# Patient Record
Sex: Female | Born: 1940
Health system: Southern US, Community
[De-identification: ages and names within clinical notes are randomized; demographics above are authoritative.]

## PROBLEM LIST (undated history)

## (undated) DIAGNOSIS — Z950 Presence of cardiac pacemaker: Secondary | ICD-10-CM

## (undated) DIAGNOSIS — I495 Sick sinus syndrome: Secondary | ICD-10-CM

## (undated) DIAGNOSIS — I10 Essential (primary) hypertension: Secondary | ICD-10-CM

## (undated) DIAGNOSIS — G4730 Sleep apnea, unspecified: Secondary | ICD-10-CM

## (undated) DIAGNOSIS — F0390 Unspecified dementia without behavioral disturbance: Secondary | ICD-10-CM

## (undated) DIAGNOSIS — K219 Gastro-esophageal reflux disease without esophagitis: Secondary | ICD-10-CM

## (undated) DIAGNOSIS — I509 Heart failure, unspecified: Secondary | ICD-10-CM

## (undated) DIAGNOSIS — I739 Peripheral vascular disease, unspecified: Secondary | ICD-10-CM

## (undated) DIAGNOSIS — J449 Chronic obstructive pulmonary disease, unspecified: Secondary | ICD-10-CM

## (undated) DIAGNOSIS — E785 Hyperlipidemia, unspecified: Secondary | ICD-10-CM

## (undated) DIAGNOSIS — C439 Malignant melanoma of skin, unspecified: Secondary | ICD-10-CM

## (undated) DIAGNOSIS — I4891 Unspecified atrial fibrillation: Secondary | ICD-10-CM

## (undated) DIAGNOSIS — E119 Type 2 diabetes mellitus without complications: Secondary | ICD-10-CM

## (undated) DIAGNOSIS — J45909 Unspecified asthma, uncomplicated: Secondary | ICD-10-CM

## (undated) DIAGNOSIS — Z89511 Acquired absence of right leg below knee: Secondary | ICD-10-CM

---

## 2004-09-12 ENCOUNTER — Ambulatory Visit (HOSPITAL_COMMUNITY): Admission: RE | Admit: 2004-09-12 | Discharge: 2004-09-12 | Payer: Self-pay | Admitting: Vascular Surgery

## 2004-10-18 ENCOUNTER — Encounter: Admission: RE | Admit: 2004-10-18 | Discharge: 2004-10-18 | Payer: Self-pay | Admitting: Vascular Surgery

## 2004-10-24 ENCOUNTER — Ambulatory Visit (HOSPITAL_COMMUNITY): Admission: RE | Admit: 2004-10-24 | Discharge: 2004-10-24 | Payer: Self-pay | Admitting: Vascular Surgery

## 2006-10-17 ENCOUNTER — Ambulatory Visit: Payer: Self-pay | Admitting: Vascular Surgery

## 2007-04-17 ENCOUNTER — Ambulatory Visit: Payer: Self-pay | Admitting: Vascular Surgery

## 2007-10-16 ENCOUNTER — Ambulatory Visit: Payer: Self-pay | Admitting: Vascular Surgery

## 2009-05-24 DIAGNOSIS — J45909 Unspecified asthma, uncomplicated: Secondary | ICD-10-CM

## 2009-05-24 DIAGNOSIS — F329 Major depressive disorder, single episode, unspecified: Secondary | ICD-10-CM

## 2009-05-24 DIAGNOSIS — G4700 Insomnia, unspecified: Secondary | ICD-10-CM

## 2009-05-24 DIAGNOSIS — M810 Age-related osteoporosis without current pathological fracture: Secondary | ICD-10-CM | POA: Insufficient documentation

## 2009-05-24 DIAGNOSIS — I739 Peripheral vascular disease, unspecified: Secondary | ICD-10-CM

## 2009-05-24 DIAGNOSIS — M1990 Unspecified osteoarthritis, unspecified site: Secondary | ICD-10-CM

## 2009-05-24 DIAGNOSIS — N183 Chronic kidney disease, stage 3 (moderate): Secondary | ICD-10-CM

## 2009-05-26 DIAGNOSIS — E119 Type 2 diabetes mellitus without complications: Secondary | ICD-10-CM

## 2009-06-01 DIAGNOSIS — R946 Abnormal results of thyroid function studies: Secondary | ICD-10-CM

## 2009-06-01 DIAGNOSIS — E559 Vitamin D deficiency, unspecified: Secondary | ICD-10-CM

## 2009-06-05 DIAGNOSIS — F411 Generalized anxiety disorder: Secondary | ICD-10-CM | POA: Insufficient documentation

## 2009-08-17 DIAGNOSIS — I150 Renovascular hypertension: Secondary | ICD-10-CM

## 2009-08-17 DIAGNOSIS — M129 Arthropathy, unspecified: Secondary | ICD-10-CM | POA: Insufficient documentation

## 2009-08-17 DIAGNOSIS — N3946 Mixed incontinence: Secondary | ICD-10-CM | POA: Insufficient documentation

## 2010-05-29 NOTE — Assessment & Plan Note (Signed)
OFFICE VISIT   [name], [name]  DOB:  [birth year]                                       10/17/2006  ZOXWR#:[id number]   Sarah Moss presents today for continued followup of diffuse  progressive occlusive disease.  She is status post prior left iliac  angioplasty, and is also status post right renal artery angioplasty and  stenting in 10/2004.  She had 1 subsequent duplex of the renal arteries  at [facility] showing no significant restenosis.  She does report that  she has occasional hypertension, but is usually under pretty good  control.  She does have continued problem with degenerative disk disease  in her back which limits her walk ability, and she is also status post  amputation on the right from trauma.   PHYSICAL EXAMINATION:  Abdomen is benign with no tenderness and no  masses.  She does have well-perfused left foot with palpable dorsalis  pedis pulse.   She underwent lower extremity noninvasive study showing AB  index at  0.92 with good wave form, biphasic in her posterior tibial and dorsalis  pedis, and I explained that this demonstrates that she does have  continued patency of her left iliac stent.  On renal duplex, she did  have a complete occlusion of her right renal artery with no flow noted  in her kidney.  She just has some mild to moderate stenosis in her left  renal artery with no critical flow disturbance.  Discussed this with Sarah Moss.  I explained that there is no treatment for occluded right  renal artery.  Her renal size prior to renal angioplasty was 9 cm and  her renal size on the right today is 8.7 cm.  She  has been scheduled to see renal for renal evaluation, and I plan to see  her again in 1 year with repeat renal artery duplex of her left kidney.   Sarah Moss, M.D.  Electronically Signed   TFE/MEDQ  D:  10/17/2006  T:  10/20/2006  Job:  [id number]   cc:   Lucianne Lei, MD

## 2010-05-29 NOTE — Procedures (Signed)
RENAL ARTERY DUPLEX EVALUATION   INDICATION:  Follow up renal artery disease.   HISTORY:  Diabetes:  Yes.  Cardiac:  No.  Hypertension:  Yes.  Smoking:  Previous.   RENAL ARTERY DUPLEX FINDINGS:  Aorta-Proximal:  43 cm/s  Aorta-Mid:  Not visualized  Aorta-Distal:  50 cm/s  Celiac Artery Origin:  Not visualized  SMA Origin:  Not visualized                                    RIGHT               LEFT  Renal Artery Origin:             Not visualized      Not visualized  Renal Artery Proximal:           Not visualized      Not visualized  Renal Artery Mid:                No flow detected    84 cm/s  Renal Artery Distal:             No flow detected    81 cm/s  Hilar Acceleration Time (AT):  Renal-Aortic Ratio (RAR):                            1.95  Kidney Size:                     8.5 cm              12.1 cm  End Diastolic Ratio (EDR):  Resistive Index (RI):            0.95                0.82   IMPRESSION:  1. No flow was adequately visualized in the right renal artery;      however, flow was noted within the right kidney via possible      collateral circulation.  2. No focal increase in Doppler velocities were detected in the      visualized portion of the left renal artery, however a left      proximal renal artery velocity of 208 cm/s was visualized on the      previous exam on 10/17/2006.  3. Elevated bilateral resistive indices are noted within the kidneys.  4. The left kidney size appears within normal limits with the right      kidney appearing atrophied.  5. Very limited visualization of the abdominal vasculature due to      extensive overlying bowel gas patterns and patient body habitus.   ___________________________________________  [name], M.D.   CH/MEDQ  D:  10/16/2007  T:  10/17/2007  Job:  [id number]

## 2010-05-29 NOTE — Assessment & Plan Note (Signed)
OFFICE VISIT   CRYSTALL, DONALDSON A  DOB:  05/30/1940                                       10/16/2007  QMVHQ#:[id number]   The patient presents today for continued followup of her diffuse  peripheral vascular occlusive disease.  She reports that she has had  back surgery since my last visit with her and has had a good result with  this.  She has had no significant symptoms of left leg claudication.  She reports that she does not know her renal function status but has had  recent reports and was not told of any worsening.  She is here today for  renal duplex.   On physical exam blood pressure is 178/100, pulse 78, respirations 18.  Her radial pulses are 2+ bilaterally.  She does have a right leg  amputation and the left leg is well-perfused.   Her renal artery duplex today reveals known occlusion of her right renal  artery.  Left renal artery shows no evidence of increased velocities  suggesting no evidence of stenosis..  I discussed this with the patient  and we will see her again in 1 year for continued followup.   [name], M.D.  Electronically Signed   TFE/MEDQ  D:  10/16/2007  T:  10/17/2007  Job:  [id number]   cc:   Lucianne Lei, MD  [facility]

## 2010-05-29 NOTE — Procedures (Signed)
RENAL ARTERY DUPLEX EVALUATION   INDICATION:  Follow up known right renal artery stent.   HISTORY:  Diabetes:  Yes.  Cardiac:  Yes.  Hypertension:  Yes.  Smoking:  Quit.   RENAL ARTERY DUPLEX FINDINGS:  Aorta-Proximal:  63 cm/s  Aorta-Mid:  87 cm/s  Aorta-Distal:  Celiac Artery Origin:  Not well visualized  SMA Origin:  Not well visualized                                    RIGHT               LEFT  Renal Artery Origin:             No flow noted       Not well visualized  Renal Artery Proximal:           No flow noted       208 cm/s  Renal Artery Mid:                No flow noted       76 cm/s  Renal Artery Distal:             No flow noted       67 cm/s  Hilar Acceleration Time (AT):  Renal-Aortic Ratio (RAR):                            3.3  Kidney Size:                     8.87 cm X 12.8 cm  End Diastolic Ratio (EDR):  Resistive Index (RI):                                0.60   IMPRESSION:  1. No flow noted in the right kidney, status post right renal artery      stent.  2. Right kidney appears atrophied, measuring 8.67 cm.  3. Less than 60% stenosis noted in the left renal artery.  4. Normal resistive index noted in the left kidney.  5. Unable to visualize celiac artery, superior mesenteric artery, and      distal aorta due to body habitus.   ___________________________________________  [name], M.D.   MG/MEDQ  D:  10/17/2006  T:  10/18/2006  Job:  [id number]

## 2010-06-01 NOTE — Op Note (Signed)
Sarah Moss, Sarah Moss              ACCOUNT NO.:  [id number]   MEDICAL RECORD NO.:  [id number]          PATIENT TYPE:  AMB   LOCATION:  SDS                          FACILITY:  [facility]   PHYSICIAN:  [name], M.D.    DATE OF BIRTH:  09/27/1940   DATE OF PROCEDURE:  10/24/2004  DATE OF DISCHARGE:                                 OPERATIVE REPORT   PREOPERATIVE DIAGNOSIS:  Severe right renal artery stenosis.   POSTOPERATIVE DIAGNOSIS:  Severe right renal artery stenosis.   PROCEDURE:  Right renal artery angioplasty and PALMAZ stent placement.   SURGEON:  [name], M.D.   ANESTHESIA:  1% lidocaine local.   COMPLICATIONS:  None.   DISPOSITION:  To recovery room, stable.   INDICATIONS FOR PROCEDURE:  The patient is 70 year old white female with  suspected renovascular hypertension.  She had undergone an MRA  preoperatively which had showed a significant stenosis in the right renal  artery.  She had also had left leg ischemia and several weeks prior to this  procedure, underwent successful left iliac artery angioplasty.  At that  procedure, the patient was found to have a severe stenosis in the right  renal artery, but appear to have possible small kidney size.  Therefore, she  underwent ultrasound as an outpatient and this revealed over a 9-cm right  kidney and a 12-cm left kidney; it was recommended that she undergo  angioplasty for preservation of renal function and for hopeful blood  pressure control.   PROCEDURE IN DETAIL:  The patient was taken to the peripheral vascular cath  lab and placed in supine position, where the area of the right groin was  prepped and draped in the usual sterile fashion.  Using local anesthesia and  a single-wall puncture, the right common femoral was entered and a guidewire  was passed up to the level of the suprarenal aorta.  A 6-French sheath was  passed over the guidewire.  Initially, a renal double-curve guide catheter  was positioned at  the level of the renal arteries and an 018 guidewire was  passed through this.  The patient had a small aorta and this did not allow  enough uncoiling of the guide catheter to access the somewhat down-pointing  right renal artery.  For this reason, this was exchanged for an IMA catheter  and the 018 was successfully passed across this.  Next, a 6-mm balloon and  15-mm-length PALMAZ stent was chosen and was positioned at the ostium of the  aorta.  This was inflated to 9 atmospheres.  Completion arteriogram revealed  excellent positioning with no evidence of residual stenosis.  The sheath was  removed after the ACT was normalized.  The patient was given 4000 units of  intravenous heparin.  The patient tolerated the procedure without immediate  complication and was transferred to the holding area in stable condition.   FINDINGS:  1.  Severe right renal artery stenosis.  2.  Successful angioplasty and PALMAZ stenting of severe right renal artery.      [name], M.D.  Electronically Signed  TFE/MEDQ  D:  10/24/2004  T:  10/24/2004  Job:  [id number]

## 2010-06-01 NOTE — Op Note (Signed)
Sarah Moss, Sarah Moss              ACCOUNT NO.:  [id number]   MEDICAL RECORD NO.:  [id number]          PATIENT TYPE:  AMB   LOCATION:  SDS                          FACILITY:  [facility]   PHYSICIAN:  [name], M.D.    DATE OF BIRTH:  25-Dec-1940   DATE OF PROCEDURE:  10/10/2004  DATE OF DISCHARGE:  09/12/2004                                 OPERATIVE REPORT   PREOPERATIVE DIAGNOSIS:  Recurrent left leg claudication symptoms.   POSTOPERATIVE DIAGNOSIS:  Recurrent left leg claudication symptoms.   PROCEDURE:  1.  Aortogram with left leg runoff.  2.  Redilatation of prior placed left common iliac artery stent.   SURGEON:  [name], M.D.   ASSISTANT:  Nurse.   ANESTHESIA:  1% lidocaine local.   COMPLICATIONS:  None.   DISPOSITION:  Holding area stable.   DESCRIPTION OF PROCEDURE:  The patient was taken peripheral vascular  catheterization lab and placed in supine position where the area both groins  were prepped and draped in usual sterile fashion. Using local anesthesia and  single wall puncture the left common femoral artery was entered. A Wholey  wire was passed up towards the bifurcation. The 5-French sheath was passed  over the guidewire. There was severe stenosis of the iliac system and a  Wholey wire was able to be positioned through the prior placed stent.  Pigtail catheter was positioned at the level of suprarenal aorta. AP  projection was undertaken. This revealed severe stenosis in the right renal  artery. No significant stenosis of the left renal artery. There was widely  patent iliac system on the right. The left room revealed a high-grade  restenosis of the stent placed in the common iliac artery just distal to the  bifurcation. A long 6-French sheath was passed to this area and the area was  redilated. Repeat study revealed excellent flow through this area. The left  leg runoff was then obtained through the same sheath revealing widely patent  superficial  femoral artery, widely patent popliteal artery, and three-vessel  distal runoff. The patient tolerated the procedure without immediate  complication and was transferred to the holding area in stable condition.      [name], M.D.  Electronically Signed     TFE/MEDQ  D:  10/10/2004  T:  10/11/2004  Job:  [id number]

## 2011-08-22 ENCOUNTER — Encounter: Payer: Self-pay | Admitting: Vascular Surgery

## 2011-10-28 DIAGNOSIS — R768 Other specified abnormal immunological findings in serum: Secondary | ICD-10-CM | POA: Insufficient documentation

## 2011-10-28 DIAGNOSIS — R7689 Other specified abnormal immunological findings in serum: Secondary | ICD-10-CM | POA: Insufficient documentation

## 2012-11-04 DIAGNOSIS — I10 Essential (primary) hypertension: Secondary | ICD-10-CM | POA: Insufficient documentation

## 2012-11-04 DIAGNOSIS — E1142 Type 2 diabetes mellitus with diabetic polyneuropathy: Secondary | ICD-10-CM | POA: Insufficient documentation

## 2012-11-04 DIAGNOSIS — E039 Hypothyroidism, unspecified: Secondary | ICD-10-CM | POA: Insufficient documentation

## 2014-05-30 DIAGNOSIS — I5032 Chronic diastolic (congestive) heart failure: Secondary | ICD-10-CM | POA: Insufficient documentation

## 2014-08-03 DIAGNOSIS — R0609 Other forms of dyspnea: Secondary | ICD-10-CM | POA: Insufficient documentation

## 2014-08-03 DIAGNOSIS — J309 Allergic rhinitis, unspecified: Secondary | ICD-10-CM | POA: Insufficient documentation

## 2014-08-03 DIAGNOSIS — G4730 Sleep apnea, unspecified: Secondary | ICD-10-CM | POA: Insufficient documentation

## 2014-08-03 DIAGNOSIS — I272 Pulmonary hypertension, unspecified: Secondary | ICD-10-CM | POA: Insufficient documentation

## 2014-09-14 DIAGNOSIS — Z7982 Long term (current) use of aspirin: Secondary | ICD-10-CM | POA: Insufficient documentation

## 2014-09-14 DIAGNOSIS — I10 Essential (primary) hypertension: Secondary | ICD-10-CM | POA: Insufficient documentation

## 2014-09-14 DIAGNOSIS — R0600 Dyspnea, unspecified: Secondary | ICD-10-CM | POA: Insufficient documentation

## 2014-10-12 DIAGNOSIS — IMO0001 Reserved for inherently not codable concepts without codable children: Secondary | ICD-10-CM | POA: Insufficient documentation

## 2014-12-19 DIAGNOSIS — I495 Sick sinus syndrome: Secondary | ICD-10-CM | POA: Insufficient documentation

## 2015-02-15 DIAGNOSIS — E118 Type 2 diabetes mellitus with unspecified complications: Secondary | ICD-10-CM | POA: Insufficient documentation

## 2015-02-20 DIAGNOSIS — L509 Urticaria, unspecified: Secondary | ICD-10-CM | POA: Insufficient documentation

## 2015-02-20 DIAGNOSIS — Z950 Presence of cardiac pacemaker: Secondary | ICD-10-CM | POA: Insufficient documentation

## 2015-02-21 DIAGNOSIS — Z9889 Other specified postprocedural states: Secondary | ICD-10-CM | POA: Insufficient documentation

## 2015-02-24 DIAGNOSIS — J90 Pleural effusion, not elsewhere classified: Secondary | ICD-10-CM | POA: Insufficient documentation

## 2016-03-05 ENCOUNTER — Encounter (HOSPITAL_COMMUNITY): Payer: Self-pay | Admitting: *Deleted

## 2016-03-05 ENCOUNTER — Emergency Department (HOSPITAL_COMMUNITY)
Admission: EM | Admit: 2016-03-05 | Discharge: 2016-03-05 | Disposition: A | Discharge status: Home / Self Care | Payer: Medicare HMO | Attending: Emergency Medicine | Admitting: Emergency Medicine

## 2016-03-05 ENCOUNTER — Emergency Department (HOSPITAL_COMMUNITY): Payer: Medicare HMO

## 2016-03-05 DIAGNOSIS — Z7984 Long term (current) use of oral hypoglycemic drugs: Secondary | ICD-10-CM | POA: Insufficient documentation

## 2016-03-05 DIAGNOSIS — J449 Chronic obstructive pulmonary disease, unspecified: Secondary | ICD-10-CM | POA: Insufficient documentation

## 2016-03-05 DIAGNOSIS — R0600 Dyspnea, unspecified: Secondary | ICD-10-CM | POA: Diagnosis present

## 2016-03-05 DIAGNOSIS — Z7901 Long term (current) use of anticoagulants: Secondary | ICD-10-CM | POA: Diagnosis not present

## 2016-03-05 DIAGNOSIS — I110 Hypertensive heart disease with heart failure: Secondary | ICD-10-CM | POA: Diagnosis not present

## 2016-03-05 DIAGNOSIS — Z87891 Personal history of nicotine dependence: Secondary | ICD-10-CM | POA: Diagnosis not present

## 2016-03-05 DIAGNOSIS — R600 Localized edema: Secondary | ICD-10-CM | POA: Insufficient documentation

## 2016-03-05 DIAGNOSIS — E119 Type 2 diabetes mellitus without complications: Secondary | ICD-10-CM | POA: Diagnosis not present

## 2016-03-05 DIAGNOSIS — I509 Heart failure, unspecified: Secondary | ICD-10-CM | POA: Insufficient documentation

## 2016-03-05 HISTORY — DX: Chronic obstructive pulmonary disease, unspecified: J44.9

## 2016-03-05 HISTORY — DX: Essential (primary) hypertension: I10

## 2016-03-05 HISTORY — DX: Unspecified asthma, uncomplicated: J45.909

## 2016-03-05 HISTORY — DX: Gastro-esophageal reflux disease without esophagitis: K21.9

## 2016-03-05 HISTORY — DX: Heart failure, unspecified: I50.9

## 2016-03-05 HISTORY — DX: Type 2 diabetes mellitus without complications: E11.9

## 2016-03-05 LAB — CBC WITH DIFFERENTIAL/PLATELET
Basophils Absolute: 0 10*3/uL (ref 0.0–0.1)
Basophils Relative: 1 %
Eosinophils Absolute: 0.1 10*3/uL (ref 0.0–0.7)
Eosinophils Relative: 2 %
HCT: 31 % — ABNORMAL LOW (ref 36.0–46.0)
Hemoglobin: 9.6 g/dL — ABNORMAL LOW (ref 12.0–15.0)
Lymphocytes Relative: 22 %
Lymphs Abs: 1.4 10*3/uL (ref 0.7–4.0)
MCH: 21.7 pg — ABNORMAL LOW (ref 26.0–34.0)
MCHC: 31 g/dL (ref 30.0–36.0)
MCV: 70.1 fL — ABNORMAL LOW (ref 78.0–100.0)
Monocytes Absolute: 0.9 10*3/uL (ref 0.1–1.0)
Monocytes Relative: 13 %
Neutro Abs: 4.1 10*3/uL (ref 1.7–7.7)
Neutrophils Relative %: 63 %
Platelets: 193 10*3/uL (ref 150–400)
RBC: 4.42 MIL/uL (ref 3.87–5.11)
RDW: 19 % — ABNORMAL HIGH (ref 11.5–15.5)
WBC: 6.6 10*3/uL (ref 4.0–10.5)

## 2016-03-05 LAB — URINALYSIS, MICROSCOPIC (REFLEX)

## 2016-03-05 LAB — URINALYSIS, ROUTINE W REFLEX MICROSCOPIC
Bilirubin Urine: NEGATIVE
Glucose, UA: NEGATIVE mg/dL
Ketones, ur: NEGATIVE mg/dL
Nitrite: NEGATIVE
Protein, ur: 30 mg/dL — AB
Specific Gravity, Urine: 1.02 (ref 1.005–1.030)
pH: 6 (ref 5.0–8.0)

## 2016-03-05 LAB — COMPREHENSIVE METABOLIC PANEL
ALT: 13 U/L — ABNORMAL LOW (ref 14–54)
AST: 25 U/L (ref 15–41)
Albumin: 3.3 g/dL — ABNORMAL LOW (ref 3.5–5.0)
Alkaline Phosphatase: 56 U/L (ref 38–126)
Anion gap: 13 (ref 5–15)
BUN: 20 mg/dL (ref 6–20)
CO2: 22 mmol/L (ref 22–32)
Calcium: 9.2 mg/dL (ref 8.9–10.3)
Chloride: 106 mmol/L (ref 101–111)
Creatinine, Ser: 0.97 mg/dL (ref 0.44–1.00)
GFR calc Af Amer: >60 mL/min (ref >60)
GFR calc non Af Amer: 55 mL/min — ABNORMAL LOW (ref >60)
Glucose, Bld: 130 mg/dL — ABNORMAL HIGH (ref 65–99)
Potassium: 5.4 mmol/L — ABNORMAL HIGH (ref 3.5–5.1)
Sodium: 141 mmol/L (ref 135–145)
Total Bilirubin: 0.6 mg/dL (ref 0.3–1.2)
Total Protein: 6.2 g/dL — ABNORMAL LOW (ref 6.5–8.1)

## 2016-03-05 LAB — BRAIN NATRIURETIC PEPTIDE: B Natriuretic Peptide: 842 pg/mL — ABNORMAL HIGH (ref 0.0–100.0)

## 2016-03-05 LAB — PROTIME-INR
INR: 2.84
Prothrombin Time: 30.4 seconds — ABNORMAL HIGH (ref 11.4–15.2)

## 2016-03-05 LAB — LIPASE, BLOOD: Lipase: 41 U/L (ref 11–51)

## 2016-03-05 LAB — TROPONIN I: Troponin I: <0.03 ng/mL (ref <0.03)

## 2016-03-05 MED ORDER — ALBUTEROL SULFATE (2.5 MG/3ML) 0.083% IN NEBU
2.5000 mg | INHALATION_SOLUTION | Freq: Once | RESPIRATORY_TRACT | Status: AC
Start: 2016-03-05 — End: 2016-03-05
  Administered 2016-03-05: 2.5 mg via RESPIRATORY_TRACT
  Filled 2016-03-05: qty 3

## 2016-03-05 MED ORDER — FUROSEMIDE 10 MG/ML IJ SOLN
60.0000 mg | Freq: Once | INTRAMUSCULAR | Status: AC
  Administered 2016-03-05: 60 mg via INTRAVENOUS
  Filled 2016-03-05: qty 6

## 2016-03-05 NOTE — ED Notes (Signed)
Patient family requesting to speak with social work. Put back in process

## 2016-03-05 NOTE — Discharge Instructions (Addendum)
Increase lasix to 40mg  twice a day for the next 3 days.

## 2016-03-05 NOTE — ED Notes (Signed)
Patient at xray

## 2016-03-05 NOTE — ED Triage Notes (Signed)
Patient comes in per [facility] with SOB. Fm home. sob for about 1wk. 2 days ago patient felt chest pain. No pain today. Sob with exertion. On 2L O2 for comfort. 96% on RA. ekg paced rhythm. Ems v/s 138/64, HR 76, 20 RR. Hx of DM, asthma, anxiety, ckd, chf, htn, pacemaker. No IV per EMS.

## 2016-03-05 NOTE — ED Notes (Signed)
Papers reviewed with patient and friend at bedside. They verbalize understanding
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
## 2016-03-05 NOTE — ED Provider Notes (Signed)
[place] DEPT Provider Note   CSN: [id number] Arrival date & time: 03/05/16  0710     History   Chief Complaint No chief complaint on file.   HPI Sarah Moss is a 76 y.o. female.  HPI    76 y.o. F with HTN, DM, A-fib with tachy brady s/p PPM by [facility], diastolic HF, COPD and right BKA from an accident presenting with dyspnea. History primarily from her sister whom she lives with. Worsening dyspnea over the past ~5 days. Increasing orthopnea. Decreasing functional capacity.  Her sister says her face looks puffy and her abdomen seems bloated. The patient has been complaining of epigastric/lower sternal pain for the past several days. She has a hard to describing it further but does says it's been constant. She is anticoagulated of warfarin. She denies BRBPR or melena. She denies hx of PUD. Does report "reflux" but not on a PPI.   She has had numerous previous admission for dyspnea, mostly related to HF. She previously lived in [place] until moving in with her sister and most prior care has been through [place]. Cardiology through [place].  There have been compliance problems previously and social concerns. Her sister is currently in charge of administering her medications and reports compliance.     Past Medical History:  Diagnosis Date  . Asthma   . CHF (congestive heart failure) ([place])   . COPD (chronic obstructive pulmonary disease) ([place])   . Diabetes mellitus without complication ([place])   . GERD (gastroesophageal reflux disease)   . Hypertension     There are no active problems to display for this patient.   No past surgical history on file.  OB History    No data available       Home Medications    Prior to Admission medications   Medication Sig Start Date End Date Taking? Authorizing Provider  ALPRAZolam (XANAX) 0.25 MG tablet Take 0.25 mg by mouth every 12 (twelve) hours as needed for anxiety.   Yes Historical Provider, MD    atenolol (TENORMIN) 25 MG tablet Take 25 mg by mouth daily. 02/22/15  Yes Historical Provider, MD  atorvastatin (LIPITOR) 40 MG tablet Take 40 mg by mouth daily. 05/30/14  Yes Historical Provider, MD  diltiazem (TIAZAC) 120 MG 24 hr capsule Take 120 mg by mouth daily. 02/22/15  Yes Historical Provider, MD  DULoxetine (CYMBALTA) 60 MG capsule Take 60 mg by mouth daily.   Yes Historical Provider, MD  furosemide (LASIX) 40 MG tablet Take 40 mg by mouth daily.   Yes Historical Provider, MD  gabapentin (NEURONTIN) 300 MG capsule Take 300 mg by mouth at bedtime. 05/30/14  Yes Historical Provider, MD  levothyroxine (SYNTHROID, LEVOTHROID) 25 MCG tablet Take 25 mcg by mouth daily. 05/30/14  Yes Historical Provider, MD  lisinopril (PRINIVIL,ZESTRIL) 20 MG tablet Take 20 mg by mouth daily.   Yes Historical Provider, MD  metFORMIN (GLUCOPHAGE) 1000 MG tablet Take 1,000 mg by mouth 2 (two) times daily. 05/30/14  Yes Historical Provider, MD  montelukast (SINGULAIR) 10 MG tablet Take 10 mg by mouth at bedtime.   Yes Historical Provider, MD  omeprazole (PRILOSEC) 40 MG capsule Take 40 mg by mouth daily.   Yes Historical Provider, MD  potassium chloride (K-DUR) 10 MEQ tablet Take 10 mEq by mouth 2 (two) times daily. For 30 days 02/12/16  Yes Historical Provider, MD  warfarin (COUMADIN) 4 MG tablet Take 4-6 mg by mouth as directed. Per Warfarin sheet 07/04/15 07/03/16  Yes Historical Provider, MD  nitroGLYCERIN (NITROSTAT) 0.4 MG SL tablet Place 0.4 mg under the tongue every 5 (five) minutes as needed for chest pain.    Historical Provider, MD    Family History No family history on file.  Social History Social History  Substance Use Topics  . Smoking status: Former Research scientist (life sciences)  . Smokeless tobacco: Never Used  . Alcohol use Not on file     Allergies   Codeine; Ambien [zolpidem tartrate]; and Ambien  [zolpidem]   Review of Systems Review of Systems  All systems reviewed and negative, other than as noted in  HPI.   Physical Exam Updated Vital Signs BP 147/82   Pulse 79   Temp 98.3 F (36.8 C) (Oral)   Resp 17   Ht 5\' 5"  (1.651 m)   Wt 142 lb (64.4 kg)   SpO2 99%   BMI 23.63 kg/m   Physical Exam  Constitutional: She appears well-developed and well-nourished. No distress.  HENT:  Head: Normocephalic and atraumatic.  Eyes: Conjunctivae are normal. Right eye exhibits no discharge. Left eye exhibits no discharge.  Neck: Neck supple.  Cardiovascular: Normal rate and regular rhythm.  Exam reveals no gallop and no friction rub.   Murmur heard. Systolic murmur  Pulmonary/Chest: Effort normal and breath sounds normal. No respiratory distress.  Abdominal: Soft. She exhibits no distension. There is no tenderness.  Musculoskeletal: She exhibits edema. She exhibits no tenderness.  R BKA  Neurological: She is alert.  Skin: Skin is warm and dry.  Psychiatric: She has a normal mood and affect. Her behavior is normal. Thought content normal.  Nursing note and vitals reviewed.    ED Treatments / Results  Labs (all labs ordered are listed, but only abnormal results are displayed) Labs Reviewed  CBC WITH DIFFERENTIAL/PLATELET - Abnormal; Notable for the following:       Result Value   Hemoglobin 9.6 (*)    HCT 31.0 (*)    MCV 70.1 (*)    MCH 21.7 (*)    RDW 19.0 (*)    All other components within normal limits  BRAIN NATRIURETIC PEPTIDE - Abnormal; Notable for the following:    B Natriuretic Peptide 842.0 (*)    All other components within normal limits  URINALYSIS, ROUTINE W REFLEX MICROSCOPIC - Abnormal; Notable for the following:    APPearance HAZY (*)    Hgb urine dipstick TRACE (*)    Protein, ur 30 (*)    Leukocytes, UA SMALL (*)    All other components within normal limits  COMPREHENSIVE METABOLIC PANEL - Abnormal; Notable for the following:    Potassium 5.4 (*)    Glucose, Bld 130 (*)    Total Protein 6.2 (*)    Albumin 3.3 (*)    ALT 13 (*)    GFR calc non Af Amer 55  (*)    All other components within normal limits  PROTIME-INR - Abnormal; Notable for the following:    Prothrombin Time 30.4 (*)    All other components within normal limits  URINALYSIS, MICROSCOPIC (REFLEX) - Abnormal; Notable for the following:    Bacteria, UA FEW (*)    Squamous Epithelial / LPF 6-30 (*)    All other components within normal limits  TROPONIN I  LIPASE, BLOOD    EKG  EKG Interpretation  Date/Time:  Tuesday March 05 2016 07:33:05 EST Ventricular Rate:  80 PR Interval:    QRS Duration: 131 QT Interval:  426 QTC Calculation: 492 R  Axis:   177 Text Interpretation:  Likely paced versus accelerated junctional rhythm intraventricular conduction delay Inferolateral infarct, old No old tracing to compare Confirmed by Suheily Birks  MD, Iyona Pehrson [phone]) on 03/05/2016 11:49:36 AM       Radiology Dg Chest 2 View  Result Date: 03/05/2016 CLINICAL DATA:  76 year old female with a 1 week history of shortness of breath and chest pain which is worse on exertion EXAM: CHEST  2 VIEW COMPARISON:  Prior chest x-ray 12/14/2014 FINDINGS: Interval placement of a left subclavian approach cardiac rhythm maintenance device compared to prior imaging. This is a 2 lead device with leads projecting over the right atrium and right ventricle. Stable mild cardiomegaly and mediastinal contours. Atherosclerotic calcifications present in the transverse aorta. No evidence of pulmonary edema, new focal airspace consolidation or pneumothorax. There are small bilateral pleural effusions. Stable chronic bronchitic change and mild interstitial prominence. No acute osseous abnormality. IMPRESSION: 1. Small bilateral pleural effusions which are new compared to 12/14/2014. 2. Stable cardiomegaly without pulmonary edema. 3.  Aortic Atherosclerosis (ICD10-170.0) Electronically Signed   By: Jacqulynn Cadet M.D.   On: 03/05/2016 08:18    Procedures Procedures (including critical care time)  Medications Ordered in  ED Medications - No data to display   Initial Impression / Assessment and Plan / ED Course  I have reviewed the triage vital signs and the nursing notes.  Pertinent labs & imaging results that were available during my care of the patient were reviewed by me and considered in my medical decision making (see chart for details).  75yF with dyspnea. Likely multifactorial. Hx of COPD and given neb although I didn't appreciate wheezing. No overt edema or consolidation on CXR. BNP is elevated.  Pt's sister reports some increased   Facial puffiness and abdominal bloating. Will increase her lasix for a couple days. Compliance has been issue in the past but sister says she is in charge of meds now. Therapeutic INR today supports this.   She is complaining of some epigastric/lower sternal pain. Very atypical for ACS. Normal troponin with this pain being pretty constant for several days. Minimal tenderness on exam. I doubt significant intraabdominal pathology. Anemic but appears chronic and last hemoglobin I could review through [place] was in 10s. Pt's sister requesting social work to speak with them concerning assistance in possible transition to an assisted living facility.   Pt ambulated prior to DC.   "Pt was able to ambulate in hallway with no assistance. Pt O2 stayed at 98% while her pulse fluctuated in the low 80's. Pt was walking very good in the beginning and as she walked more she started to feel more and more short of breath and increasing her respirations, O2 still remained at 98% the whole time. Pt back in room and placed back on monitor at this time."  Final Clinical Impressions(s) / ED Diagnoses   Final diagnoses:  Dyspnea, unspecified type    New Prescriptions New Prescriptions   No medications on file     Virgel Manifold, MD 03/21/16 1536

## 2016-03-05 NOTE — ED Notes (Signed)
Patient aware we need urine sample. Unable to provide specimen at this time.

## 2016-03-05 NOTE — ED Notes (Addendum)
Pt was able to ambulate in hallway with no assistance. Pt O2 stayed at 98% while her pulse fluctuated in the low 80's. Pt was walking very good in the beginning and as she walked more she started to feel more and more short of breath and increasing her respirations, O2 still remained at 98% the whole time. Pt back in room and placed back on monitor at this time.

## 2016-03-05 NOTE — ED Notes (Signed)
Papers reviewed with patient and family member. They stated that they understand the follow up instructions and what was discussed with social work and case management

## 2016-03-18 ENCOUNTER — Inpatient Hospital Stay (HOSPITAL_COMMUNITY)
Admission: EM | Admit: 2016-03-18 | Discharge: 2016-03-23 | DRG: 252 | Disposition: A | Discharge status: Skilled Nursing Facility | Payer: Medicare HMO | Attending: Internal Medicine | Admitting: Internal Medicine

## 2016-03-18 ENCOUNTER — Encounter (HOSPITAL_COMMUNITY): Payer: Self-pay | Admitting: Emergency Medicine

## 2016-03-18 ENCOUNTER — Emergency Department (HOSPITAL_COMMUNITY)
Admit: 2016-03-18 | Discharge: 2016-03-18 | Disposition: A | Discharge status: Home / Self Care | Payer: Medicare HMO | Attending: Internal Medicine | Admitting: Internal Medicine

## 2016-03-18 ENCOUNTER — Emergency Department (HOSPITAL_COMMUNITY): Payer: Medicare HMO

## 2016-03-18 DIAGNOSIS — I739 Peripheral vascular disease, unspecified: Secondary | ICD-10-CM | POA: Diagnosis not present

## 2016-03-18 DIAGNOSIS — Z7984 Long term (current) use of oral hypoglycemic drugs: Secondary | ICD-10-CM | POA: Diagnosis not present

## 2016-03-18 DIAGNOSIS — Z89511 Acquired absence of right leg below knee: Secondary | ICD-10-CM

## 2016-03-18 DIAGNOSIS — Z833 Family history of diabetes mellitus: Secondary | ICD-10-CM | POA: Diagnosis not present

## 2016-03-18 DIAGNOSIS — I5023 Acute on chronic systolic (congestive) heart failure: Secondary | ICD-10-CM | POA: Diagnosis present

## 2016-03-18 DIAGNOSIS — I420 Dilated cardiomyopathy: Secondary | ICD-10-CM | POA: Diagnosis not present

## 2016-03-18 DIAGNOSIS — K219 Gastro-esophageal reflux disease without esophagitis: Secondary | ICD-10-CM | POA: Diagnosis present

## 2016-03-18 DIAGNOSIS — Z95820 Peripheral vascular angioplasty status with implants and grafts: Secondary | ICD-10-CM

## 2016-03-18 DIAGNOSIS — Z7901 Long term (current) use of anticoagulants: Secondary | ICD-10-CM

## 2016-03-18 DIAGNOSIS — I745 Embolism and thrombosis of iliac artery: Secondary | ICD-10-CM | POA: Diagnosis present

## 2016-03-18 DIAGNOSIS — D509 Iron deficiency anemia, unspecified: Secondary | ICD-10-CM | POA: Diagnosis present

## 2016-03-18 DIAGNOSIS — I5043 Acute on chronic combined systolic (congestive) and diastolic (congestive) heart failure: Secondary | ICD-10-CM | POA: Diagnosis not present

## 2016-03-18 DIAGNOSIS — M79609 Pain in unspecified limb: Secondary | ICD-10-CM

## 2016-03-18 DIAGNOSIS — E039 Hypothyroidism, unspecified: Secondary | ICD-10-CM | POA: Diagnosis present

## 2016-03-18 DIAGNOSIS — T82856A Stenosis of peripheral vascular stent, initial encounter: Secondary | ICD-10-CM | POA: Diagnosis present

## 2016-03-18 DIAGNOSIS — I272 Pulmonary hypertension, unspecified: Secondary | ICD-10-CM | POA: Diagnosis present

## 2016-03-18 DIAGNOSIS — Z89611 Acquired absence of right leg above knee: Secondary | ICD-10-CM

## 2016-03-18 DIAGNOSIS — Z9119 Patient's noncompliance with other medical treatment and regimen: Secondary | ICD-10-CM

## 2016-03-18 DIAGNOSIS — R791 Abnormal coagulation profile: Secondary | ICD-10-CM | POA: Diagnosis present

## 2016-03-18 DIAGNOSIS — Z89519 Acquired absence of unspecified leg below knee: Secondary | ICD-10-CM

## 2016-03-18 DIAGNOSIS — E119 Type 2 diabetes mellitus without complications: Secondary | ICD-10-CM | POA: Diagnosis not present

## 2016-03-18 DIAGNOSIS — Z87891 Personal history of nicotine dependence: Secondary | ICD-10-CM

## 2016-03-18 DIAGNOSIS — I701 Atherosclerosis of renal artery: Secondary | ICD-10-CM | POA: Diagnosis present

## 2016-03-18 DIAGNOSIS — S88119A Complete traumatic amputation at level between knee and ankle, unspecified lower leg, initial encounter: Secondary | ICD-10-CM

## 2016-03-18 DIAGNOSIS — J410 Simple chronic bronchitis: Secondary | ICD-10-CM | POA: Diagnosis not present

## 2016-03-18 DIAGNOSIS — I429 Cardiomyopathy, unspecified: Secondary | ICD-10-CM

## 2016-03-18 DIAGNOSIS — F419 Anxiety disorder, unspecified: Secondary | ICD-10-CM | POA: Diagnosis present

## 2016-03-18 DIAGNOSIS — Z823 Family history of stroke: Secondary | ICD-10-CM

## 2016-03-18 DIAGNOSIS — J449 Chronic obstructive pulmonary disease, unspecified: Secondary | ICD-10-CM | POA: Diagnosis present

## 2016-03-18 DIAGNOSIS — R0902 Hypoxemia: Secondary | ICD-10-CM

## 2016-03-18 DIAGNOSIS — I4819 Other persistent atrial fibrillation: Secondary | ICD-10-CM

## 2016-03-18 DIAGNOSIS — I70202 Unspecified atherosclerosis of native arteries of extremities, left leg: Secondary | ICD-10-CM | POA: Diagnosis present

## 2016-03-18 DIAGNOSIS — R0602 Shortness of breath: Secondary | ICD-10-CM

## 2016-03-18 DIAGNOSIS — I70245 Atherosclerosis of native arteries of left leg with ulceration of other part of foot: Secondary | ICD-10-CM | POA: Diagnosis not present

## 2016-03-18 DIAGNOSIS — Z7982 Long term (current) use of aspirin: Secondary | ICD-10-CM

## 2016-03-18 DIAGNOSIS — I482 Chronic atrial fibrillation, unspecified: Secondary | ICD-10-CM | POA: Diagnosis present

## 2016-03-18 DIAGNOSIS — Z950 Presence of cardiac pacemaker: Secondary | ICD-10-CM

## 2016-03-18 DIAGNOSIS — I509 Heart failure, unspecified: Secondary | ICD-10-CM

## 2016-03-18 DIAGNOSIS — I5021 Acute systolic (congestive) heart failure: Secondary | ICD-10-CM | POA: Diagnosis not present

## 2016-03-18 DIAGNOSIS — I4891 Unspecified atrial fibrillation: Secondary | ICD-10-CM | POA: Diagnosis present

## 2016-03-18 DIAGNOSIS — E1151 Type 2 diabetes mellitus with diabetic peripheral angiopathy without gangrene: Principal | ICD-10-CM | POA: Diagnosis present

## 2016-03-18 DIAGNOSIS — E11621 Type 2 diabetes mellitus with foot ulcer: Secondary | ICD-10-CM | POA: Diagnosis present

## 2016-03-18 DIAGNOSIS — Y832 Surgical operation with anastomosis, bypass or graft as the cause of abnormal reaction of the patient, or of later complication, without mention of misadventure at the time of the procedure: Secondary | ICD-10-CM | POA: Diagnosis present

## 2016-03-18 DIAGNOSIS — J439 Emphysema, unspecified: Secondary | ICD-10-CM | POA: Diagnosis present

## 2016-03-18 DIAGNOSIS — R0600 Dyspnea, unspecified: Secondary | ICD-10-CM | POA: Diagnosis not present

## 2016-03-18 DIAGNOSIS — I2510 Atherosclerotic heart disease of native coronary artery without angina pectoris: Secondary | ICD-10-CM | POA: Diagnosis present

## 2016-03-18 DIAGNOSIS — I481 Persistent atrial fibrillation: Secondary | ICD-10-CM | POA: Diagnosis not present

## 2016-03-18 DIAGNOSIS — I110 Hypertensive heart disease with heart failure: Secondary | ICD-10-CM | POA: Diagnosis present

## 2016-03-18 DIAGNOSIS — I708 Atherosclerosis of other arteries: Secondary | ICD-10-CM | POA: Diagnosis present

## 2016-03-18 DIAGNOSIS — Z01810 Encounter for preprocedural cardiovascular examination: Secondary | ICD-10-CM | POA: Diagnosis not present

## 2016-03-18 DIAGNOSIS — I998 Other disorder of circulatory system: Secondary | ICD-10-CM | POA: Diagnosis not present

## 2016-03-18 HISTORY — DX: Acquired absence of right leg below knee: Z89.511

## 2016-03-18 LAB — PROTIME-INR
INR: 2.44
Prothrombin Time: 26.9 seconds — ABNORMAL HIGH (ref 11.4–15.2)

## 2016-03-18 LAB — CBC WITH DIFFERENTIAL/PLATELET
BASOS ABS: 0 10*3/uL (ref 0.0–0.1)
Basophils Relative: 0 %
Eosinophils Absolute: 0.2 10*3/uL (ref 0.0–0.7)
Eosinophils Relative: 2 %
HEMATOCRIT: 31.9 % — AB (ref 36.0–46.0)
HEMOGLOBIN: 9.8 g/dL — AB (ref 12.0–15.0)
LYMPHS ABS: 1.8 10*3/uL (ref 0.7–4.0)
Lymphocytes Relative: 22 %
MCH: 21.2 pg — ABNORMAL LOW (ref 26.0–34.0)
MCHC: 30.7 g/dL (ref 30.0–36.0)
MCV: 69 fL — ABNORMAL LOW (ref 78.0–100.0)
MONOS PCT: 13 %
Monocytes Absolute: 1 10*3/uL (ref 0.1–1.0)
NEUTROS PCT: 63 %
Neutro Abs: 5 10*3/uL (ref 1.7–7.7)
Platelets: 265 10*3/uL (ref 150–400)
RBC: 4.62 MIL/uL (ref 3.87–5.11)
RDW: 19.3 % — AB (ref 11.5–15.5)
WBC: 8 10*3/uL (ref 4.0–10.5)

## 2016-03-18 LAB — COMPREHENSIVE METABOLIC PANEL
ALBUMIN: 3.8 g/dL (ref 3.5–5.0)
ALT: 17 U/L (ref 14–54)
AST: 41 U/L (ref 15–41)
Alkaline Phosphatase: 75 U/L (ref 38–126)
Anion gap: 11 (ref 5–15)
BILIRUBIN TOTAL: 1 mg/dL (ref 0.3–1.2)
BUN: 27 mg/dL — AB (ref 6–20)
CHLORIDE: 106 mmol/L (ref 101–111)
CO2: 22 mmol/L (ref 22–32)
Calcium: 9 mg/dL (ref 8.9–10.3)
Creatinine, Ser: 1.11 mg/dL — ABNORMAL HIGH (ref 0.44–1.00)
GFR calc Af Amer: 54 mL/min — ABNORMAL LOW (ref >60)
GFR calc non Af Amer: 47 mL/min — ABNORMAL LOW (ref >60)
Glucose, Bld: 137 mg/dL — ABNORMAL HIGH (ref 65–99)
POTASSIUM: 4.3 mmol/L (ref 3.5–5.1)
Sodium: 139 mmol/L (ref 135–145)
TOTAL PROTEIN: 7.5 g/dL (ref 6.5–8.1)

## 2016-03-18 LAB — IRON AND TIBC
IRON: 14 ug/dL — AB (ref 28–170)
Saturation Ratios: 3 % — ABNORMAL LOW (ref 10.4–31.8)
TIBC: 546 ug/dL — ABNORMAL HIGH (ref 250–450)
UIBC: 532 ug/dL

## 2016-03-18 LAB — URINALYSIS, ROUTINE W REFLEX MICROSCOPIC
Bilirubin Urine: NEGATIVE
Glucose, UA: NEGATIVE mg/dL
Hgb urine dipstick: NEGATIVE
Ketones, ur: NEGATIVE mg/dL
LEUKOCYTES UA: NEGATIVE
NITRITE: NEGATIVE
PH: 5 (ref 5.0–8.0)
Protein, ur: NEGATIVE mg/dL
SPECIFIC GRAVITY, URINE: 1.009 (ref 1.005–1.030)

## 2016-03-18 LAB — GLUCOSE, CAPILLARY: Glucose-Capillary: 226 mg/dL — ABNORMAL HIGH (ref 65–99)

## 2016-03-18 LAB — I-STAT TROPONIN, ED: Troponin i, poc: 0.02 ng/mL (ref 0.00–0.08)

## 2016-03-18 LAB — TSH: TSH: 4.251 u[IU]/mL (ref 0.350–4.500)

## 2016-03-18 LAB — URIC ACID: Uric Acid, Serum: 11.7 mg/dL — ABNORMAL HIGH (ref 2.3–6.6)

## 2016-03-18 LAB — BRAIN NATRIURETIC PEPTIDE: B NATRIURETIC PEPTIDE 5: 765.5 pg/mL — AB (ref 0.0–100.0)

## 2016-03-18 LAB — RETICULOCYTES
RBC.: 4.59 MIL/uL (ref 3.87–5.11)
RETIC COUNT ABSOLUTE: 96.4 10*3/uL (ref 19.0–186.0)
Retic Ct Pct: 2.1 % (ref 0.4–3.1)

## 2016-03-18 LAB — MAGNESIUM: Magnesium: 1.5 mg/dL — ABNORMAL LOW (ref 1.7–2.4)

## 2016-03-18 LAB — FOLATE: FOLATE: 22.1 ng/mL (ref >5.9)

## 2016-03-18 LAB — CBG MONITORING, ED: Glucose-Capillary: 125 mg/dL — ABNORMAL HIGH (ref 65–99)

## 2016-03-18 LAB — VITAMIN B12: Vitamin B-12: 307 pg/mL (ref 180–914)

## 2016-03-18 LAB — FERRITIN: Ferritin: 10 ng/mL — ABNORMAL LOW (ref 11–307)

## 2016-03-18 LAB — T4, FREE: FREE T4: 1.4 ng/dL — AB (ref 0.61–1.12)

## 2016-03-18 MED ORDER — WARFARIN - PHARMACIST DOSING INPATIENT: Freq: Every day | Status: DC

## 2016-03-18 MED ORDER — GABAPENTIN 300 MG PO CAPS
300.0000 mg | ORAL_CAPSULE | Freq: Every day | ORAL | Status: DC
  Administered 2016-03-18 – 2016-03-22 (×5): 300 mg via ORAL
  Filled 2016-03-18 (×5): qty 1

## 2016-03-18 MED ORDER — ASPIRIN EC 81 MG PO TBEC
81.0000 mg | DELAYED_RELEASE_TABLET | Freq: Every day | ORAL | Status: DC
  Administered 2016-03-19 – 2016-03-23 (×5): 81 mg via ORAL
  Filled 2016-03-18 (×5): qty 1

## 2016-03-18 MED ORDER — ALBUTEROL SULFATE (2.5 MG/3ML) 0.083% IN NEBU
3.0000 mL | INHALATION_SOLUTION | Freq: Four times a day (QID) | RESPIRATORY_TRACT | Status: DC | PRN
  Administered 2016-03-20: 3 mL via RESPIRATORY_TRACT
  Filled 2016-03-18: qty 3

## 2016-03-18 MED ORDER — LEVOTHYROXINE SODIUM 25 MCG PO TABS
25.0000 ug | ORAL_TABLET | Freq: Every day | ORAL | Status: DC
  Administered 2016-03-19 – 2016-03-23 (×5): 25 ug via ORAL
  Filled 2016-03-18 (×5): qty 1

## 2016-03-18 MED ORDER — FUROSEMIDE 10 MG/ML IJ SOLN
40.0000 mg | Freq: Once | INTRAMUSCULAR | Status: AC
  Administered 2016-03-18: 40 mg via INTRAVENOUS
  Filled 2016-03-18: qty 4

## 2016-03-18 MED ORDER — WARFARIN SODIUM 3 MG PO TABS
6.0000 mg | ORAL_TABLET | Freq: Once | ORAL | Status: DC
  Filled 2016-03-18: qty 1

## 2016-03-18 MED ORDER — DULOXETINE HCL 60 MG PO CPEP
60.0000 mg | ORAL_CAPSULE | Freq: Every day | ORAL | Status: DC
Start: 2016-03-18 — End: 2016-03-23
  Administered 2016-03-18 – 2016-03-23 (×6): 60 mg via ORAL
  Filled 2016-03-18 (×6): qty 1

## 2016-03-18 MED ORDER — ATORVASTATIN CALCIUM 40 MG PO TABS
40.0000 mg | ORAL_TABLET | Freq: Every day | ORAL | Status: DC
  Administered 2016-03-18 – 2016-03-22 (×4): 40 mg via ORAL
  Filled 2016-03-18 (×4): qty 1

## 2016-03-18 MED ORDER — SODIUM CHLORIDE 0.9 % IV SOLN: 250.0000 mL | INTRAVENOUS | Status: DC | PRN

## 2016-03-18 MED ORDER — ALPRAZOLAM 0.25 MG PO TABS
0.2500 mg | ORAL_TABLET | Freq: Two times a day (BID) | ORAL | Status: DC | PRN
  Administered 2016-03-19 – 2016-03-22 (×2): 0.25 mg via ORAL
  Filled 2016-03-18 (×2): qty 1

## 2016-03-18 MED ORDER — PANTOPRAZOLE SODIUM 40 MG PO TBEC
40.0000 mg | DELAYED_RELEASE_TABLET | Freq: Every day | ORAL | Status: DC
Start: 2016-03-18 — End: 2016-03-23
  Administered 2016-03-18 – 2016-03-23 (×6): 40 mg via ORAL
  Filled 2016-03-18 (×6): qty 1

## 2016-03-18 MED ORDER — LISINOPRIL 20 MG PO TABS
20.0000 mg | ORAL_TABLET | Freq: Every day | ORAL | Status: DC
  Administered 2016-03-19 – 2016-03-23 (×5): 20 mg via ORAL
  Filled 2016-03-18 (×5): qty 1

## 2016-03-18 MED ORDER — ONDANSETRON HCL 4 MG/2ML IJ SOLN: 4.0000 mg | Freq: Four times a day (QID) | INTRAMUSCULAR | Status: DC | PRN

## 2016-03-18 MED ORDER — MONTELUKAST SODIUM 10 MG PO TABS
10.0000 mg | ORAL_TABLET | Freq: Every day | ORAL | Status: DC
  Administered 2016-03-18 – 2016-03-22 (×5): 10 mg via ORAL
  Filled 2016-03-18 (×5): qty 1

## 2016-03-18 MED ORDER — DILTIAZEM HCL ER COATED BEADS 120 MG PO CP24
120.0000 mg | ORAL_CAPSULE | Freq: Every day | ORAL | Status: DC
  Administered 2016-03-19 – 2016-03-22 (×4): 120 mg via ORAL
  Filled 2016-03-18 (×5): qty 1

## 2016-03-18 MED ORDER — ATENOLOL 25 MG PO TABS
25.0000 mg | ORAL_TABLET | Freq: Every day | ORAL | Status: DC
  Administered 2016-03-19 – 2016-03-22 (×4): 25 mg via ORAL
  Filled 2016-03-18 (×4): qty 1

## 2016-03-18 MED ORDER — ACETAMINOPHEN 325 MG PO TABS
650.0000 mg | ORAL_TABLET | ORAL | Status: DC | PRN
  Administered 2016-03-19 (×2): 650 mg via ORAL
  Filled 2016-03-18 (×2): qty 2

## 2016-03-18 MED ORDER — INSULIN ASPART 100 UNIT/ML ~~LOC~~ SOLN
0.0000 [IU] | Freq: Three times a day (TID) | SUBCUTANEOUS | Status: DC
  Administered 2016-03-19: 1 [IU] via SUBCUTANEOUS
  Administered 2016-03-20: 2 [IU] via SUBCUTANEOUS
  Administered 2016-03-20 (×2): 1 [IU] via SUBCUTANEOUS
  Administered 2016-03-21: 3 [IU] via SUBCUTANEOUS
  Administered 2016-03-21: 1 [IU] via SUBCUTANEOUS
  Administered 2016-03-21: 3 [IU] via SUBCUTANEOUS
  Administered 2016-03-22 – 2016-03-23 (×2): 1 [IU] via SUBCUTANEOUS

## 2016-03-18 MED ORDER — FUROSEMIDE 10 MG/ML IJ SOLN
40.0000 mg | Freq: Two times a day (BID) | INTRAMUSCULAR | Status: DC
  Administered 2016-03-18 – 2016-03-20 (×3): 40 mg via INTRAVENOUS
  Filled 2016-03-18 (×3): qty 4

## 2016-03-18 MED ORDER — HYDROCODONE-ACETAMINOPHEN 5-325 MG PO TABS
1.0000 | ORAL_TABLET | Freq: Once | ORAL | Status: AC
  Administered 2016-03-18: 1 via ORAL
  Filled 2016-03-18: qty 1

## 2016-03-18 MED ORDER — SODIUM CHLORIDE 0.9% FLUSH
3.0000 mL | Freq: Two times a day (BID) | INTRAVENOUS | Status: DC
  Administered 2016-03-18 – 2016-03-23 (×10): 3 mL via INTRAVENOUS

## 2016-03-18 MED ORDER — MOMETASONE FURO-FORMOTEROL FUM 200-5 MCG/ACT IN AERO
2.0000 | INHALATION_SPRAY | Freq: Two times a day (BID) | RESPIRATORY_TRACT | Status: DC
  Administered 2016-03-20 – 2016-03-23 (×6): 2 via RESPIRATORY_TRACT
  Filled 2016-03-18 (×3): qty 8.8

## 2016-03-18 MED ORDER — SODIUM CHLORIDE 0.9% FLUSH: 3.0000 mL | INTRAVENOUS | Status: DC | PRN

## 2016-03-18 MED ORDER — MAGNESIUM SULFATE 2 GM/50ML IV SOLN
2.0000 g | Freq: Once | INTRAVENOUS | Status: AC
  Administered 2016-03-18: 2 g via INTRAVENOUS
  Filled 2016-03-18: qty 50

## 2016-03-18 NOTE — ED Notes (Signed)
Pt ambulated down hall, O2 sats were 84%, with heavy breathing.

## 2016-03-18 NOTE — ED Notes (Signed)
[facility] at bedside 

## 2016-03-18 NOTE — Progress Notes (Signed)
VASCULAR LAB PRELIMINARY  ARTERIAL  ABI completed: Unable to obtain right sided ABI due to the below the knee amputation. Left ABI of 0.27 is suggestive of critical arterial occlusive disease at rest.    RIGHT    LEFT    PRESSURE WAVEFORM  PRESSURE WAVEFORM  BRACHIAL Biphasic 142 BRACHIAL 162 Biphasic  DP   DP 44 Monophasic  PT   PT >254 Monophasic    RIGHT LEFT  ABI  0.27   Results given to Dr. [name].  Legrand Como, RVT 03/18/2016, 4:02 PM

## 2016-03-18 NOTE — Progress Notes (Signed)
Patient arrived to 3e07 via [facility].  Patient is alert and oriented and ambulated with standby assistance to the bed.  Patient has no complaints of pain.  Patient oriented to room, phone and call bell.  Will continue to monitor.

## 2016-03-18 NOTE — ED Triage Notes (Signed)
Per EMS. Pt from home. Pt reports generalized weakness and SOB for 2 days. Hx of CHF, has been non-compliant with meds. Also has home health at home.

## 2016-03-18 NOTE — ED Provider Notes (Signed)
[facility] DEPT Provider Note   CSN: [id number] Arrival date & time: 03/18/16  1018     History   Chief Complaint Chief Complaint  Patient presents with  . Weakness  . Shortness of Breath    HPI Sarah Moss is a 76 y.o. female.  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 Pt states that she started feeling very weak last night and had difficulty getting out of bed this morning due to extreme shortness of breath. She has shortness of breath that "comes and goes" and is worse with activity. Family member relays that patient has been gradually more SOB over the past 2 weeks. First with activity only, but now even at rest. She denies worsening lower extremity swelling, fever, chest pain, vomiting, diarrhea. The onset of this condition was gradual. The course is worsening. Alleviating factors: none.   From vascular note 03/08/16:   LE PVR WITH TPS  Status: Final result  Study Result   Procedure: Examination consists of physiologic resting arterial pressures of the brachial and ankle arteries bilaterally with pulse volume waveform analysis of the lower extremities as well as pulsed wave Doppler analysis of the bilateral common femoral  arteries. Previous: No previous exams for comparison. Right: S/P AKA. The CFA waveform is monophasic. Left: The CFA waveform is biphasic. Conclusions: Right: Suggests aorto-iliac occlusive disease. s/p AKA Left: Non-compressible ABI, suggesting medial calcification. Exam indicates significantly abnormal PVR. Suggests superficial femoral artery occlusion.   Plan Her left ABI is non-compressible, though her toe pressure of 60 is adequate for wound healing. Her left ankle waveform is pulsatile, but slightly dampened. Her symptoms right now are not consistent with ischemia rest pain and  she has no tissue loss. She does have some left lower leg and ankle swelling. This is probably multifactorial given her CHF and COPD. The patient was given a prescription for compression stockings and instructed to wear them as much as possible during the day to improve her symptoms of leg swelling. We also discussed the importance of leg elevation. We discussed doing an arteriogram to further assess her lower extremity blood flow and possibly intervene, but she would like to hold off at this time. Will plan to follow up in 3 weeks to assess the patient's symptoms.        Past Medical History:  Diagnosis Date  . Asthma   . CHF (congestive heart failure) ([place])   . COPD (chronic obstructive pulmonary disease) ([place])   . Diabetes mellitus without complication ([place])   . GERD (gastroesophageal reflux disease)   . Hypertension     There are no active problems to display for this patient.   History reviewed. No pertinent surgical history.  OB History    No data available       Home Medications    Prior to Admission medications   Medication Sig Start Date End Date Taking? Authorizing Provider  ADVAIR DISKUS 250-50 MCG/DOSE AEPB Inhale 1 puff into the lungs 2 (two) times daily. 03/08/16  Yes Historical Provider, MD  ALPRAZolam Duanne Moron) 0.25 MG tablet Take 0.25 mg by mouth every 12 (twelve) hours as needed for anxiety.   Yes Historical Provider, MD  atenolol (TENORMIN) 25 MG tablet Take 25 mg by mouth daily. 02/22/15  Yes Historical Provider, MD  atorvastatin (LIPITOR) 40 MG tablet Take  40 mg by mouth daily. 05/30/14  Yes Historical Provider, MD  diltiazem (TIAZAC) 120 MG 24 hr capsule Take 120 mg by mouth daily. 02/22/15  Yes Historical Provider, MD  DULoxetine (CYMBALTA) 60 MG capsule Take 60 mg by mouth daily.   Yes Historical Provider, MD  furosemide (LASIX) 40 MG tablet Take 40 mg by mouth daily.   Yes Historical Provider, MD  gabapentin (NEURONTIN) 300 MG capsule Take 300 mg by mouth at  bedtime. 05/30/14  Yes Historical Provider, MD  levothyroxine (SYNTHROID, LEVOTHROID) 25 MCG tablet Take 25 mcg by mouth daily. 05/30/14  Yes Historical Provider, MD  lisinopril (PRINIVIL,ZESTRIL) 20 MG tablet Take 20 mg by mouth daily.   Yes Historical Provider, MD  metFORMIN (GLUCOPHAGE) 1000 MG tablet Take 1,000 mg by mouth 2 (two) times daily. 05/30/14  Yes Historical Provider, MD  montelukast (SINGULAIR) 10 MG tablet Take 10 mg by mouth at bedtime.   Yes Historical Provider, MD  nitroGLYCERIN (NITROSTAT) 0.4 MG SL tablet Place 0.4 mg under the tongue every 5 (five) minutes as needed for chest pain.   Yes Historical Provider, MD  omeprazole (PRILOSEC) 40 MG capsule Take 40 mg by mouth daily.   Yes Historical Provider, MD  potassium chloride (K-DUR) 10 MEQ tablet Take 10 mEq by mouth 2 (two) times daily. For 30 days 02/12/16  Yes Historical Provider, MD  VENTOLIN HFA 108 (90 Base) MCG/ACT inhaler Inhale 1 puff into the lungs every 6 (six) hours as needed for shortness of breath.  03/08/16  Yes Historical Provider, MD  warfarin (COUMADIN) 4 MG tablet Take 4-6 mg by mouth daily. Patient alternates between 4 mg and 6 mg daily. Patient had 4 mg on 03/17/16 07/04/15 07/03/16 Yes Historical Provider, MD    Family History History reviewed. No pertinent family history.  Social History Social History  Substance Use Topics  . Smoking status: Former Research scientist (life sciences)  . Smokeless tobacco: Never Used  . Alcohol use Not on file     Allergies   Codeine and Ambien  [zolpidem]   Review of Systems Review of Systems  Constitutional: Negative for fever.  HENT: Negative for rhinorrhea and sore throat.   Eyes: Negative for redness.  Respiratory: Negative for cough and shortness of breath.   Cardiovascular: Negative for chest pain and leg swelling.  Gastrointestinal: Positive for diarrhea (one episode last night). Negative for abdominal pain, nausea and vomiting.  Genitourinary: Negative for dysuria.  Musculoskeletal:  Negative for myalgias.  Skin: Negative for rash.  Neurological: Positive for weakness. Negative for headaches.     Physical Exam Updated Vital Signs BP 131/62   Pulse 79   Temp 97.6 F (36.4 C) (Oral)   Resp 20   SpO2 98%   Physical Exam  Constitutional: She appears well-developed and well-nourished.  HENT:  Head: Normocephalic and atraumatic.  Mouth/Throat: Oropharynx is clear and moist.  Eyes: Conjunctivae are normal. Right eye exhibits no discharge. Left eye exhibits no discharge.  Neck: Normal range of motion. Neck supple.  Cardiovascular: Normal rate and regular rhythm.   Murmur (Systolic) heard. Pulmonary/Chest: Effort normal and breath sounds normal. No respiratory distress. She has no wheezes. She has no rales.  Abdominal: Soft. There is no tenderness. There is no rebound and no guarding.  Musculoskeletal: She exhibits edema.  Previous R BKA. 1-2+ L LE. Rubor to toes. Extremity slightly cool to touch. Decreased pulse -- found with doppler.   Neurological: She is alert.  Skin: Skin is warm and dry.  Psychiatric:  She has a normal mood and affect.  Nursing note and vitals reviewed.    ED Treatments / Results  Labs (all labs ordered are listed, but only abnormal results are displayed) Labs Reviewed  CBC WITH DIFFERENTIAL/PLATELET - Abnormal; Notable for the following:       Result Value   Hemoglobin 9.8 (*)    HCT 31.9 (*)    MCV 69.0 (*)    MCH 21.2 (*)    RDW 19.3 (*)    All other components within normal limits  COMPREHENSIVE METABOLIC PANEL - Abnormal; Notable for the following:    Glucose, Bld 137 (*)    BUN 27 (*)    Creatinine, Ser 1.11 (*)    GFR calc non Af Amer 47 (*)    GFR calc Af Amer 54 (*)    All other components within normal limits  PROTIME-INR - Abnormal; Notable for the following:    Prothrombin Time 26.9 (*)    All other components within normal limits  URINALYSIS, ROUTINE W REFLEX MICROSCOPIC  BRAIN NATRIURETIC PEPTIDE  I-STAT  TROPOININ, ED    Radiology Dg Chest 2 View  Result Date: 03/18/2016 CLINICAL DATA:  Generalized weakness, shortness of breath for 2 days EXAM: CHEST  2 VIEW COMPARISON:  03/05/2016 FINDINGS: Cardiomegaly again noted. Dual lead cardiac pacemaker is unchanged in position. Central mild vascular congestion without convincing pulmonary edema. Stable trace bilateral pleural effusion with basilar atelectasis. Osteopenia and mild degenerative changes thoracic spine. Atherosclerotic calcifications of thoracic aorta. No segmental infiltrate. IMPRESSION: Central mild vascular congestion without convincing pulmonary edema. Stable trace bilateral pleural effusion with basilar atelectasis. No segmental infiltrates. Electronically Signed   By: Lahoma Crocker M.D.   On: 03/18/2016 11:10    Procedures Procedures (including critical care time)  Medications Ordered in ED Medications - No data to display   Initial Impression / Assessment and Plan / ED Course  I have reviewed the triage vital signs and the nursing notes.  Pertinent labs & imaging results that were available during my care of the patient were reviewed by me and considered in my medical decision making (see chart for details).     Patient seen and examined. Work-up initiated.   Vital signs reviewed and are as follows: BP 131/62   Pulse 79   Temp 97.6 F (36.4 C) (Oral)   Resp 20   SpO2 98%   ED ECG REPORT   Date: 03/18/2016  Rate: 80  Rhythm: paced  Old EKG Reviewed: unchanged  I have personally reviewed the EKG tracing and agree with the computerized printout as noted.  1:41 PM Patient desaturates to 84% with ambulation. Suspect worsening CHF. Lasix ordered.   1:50 PM Discussed with Dr. Venora Maples who will see. Will admit.   2:19 PM Spoke with Dr. [name] who will see patient.   2:51 PM Called to room for acute onset of L foot and ankle pain. Vicodin ordered. Hand-held doppler used to find L DP pulse, which was found readily over the  forefoot.    Final Clinical Impressions(s) / ED Diagnoses   Final diagnoses:  Shortness of breath  Hypoxia   Worsening SOB over past 2 weeks, more severe in past day. No CP. O2 drops to 84% with ambulation.   New Prescriptions New Prescriptions   No medications on file     Carlisle Cater, PA-C 03/18/16 [address], PA-C 03/18/16 [employer], PA-C 03/18/16 1512    Lennette Bihari  Venora Maples, MD 03/19/16 1007

## 2016-03-18 NOTE — ED Notes (Signed)
Bed: WA20 Expected date:  Expected time:  Means of arrival:  Comments: EMS 75 weakness

## 2016-03-18 NOTE — ED Notes (Signed)
Hospitalist is going to admit pt to [facility]. Will change order

## 2016-03-18 NOTE — ED Notes (Signed)
Per Maudie Mercury admit nurse states that pt is requesting pain med for her L foot.  She states that her L foot appears red and swollen.  Pt reported to her that it has been hurting her.  Josh EDPA made aware and he also reports that pt never reported this to him during assessment.

## 2016-03-18 NOTE — H&P (Addendum)
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 She has progressive shortness of breath that "comes and goes" and is worse with activity over the last 1 month. She denies any chest pain. She denies any palpitations or lightheadedness. She was most recently hospitalized and discharged from [facility] on 12/26/15 for CHF exacerbation. Most recent 2-D echo 10/02/15, showed low normal LV systolic function 99991111, moderate MR and TR, moderate pulmonary hypertension, biatrial enlargement,  Patient also complains of left foot pain. Patient was recently evaluated by the vascular surgery on 03/08/16 Dr. Kathreen Cosier, [name], MD, [facility] .Exam indicates significantly abnormal PVR. Suggests superficial femoral artery occlusion. Her left ABI is non-compressible, though her toe pressure of 60 is adequate for wound healing. Her left ankle waveform is pulsatile, but slightly dampened. Her symptoms right now are not consistent with ischemia rest pain and she has no tissue loss. Compression stockings were recommended. Angiogram was recommended, she would like to hold off on it. Patient was planned to have a 3 week follow-up with her vascular surgeon. ED course BP 131/62   Pulse 79   Temp 97.6 F (36.4 C) (Oral)   Resp 20   SpO2 98% Chest x-ray shows central mild vascular congestion without pulmonary edema Hemoglobin 9.8, MCV 69.0, creatinine 1.11, BNP 765.5, troponin 0.02, INR 2.44, UA negative, Patient evaluated for shortness of breath, left  foot pain.    Review of Systems: negative for the following  Constitutional: Denies fever, chills, diaphoresis, appetite change and fatigue.  HEENT: Denies photophobia, eye pain, redness, hearing loss, ear pain, congestion, sore throat, rhinorrhea, sneezing, mouth sores, trouble swallowing, neck pain, neck stiffness and tinnitus.  Respiratory: Positive for SOB, DOE, cough, chest tightness, and wheezing.  Cardiovascular: Denies chest pain, palpitations and leg swelling.  Gastrointestinal: Denies nausea, vomiting, abdominal pain, diarrhea, constipation, blood in stool and abdominal distention.  Genitourinary: Denies dysuria, urgency, frequency, hematuria, flank pain and difficulty urinating.  Musculoskeletal: Denies myalgias, back pain, joint swelling, positive for left foot pain and gait problem.  Skin: Denies pallor, rash and wound.  Neurological: Denies dizziness, seizures, syncope, weakness, light-headedness, numbness and headaches.  Hematological: Denies adenopathy. Easy bruising, personal or family bleeding history  Psychiatric/Behavioral: Denies suicidal ideation, mood changes, confusion, nervousness, sleep disturbance and agitation       Past Medical History:  Diagnosis Date  . Asthma   . CHF (congestive heart failure) ([place])   . COPD (chronic obstructive pulmonary disease) ([place])   . Diabetes mellitus without complication ([place])   . GERD (gastroesophageal reflux disease)   . Hypertension      History reviewed. No pertinent surgical history.    Social History:  reports that she has quit smoking. She has never used smokeless tobacco. Her alcohol and drug histories are not on file.    Allergies  Allergen Reactions  . Codeine Itching and Nausea And Vomiting  . Ambien  [Zolpidem]     Other reaction(s): Confusion Makes me crazy         FAMILY HISTORY  When questioned  Directly-patient reports  No family history of HTN, CVA ,DIABETES, TB, Cancer CAD, Bleeding Disorders,  Sickle Cell, diabetes, anemia, asthma,   Prior to Admission medications   Medication Sig Start Date End Date Taking? Authorizing Provider  ADVAIR DISKUS 250-50 MCG/DOSE AEPB Inhale 1 puff into the lungs 2 (two) times daily. 03/08/16  Yes Historical Provider, MD  ALPRAZolam Duanne Moron) 0.25 MG tablet Take 0.25 mg by mouth every 12 (twelve) hours as needed for anxiety.   Yes Historical Provider, MD  atenolol (TENORMIN) 25 MG tablet Take 25 mg by mouth daily. 02/22/15  Yes Historical Provider, MD  atorvastatin (LIPITOR) 40 MG tablet Take 40 mg by mouth daily. 05/30/14  Yes Historical Provider, MD  diltiazem (TIAZAC) 120 MG 24 hr capsule Take 120 mg by mouth daily. 02/22/15  Yes Historical Provider, MD  DULoxetine (CYMBALTA) 60 MG capsule Take 60 mg by mouth daily.   Yes Historical Provider, MD  furosemide (LASIX) 40 MG tablet Take 40 mg by mouth daily.   Yes Historical Provider, MD  gabapentin (NEURONTIN) 300 MG capsule Take 300 mg by mouth at bedtime. 05/30/14  Yes Historical Provider, MD  levothyroxine (SYNTHROID, LEVOTHROID) 25 MCG tablet Take 25 mcg by mouth daily. 05/30/14  Yes Historical Provider, MD  lisinopril (PRINIVIL,ZESTRIL) 20 MG tablet Take 20 mg by mouth daily.   Yes Historical Provider, MD  metFORMIN (GLUCOPHAGE) 1000 MG tablet Take 1,000 mg by mouth 2 (two) times daily. 05/30/14  Yes Historical Provider, MD  montelukast (SINGULAIR) 10 MG tablet Take 10 mg by mouth at bedtime.   Yes Historical Provider, MD  nitroGLYCERIN (NITROSTAT) 0.4 MG SL tablet Place 0.4 mg under the tongue every 5 (five) minutes as needed for chest pain.   Yes Historical Provider, MD  omeprazole (PRILOSEC) 40 MG capsule Take 40 mg by mouth daily.   Yes Historical Provider, MD  potassium chloride (K-DUR) 10 MEQ tablet Take 10 mEq by mouth 2 (two) times daily. For 30 days 02/12/16  Yes Historical Provider, MD  VENTOLIN HFA 108 (90 Base) MCG/ACT inhaler Inhale 1 puff into the lungs every 6 (six) hours as needed for shortness of  breath.  03/08/16  Yes Historical Provider, MD  warfarin (COUMADIN) 4 MG tablet Take 4-6 mg by mouth daily. Patient alternates between 4 mg and 6 mg daily. Patient had 4 mg on 03/17/16 07/04/15 07/03/16 Yes Historical Provider, MD     Physical Exam: Vitals:   03/18/16 1033 03/18/16 1212 03/18/16 1245 03/18/16 1446  BP: 131/62  143/74 143/65  Pulse: 79  80 80  Resp: 20  24 16   Temp: 97.6 F (36.4 C) 98.5 F (36.9 C)    TempSrc: Oral Rectal    SpO2: 98%  97% 98%      Constitutional: NAD, calm, comfortable Vitals:   03/18/16 1033 03/18/16 1212 03/18/16 1245 03/18/16 1446  BP: 131/62  143/74 143/65  Pulse: 79  80 80  Resp: 20  24 16   Temp: 97.6 F (36.4 C) 98.5 F (36.9 C)    TempSrc: Oral Rectal    SpO2: 98%  97% 98%   Eyes: PERRL, lids and conjunctivae normal ENMT: Mucous membranes are moist. Posterior pharynx clear of any exudate or lesions.Normal dentition.  Neck: normal, supple, no masses, no thyromegaly Respiratory: clear to auscultation bilaterally, no wheezing, no crackles. Normal respiratory effort. No accessory muscle use.  Cardiovascular: Murmur (Systolic) heard. Pulmonary/Chest: Effort normal and breath sounds normal. No respiratory distress. She has no wheezes. She has no rales.  Abdominal: Soft. There is no tenderness. There is no rebound and no guarding.  Musculoskeletal: She exhibits edema.  Previous R BKA. 1-2+ L LE. Rubor to toes. Extremity slightly cool to touch. Decreased pulse -- found with doppler. Skin: no rashes, lesions, ulcers. No induration Neurologic: CN 2-12 grossly intact. Sensation intact, DTR normal. Strength 5/5 in all 4.  Psychiatric: Normal judgment and insight. Alert and oriented x 3. Normal mood.     Labs on Admission: I have personally reviewed following labs and imaging studies  CBC:  Recent Labs Lab 03/18/16 1132  WBC 8.0  NEUTROABS 5.0  HGB 9.8*  HCT 31.9*  MCV 69.0*  PLT 99991111    Basic Metabolic Panel:  Recent Labs Lab  03/18/16 1132  NA 139  K 4.3  CL 106  CO2 22  GLUCOSE 137*  BUN 27*  CREATININE 1.11*  CALCIUM 9.0    GFR: Estimated Creatinine Clearance: 38.8 mL/min (by C-G formula based on SCr of 1.11 mg/dL (H)).  Liver Function Tests:  Recent Labs Lab 03/18/16 1132  AST 41  ALT 17  ALKPHOS 75  BILITOT 1.0  PROT 7.5  ALBUMIN 3.8   No results for input(s): LIPASE, AMYLASE in the last 168 hours. No results for input(s): AMMONIA in the last 168 hours.  Coagulation Profile:  Recent Labs Lab 03/18/16 1132  INR 2.44   No results for input(s): DDIMER in the last 72 hours.  Cardiac Enzymes: No results for input(s): CKTOTAL, CKMB, CKMBINDEX, TROPONINI in the last 168 hours.  BNP (last 3 results) No results for input(s): PROBNP in the last 8760 hours.  HbA1C: No results for input(s): HGBA1C in the last 72 hours. No results found for: HGBA1C   CBG: No results for input(s): GLUCAP in the last 168 hours.  Lipid Profile: No results for input(s): CHOL, HDL, LDLCALC, TRIG, CHOLHDL, LDLDIRECT in the last 72 hours.  Thyroid Function Tests: No results for input(s): TSH, T4TOTAL, FREET4, T3FREE, THYROIDAB in the last 72 hours.  Anemia Panel: No results for input(s): VITAMINB12, FOLATE, FERRITIN, TIBC, IRON, RETICCTPCT in the last 72 hours.  Urine analysis:    Component Value Date/Time   COLORURINE YELLOW 03/18/2016 1132   APPEARANCEUR CLEAR 03/18/2016 1132   LABSPEC 1.009 03/18/2016 1132   PHURINE 5.0 03/18/2016 1132   GLUCOSEU NEGATIVE 03/18/2016 1132   HGBUR NEGATIVE 03/18/2016 1132   BILIRUBINUR NEGATIVE 03/18/2016 1132   KETONESUR NEGATIVE 03/18/2016 1132   PROTEINUR NEGATIVE 03/18/2016 1132   NITRITE NEGATIVE 03/18/2016 1132   LEUKOCYTESUR NEGATIVE 03/18/2016 1132    Sepsis Labs: @LABRCNTIP (procalcitonin:4,lacticidven:4) )No results found for this or any previous visit (from the past 240 hour(s)).       Radiological Exams on Admission: Dg Chest 2  View  Result Date: 03/18/2016 CLINICAL DATA:  Generalized weakness, shortness of breath for 2 days EXAM: CHEST  2 VIEW COMPARISON:  03/05/2016 FINDINGS: Cardiomegaly again noted. Dual lead cardiac pacemaker is unchanged in position. Central mild vascular congestion without convincing pulmonary edema. Stable trace bilateral pleural effusion with basilar atelectasis. Osteopenia and mild degenerative changes thoracic spine. Atherosclerotic calcifications of thoracic aorta. No segmental infiltrate. IMPRESSION: Central mild vascular congestion without convincing pulmonary edema. Stable trace bilateral pleural effusion with basilar atelectasis. No segmental infiltrates. Electronically Signed   By: Lahoma Crocker M.D.   On: 03/18/2016 11:10   Dg Chest 2 View  Result Date: 03/18/2016 CLINICAL DATA:  Generalized weakness, shortness of breath for 2 days EXAM: CHEST  2 VIEW COMPARISON:  03/05/2016  FINDINGS: Cardiomegaly again noted. Dual lead cardiac pacemaker is unchanged in position. Central mild vascular congestion without convincing pulmonary edema. Stable trace bilateral pleural effusion with basilar atelectasis. Osteopenia and mild degenerative changes thoracic spine. Atherosclerotic calcifications of thoracic aorta. No segmental infiltrate. IMPRESSION: Central mild vascular congestion without convincing pulmonary edema. Stable trace bilateral pleural effusion with basilar atelectasis. No segmental infiltrates. Electronically Signed   By: Lahoma Crocker M.D.   On: 03/18/2016 11:10   Dg Chest 2 View  Result Date: 03/05/2016 CLINICAL DATA:  76 year old female with a 1 week history of shortness of breath and chest pain which is worse on exertion EXAM: CHEST  2 VIEW COMPARISON:  Prior chest x-ray 12/14/2014 FINDINGS: Interval placement of a left subclavian approach cardiac rhythm maintenance device compared to prior imaging. This is a 2 lead device with leads projecting over the right atrium and right ventricle. Stable mild  cardiomegaly and mediastinal contours. Atherosclerotic calcifications present in the transverse aorta. No evidence of pulmonary edema, new focal airspace consolidation or pneumothorax. There are small bilateral pleural effusions. Stable chronic bronchitic change and mild interstitial prominence. No acute osseous abnormality. IMPRESSION: 1. Small bilateral pleural effusions which are new compared to 12/14/2014. 2. Stable cardiomegaly without pulmonary edema. 3.  Aortic Atherosclerosis (ICD10-170.0) Electronically Signed   By: Jacqulynn Cadet M.D.   On: 03/05/2016 08:18      EKG: Independently reviewed.    Assessment/Plan Principal Problem:   Acute on chronic CHF (congestive heart failure) (HCC) Likely acute on chronic diastolic heart failure Last 2-D echo was 09/30/16, will continue with Lasix, repeat 2-D echo, Continue atenolol, diltiazem for rate control Continue lisinopril She has previous hospitalizations at [facility] for CHF exacerbation secondary to medication and fluid restriction noncompliance She is followed by cardiology in [facility] Check TSH, free T4   Left foot pain-given evaluation on 2/23, will consult vascular surgery to see if any intervention is needed Stat ABI has been ordered, Dr Donzetta Matters has been notified  Also check uric acid Will tx to [facility] for urgent vascular surgery evaluation    Atrial fibrillation (HCC)-rate controlled Continue atenolol and diltiazem Patient has a pacemaker, which is capturing Continue Coumadin, INR therapeutic on admission She will follow up with Dr. Darrol Angel to have her INR checked, after discharge    Hx of right BKA (HCC)-patient wears a prosthetic leg  Chronic stable COPD (chronic obstructive pulmonary disease) ([place]) Doubt exacerbation, patient is not wheezing Quit smoking 25 years ago Continue home nebulizer treatments, Advair, Singulair, as needed albuterol  Diabetes mellitus ([place]) patient will be started on sliding scale  insulin  Gastroesophageal reflux disease-continue PPI  Anxiety-patient patient will be continued on Xanax, Cymbalta  Microcytic anemia-patient has a baseline hemoglobin of 11, slowly trending down over the last 6 months. Will check anemia panel Will need outpatient GI workup Consider transfusion for symptomatic anemia Check FOBT       DVT prophylaxis: Coumadin     Code Status Orders  Full code         consults called:Vascular surgery  Family Communication: Admission, patients condition and plan of care including tests being ordered have been discussed with the patient  who indicates understanding and agree with the plan and Code Status  Admission status: inpatient    Disposition plan: Further plan will depend as patient's clinical course evolves and further radiologic and laboratory data become available. Likely home when stable   At the time of admission, it appears that the appropriate admission status for  this patient is INPATIENT .Thisis judged to be reasonable and necessary in order to provide the required intensity of service to ensure the patient's safetygiven thepresenting symptoms, physical exam findings, and initial radiographic and laboratory data in the context of their chronic comorbidities.   Reyne Dumas MD [facility] Pager [phone]  If 7PM-7AM, please contact night-coverage [email or web address] Password [facility]  03/18/2016, 3:23 PM

## 2016-03-18 NOTE — ED Notes (Signed)
ED Provider at bedside. 

## 2016-03-18 NOTE — Consult Note (Signed)
Hospital Consult    Reason for Consult:  Left leg ischemia Referring Physician:  ED MRN #:  [id number]  History of Present Illness: This is a 76 y.o. female with history of a traumatic right lower extremity amputation had a history of reported left SFA stenting. She now presents with a 3-5 day history of left lower extremity pain. She is followed by vascular surgery [place] who told her that the leg is fine.  Her pain was associated with skin changes that include blotchiness to the toes that she has not had before. She also has associated numbness but that has resolved. She cannot feel her toes fine to just move them well. She does ambulate with the use of a right-sided prosthetic which she is wearing about today's interview. She has not any fevers chills nausea vomiting or other constitutional symptoms. She has never had issues like this before. She does not note alleviating or exacerbating factors. She's not had rest pain or claudication leading up to this. She does not have tissue loss or ulceration. She does have atrial fibrillation for which she takes Coumadin. She is diabetic and former smoker having quit 45 years ago. She is a widow as of 2 years ago.  Past Medical History:  Diagnosis Date  . Asthma   . CHF (congestive heart failure) ([place])   . COPD (chronic obstructive pulmonary disease) ([place])   . Diabetes mellitus without complication ([place])   . GERD (gastroesophageal reflux disease)   . Hx of right BKA ([place])   . Hypertension     History reviewed. No pertinent surgical history.  Allergies  Allergen Reactions  . Codeine Itching and Nausea And Vomiting  . Ambien  [Zolpidem]     Other reaction(s): Confusion Makes me crazy     Prior to Admission medications   Medication Sig Start Date End Date Taking? Authorizing Provider  ADVAIR DISKUS 250-50 MCG/DOSE AEPB Inhale 1 puff into the lungs 2 (two) times daily. 03/08/16  Yes Historical Provider, MD  ALPRAZolam Duanne Moron) 0.25 MG  tablet Take 0.25 mg by mouth every 12 (twelve) hours as needed for anxiety.   Yes Historical Provider, MD  atenolol (TENORMIN) 25 MG tablet Take 25 mg by mouth daily. 02/22/15  Yes Historical Provider, MD  atorvastatin (LIPITOR) 40 MG tablet Take 40 mg by mouth daily. 05/30/14  Yes Historical Provider, MD  diltiazem (TIAZAC) 120 MG 24 hr capsule Take 120 mg by mouth daily. 02/22/15  Yes Historical Provider, MD  DULoxetine (CYMBALTA) 60 MG capsule Take 60 mg by mouth daily.   Yes Historical Provider, MD  furosemide (LASIX) 40 MG tablet Take 40 mg by mouth daily.   Yes Historical Provider, MD  gabapentin (NEURONTIN) 300 MG capsule Take 300 mg by mouth at bedtime. 05/30/14  Yes Historical Provider, MD  levothyroxine (SYNTHROID, LEVOTHROID) 25 MCG tablet Take 25 mcg by mouth daily. 05/30/14  Yes Historical Provider, MD  lisinopril (PRINIVIL,ZESTRIL) 20 MG tablet Take 20 mg by mouth daily.   Yes Historical Provider, MD  metFORMIN (GLUCOPHAGE) 1000 MG tablet Take 1,000 mg by mouth 2 (two) times daily. 05/30/14  Yes Historical Provider, MD  montelukast (SINGULAIR) 10 MG tablet Take 10 mg by mouth at bedtime.   Yes Historical Provider, MD  nitroGLYCERIN (NITROSTAT) 0.4 MG SL tablet Place 0.4 mg under the tongue every 5 (five) minutes as needed for chest pain.   Yes Historical Provider, MD  omeprazole (PRILOSEC) 40 MG capsule Take 40 mg by mouth daily.   Yes  Historical Provider, MD  potassium chloride (K-DUR) 10 MEQ tablet Take 10 mEq by mouth 2 (two) times daily. For 30 days 02/12/16  Yes Historical Provider, MD  VENTOLIN HFA 108 (90 Base) MCG/ACT inhaler Inhale 1 puff into the lungs every 6 (six) hours as needed for shortness of breath.  03/08/16  Yes Historical Provider, MD  warfarin (COUMADIN) 4 MG tablet Take 4-6 mg by mouth daily. Patient alternates between 4 mg and 6 mg daily. Patient had 4 mg on 03/17/16 07/04/15 07/03/16 Yes Historical Provider, MD    Social History   Social History  . Marital status: Married     Spouse name: N/A  . Number of children: N/A  . Years of education: N/A   Occupational History  . Not on file.   Social History Main Topics  . Smoking status: Former Research scientist (life sciences)  . Smokeless tobacco: Never Used  . Alcohol use Not on file  . Drug use: Unknown  . Sexual activity: Not on file   Other Topics Concern  . Not on file   Social History Narrative  . No narrative on file     History reviewed. No pertinent family history.  ROS: [x]  Positive   [ ]  Negative   [ ]  All sytems reviewed and are negative  Cardiovascular: []  chest pain/pressure []  palpitations [x]  SOB lying flat []  DOE []  pain in legs while walking []  pain in legs at rest []  pain in legs at night []  non-healing ulcers []  hx of DVT []  swelling in legs  Pulmonary: []  productive cough []  asthma/wheezing []  home O2  Neurologic: []  weakness in []  arms []  legs [x numbness in []  arms [x]  legs []  hx of CVA []  mini stroke [] difficulty speaking or slurred speech []  temporary loss of vision in one eye []  dizziness  Hematologic: []  hx of cancer []  bleeding problems []  problems with blood clotting easily  Endocrine:   [x]  diabetes []  thyroid disease  GI []  vomiting blood []  blood in stool  GU: []  CKD/renal failure []  HD--[]  M/W/F or []  T/T/S []  burning with urination []  blood in urine  Psychiatric: []  anxiety []  depression  Musculoskeletal: []  arthritis []  joint pain  Integumentary: [x]  rashes []  ulcers  Constitutional: []  fever []  chills   Physical Examination  Vitals:   03/18/16 1745 03/18/16 1838  BP:  (!) 141/59  Pulse: 79 80  Resp: 20 20  Temp:  98.3 F (36.8 C)   Body mass index is 25.59 kg/m.  General:  WDWN in NAD Gait: Not observed HENT: WNL, normocephalic Pulmonary: normal non-labored breathing Cardiac: regular rate, palpable left femoral pulse 2+, right is 1+ There is a multiphasic left peroneal signal and monophasic dp/pt Abdomen: soft, NT/ND, no  masses Extremities: skin changes to distal left foot  Musculoskeletal: no muscle wasting or atrophy  Neurologic: left foot is sensory and motor in tact Psychiatric:  Appropriate affect  CBC    Component Value Date/Time   WBC 8.0 03/18/2016 1132   RBC 4.59 03/18/2016 1618   RBC 4.62 03/18/2016 1132   HGB 9.8 (L) 03/18/2016 1132   HCT 31.9 (L) 03/18/2016 1132   PLT 265 03/18/2016 1132   MCV 69.0 (L) 03/18/2016 1132   MCH 21.2 (L) 03/18/2016 1132   MCHC 30.7 03/18/2016 1132   RDW 19.3 (H) 03/18/2016 1132   LYMPHSABS 1.8 03/18/2016 1132   MONOABS 1.0 03/18/2016 1132   EOSABS 0.2 03/18/2016 1132   BASOSABS 0.0 03/18/2016 1132    BMET  Component Value Date/Time   NA 139 03/18/2016 1132   K 4.3 03/18/2016 1132   CL 106 03/18/2016 1132   CO2 22 03/18/2016 1132   GLUCOSE 137 (H) 03/18/2016 1132   BUN 27 (H) 03/18/2016 1132   CREATININE 1.11 (H) 03/18/2016 1132   CALCIUM 9.0 03/18/2016 1132   GFRNONAA 47 (L) 03/18/2016 1132   GFRAA 54 (L) 03/18/2016 1132    COAGS: Lab Results  Component Value Date   INR 2.44 03/18/2016   INR 2.84 03/05/2016     ASSESSMENT/PLAN: This is a 76 y.o. female here with pain in her left foot that did have previous numbness but has resolved. She does take Coumadin has an INR 2.4 has a strong signal at the peroneal artery at the level of the ankle. This appears to be a subacute issue. She will room angiogram with her elevated INR will at this trend down possibly give her FFP in the morning if it remains elevated. She needs to be nothing by mouth after midnight for possible angiogram tomorrow.   Lilygrace Rodick C. Donzetta Matters, MD [facility] Office: [phone] Pager: [phone]

## 2016-03-18 NOTE — ED Notes (Signed)
Pt now to be transferred to [facility] per new order

## 2016-03-18 NOTE — Progress Notes (Addendum)
ANTICOAGULATION CONSULT NOTE - Initial Consult  Pharmacy Consult for Warfarin Indication: atrial fibrillation  Allergies  Allergen Reactions  . Codeine Itching and Nausea And Vomiting  . Ambien  [Zolpidem]     Other reaction(s): Confusion Makes me crazy     Patient Measurements:    Height: 5'5" Weight (as of 03/05/2016): 64.4 kg   Vital Signs: Temp: 98.5 F (36.9 C) (03/05 1212) Temp Source: Rectal (03/05 1212) BP: 143/65 (03/05 1446) Pulse Rate: 80 (03/05 1446)  Labs:  Recent Labs  03/18/16 1132  HGB 9.8*  HCT 31.9*  PLT 265  LABPROT 26.9*  INR 2.44  CREATININE 1.11*    Estimated Creatinine Clearance: 38.8 mL/min (by C-G formula based on SCr of 1.11 mg/dL (H)).   Medical History: Past Medical History:  Diagnosis Date  . Asthma   . CHF (congestive heart failure) ([place])   . COPD (chronic obstructive pulmonary disease) ([place])   . Diabetes mellitus without complication ([place])   . GERD (gastroesophageal reflux disease)   . Hx of right BKA ([place])   . Hypertension      Assessment: 75 y/oF on chronic warfarin therapy for atrial fibrillation who presents to [facility] ED on 03/18/2016 with weakness, shortness of breath, and left foot pain. Pharmacy consulted to manage warfarin dosing while patient in the hospital. INR therapeutic at 2.44 on admission. CBC reveals Hgb 9.8, stable based on most recent values but trending down slowly over past several months (MD notes baseline Hgb of 11), Pltc WNL. No bleeding issues currently reported. Home warfarin dose reported as 4mg  alternating with 6mg  daily, with last dose of 4mg  taken on 03/17/2016.  Goal of Therapy:  INR 2-3 Monitor platelets by anticoagulation protocol: Yes   Plan:  Warfarin 6mg  PO x 1 as per home regimen.  Daily PT/INR. Monitor CBC at least q72h and for s/sx of bleeding.   Lindell Spar, PharmD, BCPS Pager: [phone] 03/18/2016 3:06 PM   Addendum:  Damaris Schooner to Dr. Donzetta Matters with Vascular Surgery-plan to HOLD warfarin  tonight for possible angiogram in AM.   Lindell Spar, PharmD, BCPS Pager: [phone] 03/18/2016 7:44 PM

## 2016-03-18 NOTE — ED Notes (Signed)
Verified bed at [facility]

## 2016-03-19 ENCOUNTER — Other Ambulatory Visit (HOSPITAL_COMMUNITY): Payer: Medicare HMO

## 2016-03-19 ENCOUNTER — Inpatient Hospital Stay (HOSPITAL_COMMUNITY): Payer: Medicare HMO

## 2016-03-19 ENCOUNTER — Encounter (HOSPITAL_COMMUNITY)
Admission: EM | Disposition: A | Discharge status: Skilled Nursing Facility | Payer: Self-pay | Source: Home / Self Care | Attending: Internal Medicine

## 2016-03-19 DIAGNOSIS — K219 Gastro-esophageal reflux disease without esophagitis: Secondary | ICD-10-CM

## 2016-03-19 DIAGNOSIS — I481 Persistent atrial fibrillation: Secondary | ICD-10-CM

## 2016-03-19 DIAGNOSIS — J410 Simple chronic bronchitis: Secondary | ICD-10-CM

## 2016-03-19 DIAGNOSIS — Z89511 Acquired absence of right leg below knee: Secondary | ICD-10-CM

## 2016-03-19 DIAGNOSIS — I70245 Atherosclerosis of native arteries of left leg with ulceration of other part of foot: Secondary | ICD-10-CM

## 2016-03-19 DIAGNOSIS — I5043 Acute on chronic combined systolic (congestive) and diastolic (congestive) heart failure: Secondary | ICD-10-CM

## 2016-03-19 HISTORY — PX: LOWER EXTREMITY ANGIOGRAPHY: CATH118251

## 2016-03-19 HISTORY — PX: PERIPHERAL VASCULAR INTERVENTION: CATH118257

## 2016-03-19 LAB — CBC
HEMATOCRIT: 31.8 % — AB (ref 36.0–46.0)
HEMOGLOBIN: 9.8 g/dL — AB (ref 12.0–15.0)
MCH: 21.3 pg — AB (ref 26.0–34.0)
MCHC: 30.8 g/dL (ref 30.0–36.0)
MCV: 69 fL — AB (ref 78.0–100.0)
Platelets: 221 10*3/uL (ref 150–400)
RBC: 4.61 MIL/uL (ref 3.87–5.11)
RDW: 19 % — ABNORMAL HIGH (ref 11.5–15.5)
WBC: 5.7 10*3/uL (ref 4.0–10.5)

## 2016-03-19 LAB — COMPREHENSIVE METABOLIC PANEL
ALT: 15 U/L (ref 14–54)
AST: 27 U/L (ref 15–41)
Albumin: 3.3 g/dL — ABNORMAL LOW (ref 3.5–5.0)
Alkaline Phosphatase: 70 U/L (ref 38–126)
Anion gap: 11 (ref 5–15)
BUN: 26 mg/dL — ABNORMAL HIGH (ref 6–20)
CO2: 23 mmol/L (ref 22–32)
CREATININE: 1.15 mg/dL — AB (ref 0.44–1.00)
Calcium: 9 mg/dL (ref 8.9–10.3)
Chloride: 104 mmol/L (ref 101–111)
GFR, EST AFRICAN AMERICAN: 52 mL/min — AB (ref >60)
GFR, EST NON AFRICAN AMERICAN: 45 mL/min — AB (ref >60)
Glucose, Bld: 123 mg/dL — ABNORMAL HIGH (ref 65–99)
Potassium: 4.1 mmol/L (ref 3.5–5.1)
Sodium: 138 mmol/L (ref 135–145)
Total Bilirubin: 1.2 mg/dL (ref 0.3–1.2)
Total Protein: 6.6 g/dL (ref 6.5–8.1)

## 2016-03-19 LAB — POCT ACTIVATED CLOTTING TIME
ACTIVATED CLOTTING TIME: 235 s
Activated Clotting Time: 224 seconds
Activated Clotting Time: 186 seconds

## 2016-03-19 LAB — PROTIME-INR
INR: 2.18
INR: 1.71
PROTHROMBIN TIME: 24.6 s — AB (ref 11.4–15.2)
Prothrombin Time: 20.3 seconds — ABNORMAL HIGH (ref 11.4–15.2)

## 2016-03-19 LAB — GLUCOSE, CAPILLARY
GLUCOSE-CAPILLARY: 136 mg/dL — AB (ref 65–99)
Glucose-Capillary: 120 mg/dL — ABNORMAL HIGH (ref 65–99)
Glucose-Capillary: 117 mg/dL — ABNORMAL HIGH (ref 65–99)
Glucose-Capillary: 195 mg/dL — ABNORMAL HIGH (ref 65–99)

## 2016-03-19 LAB — ABO/RH: ABO/RH(D): A NEG

## 2016-03-19 LAB — MAGNESIUM: MAGNESIUM: 1.7 mg/dL (ref 1.7–2.4)

## 2016-03-19 SURGERY — LOWER EXTREMITY ANGIOGRAPHY

## 2016-03-19 MED ORDER — HEPARIN SODIUM (PORCINE) 1000 UNIT/ML IJ SOLN
INTRAMUSCULAR | Status: DC | PRN
  Administered 2016-03-19: 1000 [IU] via INTRAVENOUS
  Administered 2016-03-19: 7000 [IU] via INTRAVENOUS

## 2016-03-19 MED ORDER — SODIUM CHLORIDE 0.9 % IV SOLN
INTRAVENOUS | Status: DC | PRN
  Administered 2016-03-19: 10 mL/h via INTRAVENOUS

## 2016-03-19 MED ORDER — SODIUM CHLORIDE 0.9 % IV SOLN: INTRAVENOUS | Status: DC

## 2016-03-19 MED ORDER — FENTANYL CITRATE (PF) 100 MCG/2ML IJ SOLN
INTRAMUSCULAR | Status: DC | PRN
  Administered 2016-03-19 (×4): 25 ug via INTRAVENOUS

## 2016-03-19 MED ORDER — GUAIFENESIN-DM 100-10 MG/5ML PO SYRP: 15.0000 mL | ORAL_SOLUTION | ORAL | Status: DC | PRN

## 2016-03-19 MED ORDER — ACETAMINOPHEN 325 MG PO TABS
325.0000 mg | ORAL_TABLET | ORAL | Status: DC | PRN
  Administered 2016-03-20 (×2): 650 mg via ORAL
  Filled 2016-03-19 (×2): qty 2

## 2016-03-19 MED ORDER — HEPARIN (PORCINE) IN NACL 2-0.9 UNIT/ML-% IJ SOLN
INTRAMUSCULAR | Status: AC
  Filled 2016-03-19: qty 1000

## 2016-03-19 MED ORDER — MIDAZOLAM HCL 2 MG/2ML IJ SOLN
INTRAMUSCULAR | Status: AC
  Filled 2016-03-19: qty 2

## 2016-03-19 MED ORDER — SODIUM CHLORIDE 0.9 % IV SOLN: Freq: Once | INTRAVENOUS | Status: DC

## 2016-03-19 MED ORDER — ONDANSETRON HCL 4 MG/2ML IJ SOLN: 4.0000 mg | Freq: Four times a day (QID) | INTRAMUSCULAR | Status: DC | PRN

## 2016-03-19 MED ORDER — ALUM & MAG HYDROXIDE-SIMETH 200-200-20 MG/5ML PO SUSP
15.0000 mL | ORAL | Status: DC | PRN
  Administered 2016-03-22: 15 mL via ORAL
  Filled 2016-03-19: qty 30

## 2016-03-19 MED ORDER — SODIUM CHLORIDE 0.9 % IV SOLN: 500.0000 mL | Freq: Once | INTRAVENOUS | Status: DC | PRN

## 2016-03-19 MED ORDER — ACETAMINOPHEN 650 MG RE SUPP: 325.0000 mg | RECTAL | Status: DC | PRN

## 2016-03-19 MED ORDER — LABETALOL HCL 5 MG/ML IV SOLN: 10.0000 mg | INTRAVENOUS | Status: DC | PRN

## 2016-03-19 MED ORDER — LIDOCAINE-EPINEPHRINE (PF) 1 %-1:200000 IJ SOLN
30.0000 mL | INTRAMUSCULAR | Status: AC
  Administered 2016-03-19: 2 mL
  Filled 2016-03-19: qty 30

## 2016-03-19 MED ORDER — IODIXANOL 320 MG/ML IV SOLN
INTRAVENOUS | Status: DC | PRN
  Administered 2016-03-19: 130 mL via INTRA_ARTERIAL

## 2016-03-19 MED ORDER — HEPARIN (PORCINE) IN NACL 2-0.9 UNIT/ML-% IJ SOLN
INTRAMUSCULAR | Status: DC | PRN
  Administered 2016-03-19: 1000 mL

## 2016-03-19 MED ORDER — PHENOL 1.4 % MT LIQD: 1.0000 | OROMUCOSAL | Status: DC | PRN

## 2016-03-19 MED ORDER — LIDOCAINE HCL (PF) 1 % IJ SOLN
INTRAMUSCULAR | Status: AC
  Filled 2016-03-19: qty 30

## 2016-03-19 MED ORDER — DOCUSATE SODIUM 100 MG PO CAPS: 100.0000 mg | ORAL_CAPSULE | Freq: Every day | ORAL | Status: DC

## 2016-03-19 MED ORDER — LIDOCAINE-EPINEPHRINE 1 %-1:100000 IJ SOLN
30.0000 mL | Freq: Once | INTRAMUSCULAR | Status: DC
  Filled 2016-03-19: qty 30

## 2016-03-19 MED ORDER — METOPROLOL TARTRATE 5 MG/5ML IV SOLN: 2.0000 mg | INTRAVENOUS | Status: DC | PRN

## 2016-03-19 MED ORDER — HYDRALAZINE HCL 20 MG/ML IJ SOLN: 5.0000 mg | INTRAMUSCULAR | Status: DC | PRN

## 2016-03-19 MED ORDER — FENTANYL CITRATE (PF) 100 MCG/2ML IJ SOLN
INTRAMUSCULAR | Status: AC
  Filled 2016-03-19: qty 2

## 2016-03-19 MED ORDER — HEPARIN SODIUM (PORCINE) 1000 UNIT/ML IJ SOLN
INTRAMUSCULAR | Status: AC
  Filled 2016-03-19: qty 1

## 2016-03-19 MED ORDER — LIDOCAINE HCL (PF) 1 % IJ SOLN
INTRAMUSCULAR | Status: DC | PRN
  Administered 2016-03-19: 20 mL via SUBCUTANEOUS
  Administered 2016-03-19: 15 mL via SUBCUTANEOUS

## 2016-03-19 MED ORDER — MIDAZOLAM HCL 2 MG/2ML IJ SOLN
INTRAMUSCULAR | Status: DC | PRN
  Administered 2016-03-19 (×2): 0.5 mg via INTRAVENOUS

## 2016-03-19 SURGICAL SUPPLY — 28 items
BALLN ARMADA 4X60X135 (BALLOONS) ×4
BALLN ARMADA 6X40X80 (BALLOONS) ×4
BALLN ARMADA 6X60X80 (BALLOONS) ×4
BALLOON ARMADA 4X60X135 (BALLOONS) IMPLANT
BALLOON ARMADA 6X40X80 (BALLOONS) IMPLANT
BALLOON ARMADA 6X60X80 (BALLOONS) ×1 IMPLANT
CATH ANGIO 5F BER2 65CM (CATHETERS) ×3 IMPLANT
CATH CROSS OVER TEMPO 5F (CATHETERS) ×2 IMPLANT
CATH OMNI FLUSH 5F 65CM (CATHETERS) ×3 IMPLANT
CATH SOFT-VU 4F 65 STRAIGHT (CATHETERS) ×1 IMPLANT
CATH SOFT-VU STRAIGHT 4F 65CM (CATHETERS) ×4
DEVICE CONTINUOUS FLUSH (MISCELLANEOUS) ×3 IMPLANT
DEVICE ONE SNARE 10MM (MISCELLANEOUS) ×2 IMPLANT
DEVICE TORQUE H2O (MISCELLANEOUS) ×3 IMPLANT
GUIDEWIRE ANGLED .035X150CM (WIRE) ×2 IMPLANT
KIT ENCORE 26 ADVANTAGE (KITS) ×6 IMPLANT
KIT PV (KITS) ×4 IMPLANT
SHEATH BRITE TIP 7FR 35CM (SHEATH) ×2 IMPLANT
SHEATH PINNACLE 5F 10CM (SHEATH) ×6 IMPLANT
STENT ICAST 7X38X80 (Permanent Stent) ×2 IMPLANT
STENT ICAST 8X38X80 (Permanent Stent) ×2 IMPLANT
SYR MEDRAD MARK V 150ML (SYRINGE) ×4 IMPLANT
TRANSDUCER W/STOPCOCK (MISCELLANEOUS) ×4 IMPLANT
TRAY PV CATH (CUSTOM PROCEDURE TRAY) ×4 IMPLANT
WIRE AMPLATZ SSTIFF .035X260CM (WIRE) ×2 IMPLANT
WIRE BENTSON .035X145CM (WIRE) ×4 IMPLANT
WIRE MINI STICK MAX (SHEATH) ×4 IMPLANT
WIRE ROSEN-J .035X260CM (WIRE) ×6 IMPLANT

## 2016-03-19 NOTE — Progress Notes (Signed)
PROGRESS NOTE    Sarah Moss  [id number] DOB: 09/10/1940 DOA: 03/18/2016 PCP: [name], PA-C    Brief Narrative:  76 year old female with a history of HTN, DM, A-fib with tachy brady s/p pacemaker, hypothyroidism, diastolic HF, , COPD (not O2 dependent) and right BKA from an accident, PAD s/p stent in left leg (followed by vascular in [place]) presents with c/o weakness and shortness of breath. She has progressive shortness of breath that "comes and goes" and is worse with activity over the last 1 month. She denies any chest pain. She denies any palpitations or lightheadedness. She was most recently hospitalized and discharged from [facility] on 12/26/15 for CHF exacerbation. Most recent 2-D echo 10/02/15, showed low normal LV systolic function [id number], moderate MR and TR, moderate pulmonary hypertension, biatrial enlargement,  Patient also complains of left foot pain. Patient was recently evaluated by the vascular surgery on 03/08/16 Dr. Kathreen Cosier, [name], MD, [facility] .Exam indicates significantly abnormal PVR. Suggests superficial femoral artery occlusion. Her left ABI is non-compressible, though her toe pressure of 60 is adequate for wound healing. Her left ankle waveform is pulsatile, but slightly dampened. Her symptoms right now are not consistent with ischemia rest pain and she has no tissue loss. Compression stockings were recommended. Angiogram was recommended, she would like to hold off on it. Patient was planned to have a 3 week follow-up with her vascular surgeon. ED course BP 131/62  Pulse 79  Temp 97.6 F (36.4 C) (Oral)  Resp 20  SpO2 98% Chest x-ray shows central mild vascular congestion without pulmonary edema Hemoglobin 9.8, MCV 69.0, creatinine 1.11, BNP 765.5, troponin 0.02, INR 2.44, UA negative, Patient evaluated for shortness of breath, left foot pain.    Assessment & Plan:   Principal Problem:   CHF (congestive heart  failure) (HCC) Active Problems:   Atrial fibrillation (HCC)   Hx of right BKA (HCC)   COPD (chronic obstructive pulmonary disease) ([place])   Diabetes mellitus ([place])   Gastroesophageal reflux disease   Acute on chronic heart failure (HCC)  #1 acute on chronic CHF exacerbation Patient likely with acute on chronic diastolic heart failure presented with shortness of breath lower extremity edema. Patient with 2-D echo done on 09/30/2016. Repeat 2-D echo pending. Patient with urine output of 850 mL over the past 24 hours. Continue Lasix 40 mg IV every 12 hours. Will likely need outpatient follow-up with her cardiologist.  #2 left foot pain Uric acid level elevated. ABIs with left ABI of 0.27 suggestive of critical O closing disease at rest. Patient has been seen in consultation by vascular surgery and patient for aortogram with left leg angiogram and possible intervention today per vascular surgery.  #3 history of atrial fibrillation Continue Cardizem and atenolol for rate control. Coumadin on hold as being reversed for possible procedure by vascular surgery. Will defer resumption of anticoagulation to vascular surgery.  #4 COPD Stable. Continue Dulera, singular, PPI,   #5 gastroesophageal reflux disease PPI.  #6 diabetes mellitus SSI  #7 hypothyroidism Continue home dose Synthroid.  #8 hypertension Continue lisinopril, Cardizem, Lasix, atenolol.  #9 iron deficiency anemia H&H stable. Will likely need oral iron supplementation on discharge. Outpatient follow-up with GI.  #10 history of right BKA    DVT prophylaxis: On Coumadin. Code Status: Full Family Communication: Updated patient and family at bedside. Disposition Plan: Home when symptoms have resolved and heart failure improved and patient on oral diuretics and per vascular surgery.  Consultants:   Vascular surgery: Dr. Donzetta Matters 03/18/2016  Procedures:   ABIs 03/18/2016  2-D echo 03/19/2016  Chest x-ray  03/18/2016  Aortogram with left leg angiogram and possible intervention pending 03/19/2016  2 units FFP 03/19/2016  Antimicrobials:   None   Subjective: Patient asking for food. States no significant change in SOB.  Objective: Vitals:   03/19/16 0027 03/19/16 0356 03/19/16 1027 03/19/16 1057  BP: 138/76 128/64 132/83 129/76  Pulse: 80 79 79 81  Resp: 18 20 20 20   Temp: 98.6 F (37 C) 98.4 F (36.9 C) 98.2 F (36.8 C) 98.2 F (36.8 C)  TempSrc: Oral Oral Oral Oral  SpO2: 96% 96% 96% 100%  Weight:  67.4 kg (148 lb 11.2 oz)    Height:        Intake/Output Summary (Last 24 hours) at 03/19/16 1240 Last data filed at 03/19/16 0843  Gross per 24 hour  Intake              290 ml  Output             1100 ml  Net             -810 ml   Filed Weights   03/18/16 1838 03/19/16 0356  Weight: 69.8 kg (153 lb 12.8 oz) 67.4 kg (148 lb 11.2 oz)    Examination:  General exam: Appears calm and comfortable  Respiratory system: Clear to auscultation. Respiratory effort normal. Cardiovascular system: S1 & S2 heard, RRR. No JVD, murmurs, rubs, gallops or clicks. No pedal edema. Gastrointestinal system: Abdomen is nondistended, soft and nontender. No organomegaly or masses felt. Normal bowel sounds heard. Central nervous system: Alert and oriented. No focal neurological deficits. Extremities: s/p R BKA. LLE with 2+ edema, left foot less rubor, some ttp. Skin: No rashes, lesions or ulcers Psychiatry: Judgement and insight appear normal. Mood & affect appropriate.     Data Reviewed: I have personally reviewed following labs and imaging studies  CBC:  Recent Labs Lab 03/18/16 1132 03/19/16 1024  WBC 8.0 5.7  NEUTROABS 5.0  --   HGB 9.8* 9.8*  HCT 31.9* 31.8*  MCV 69.0* 69.0*  PLT 265 [id number]   Basic Metabolic Panel:  Recent Labs Lab 03/18/16 1132 03/19/16 0807 03/19/16 1024  NA 139  --  138  K 4.3  --  4.1  CL 106  --  104  CO2 22  --  23  GLUCOSE 137*  --  123*  BUN  27*  --  26*  CREATININE 1.11*  --  1.15*  CALCIUM 9.0  --  9.0  MG 1.5* 1.7  --    GFR: Estimated Creatinine Clearance: 37.4 mL/min (by C-G formula based on SCr of 1.15 mg/dL (H)). Liver Function Tests:  Recent Labs Lab 03/18/16 1132 03/19/16 1024  AST 41 27  ALT 17 15  ALKPHOS 75 70  BILITOT 1.0 1.2  PROT 7.5 6.6  ALBUMIN 3.8 3.3*   No results for input(s): LIPASE, AMYLASE in the last 168 hours. No results for input(s): AMMONIA in the last 168 hours. Coagulation Profile:  Recent Labs Lab 03/18/16 1132 03/19/16 1024  INR 2.44 2.18   Cardiac Enzymes: No results for input(s): CKTOTAL, CKMB, CKMBINDEX, TROPONINI in the last 168 hours. BNP (last 3 results) No results for input(s): PROBNP in the last 8760 hours. HbA1C: No results for input(s): HGBA1C in the last 72 hours. CBG:  Recent Labs Lab 03/18/16 1704 03/18/16 2111 03/19/16 0759 03/19/16 1155  GLUCAP 125* 226* 136* 120*   Lipid Profile: No results for input(s): CHOL, HDL, LDLCALC, TRIG, CHOLHDL, LDLDIRECT in the last 72 hours. Thyroid Function Tests:  Recent Labs  03/18/16 1618  TSH 4.251  FREET4 1.40*   Anemia Panel:  Recent Labs  03/18/16 1618  VITAMINB12 307  FOLATE 22.1  FERRITIN 10*  TIBC 546*  IRON 14*  RETICCTPCT 2.1   Sepsis Labs: No results for input(s): PROCALCITON, LATICACIDVEN in the last 168 hours.  No results found for this or any previous visit (from the past 240 hour(s)).       Radiology Studies: Dg Chest 2 View  Result Date: 03/18/2016 CLINICAL DATA:  Generalized weakness, shortness of breath for 2 days EXAM: CHEST  2 VIEW COMPARISON:  03/05/2016 FINDINGS: Cardiomegaly again noted. Dual lead cardiac pacemaker is unchanged in position. Central mild vascular congestion without convincing pulmonary edema. Stable trace bilateral pleural effusion with basilar atelectasis. Osteopenia and mild degenerative changes thoracic spine. Atherosclerotic calcifications of thoracic  aorta. No segmental infiltrate. IMPRESSION: Central mild vascular congestion without convincing pulmonary edema. Stable trace bilateral pleural effusion with basilar atelectasis. No segmental infiltrates. Electronically Signed   By: Lahoma Crocker M.D.   On: 03/18/2016 11:10        Scheduled Meds: . aspirin EC  81 mg Oral Daily  . atenolol  25 mg Oral Daily  . atorvastatin  40 mg Oral q1800  . diltiazem  120 mg Oral Daily  . DULoxetine  60 mg Oral Daily  . furosemide  40 mg Intravenous Q12H  . gabapentin  300 mg Oral QHS  . insulin aspart  0-9 Units Subcutaneous TID WC  . levothyroxine  25 mcg Oral QAC breakfast  . lisinopril  20 mg Oral Daily  . mometasone-formoterol  2 puff Inhalation BID  . montelukast  10 mg Oral QHS  . pantoprazole  40 mg Oral Daily  . sodium chloride flush  3 mL Intravenous Q12H   Continuous Infusions:   LOS: 1 day    Time spent: 35 minutes    Latiffany Harwick, MD [facility] Pager [phone]  If 7PM-7AM, please contact night-coverage [email or web address] Password [email or web address] 03/19/2016, 12:40 PM

## 2016-03-19 NOTE — Progress Notes (Signed)
Site area: Right groin a 7 french arterial sheath was removed  Site Prior to Removal:  Level 0  Pressure Applied For 30 MINUTES    Bedrest Beginning at 2030p  Manual:   Yes.    Patient Status During Pull:  stable  Post Pull Groin Site:  Level 0  Post Pull Instructions Given:  Yes.    Post Pull Pulses Present:  Yes.    Dressing Applied:  Yes.  Pressure dressing applied.  Comments:  VS remain stable during sheath pull

## 2016-03-19 NOTE — Interval H&P Note (Signed)
History and Physical Interval Note:  03/19/2016 2:16 PM  Sarah Moss  has presented today for surgery, with the diagnosis of pvd w/ wounds  The various methods of treatment have been discussed with the patient and family. After consideration of risks, benefits and other options for treatment, the patient has consented to  Procedure(s): Lower Extremity Angiography (N/A) as a surgical intervention .  The patient's history has been reviewed, patient examined, no change in status, stable for surgery.  I have reviewed the patient's chart and labs.  Questions were answered to the patient's satisfaction.     Annamarie Major

## 2016-03-19 NOTE — Progress Notes (Signed)
[facility]  Patient Status: Active (receiving services up to time of hospitalization)  [facility] is providing the following services: RN, OT and MSW  If patient discharges after hours, please call [phone].   Sarah Moss 03/19/2016, 12:38 PM

## 2016-03-19 NOTE — H&P (View-Only) (Signed)
Hospital Consult    Reason for Consult:  Left leg ischemia Referring Physician:  ED MRN #:  [id number]  History of Present Illness: This is a 76 y.o. female with history of a traumatic right lower extremity amputation had a history of reported left SFA stenting. She now presents with a 3-5 day history of left lower extremity pain. She is followed by vascular surgery [place] who told her that the leg is fine.  Her pain was associated with skin changes that include blotchiness to the toes that she has not had before. She also has associated numbness but that has resolved. She cannot feel her toes fine to just move them well. She does ambulate with the use of a right-sided prosthetic which she is wearing about today's interview. She has not any fevers chills nausea vomiting or other constitutional symptoms. She has never had issues like this before. She does not note alleviating or exacerbating factors. She's not had rest pain or claudication leading up to this. She does not have tissue loss or ulceration. She does have atrial fibrillation for which she takes Coumadin. She is diabetic and former smoker having quit 45 years ago. She is a widow as of 2 years ago.  Past Medical History:  Diagnosis Date  . Asthma   . CHF (congestive heart failure) ([place])   . COPD (chronic obstructive pulmonary disease) ([place])   . Diabetes mellitus without complication ([place])   . GERD (gastroesophageal reflux disease)   . Hx of right BKA ([place])   . Hypertension     History reviewed. No pertinent surgical history.  Allergies  Allergen Reactions  . Codeine Itching and Nausea And Vomiting  . Ambien  [Zolpidem]     Other reaction(s): Confusion Makes me crazy     Prior to Admission medications   Medication Sig Start Date End Date Taking? Authorizing Provider  ADVAIR DISKUS 250-50 MCG/DOSE AEPB Inhale 1 puff into the lungs 2 (two) times daily. 03/08/16  Yes Historical Provider, MD  ALPRAZolam Duanne Moron) 0.25 MG  tablet Take 0.25 mg by mouth every 12 (twelve) hours as needed for anxiety.   Yes Historical Provider, MD  atenolol (TENORMIN) 25 MG tablet Take 25 mg by mouth daily. 02/22/15  Yes Historical Provider, MD  atorvastatin (LIPITOR) 40 MG tablet Take 40 mg by mouth daily. 05/30/14  Yes Historical Provider, MD  diltiazem (TIAZAC) 120 MG 24 hr capsule Take 120 mg by mouth daily. 02/22/15  Yes Historical Provider, MD  DULoxetine (CYMBALTA) 60 MG capsule Take 60 mg by mouth daily.   Yes Historical Provider, MD  furosemide (LASIX) 40 MG tablet Take 40 mg by mouth daily.   Yes Historical Provider, MD  gabapentin (NEURONTIN) 300 MG capsule Take 300 mg by mouth at bedtime. 05/30/14  Yes Historical Provider, MD  levothyroxine (SYNTHROID, LEVOTHROID) 25 MCG tablet Take 25 mcg by mouth daily. 05/30/14  Yes Historical Provider, MD  lisinopril (PRINIVIL,ZESTRIL) 20 MG tablet Take 20 mg by mouth daily.   Yes Historical Provider, MD  metFORMIN (GLUCOPHAGE) 1000 MG tablet Take 1,000 mg by mouth 2 (two) times daily. 05/30/14  Yes Historical Provider, MD  montelukast (SINGULAIR) 10 MG tablet Take 10 mg by mouth at bedtime.   Yes Historical Provider, MD  nitroGLYCERIN (NITROSTAT) 0.4 MG SL tablet Place 0.4 mg under the tongue every 5 (five) minutes as needed for chest pain.   Yes Historical Provider, MD  omeprazole (PRILOSEC) 40 MG capsule Take 40 mg by mouth daily.   Yes  Historical Provider, MD  potassium chloride (K-DUR) 10 MEQ tablet Take 10 mEq by mouth 2 (two) times daily. For 30 days 02/12/16  Yes Historical Provider, MD  VENTOLIN HFA 108 (90 Base) MCG/ACT inhaler Inhale 1 puff into the lungs every 6 (six) hours as needed for shortness of breath.  03/08/16  Yes Historical Provider, MD  warfarin (COUMADIN) 4 MG tablet Take 4-6 mg by mouth daily. Patient alternates between 4 mg and 6 mg daily. Patient had 4 mg on 03/17/16 07/04/15 07/03/16 Yes Historical Provider, MD    Social History   Social History  . Marital status: Married     Spouse name: N/A  . Number of children: N/A  . Years of education: N/A   Occupational History  . Not on file.   Social History Main Topics  . Smoking status: Former Research scientist (life sciences)  . Smokeless tobacco: Never Used  . Alcohol use Not on file  . Drug use: Unknown  . Sexual activity: Not on file   Other Topics Concern  . Not on file   Social History Narrative  . No narrative on file     History reviewed. No pertinent family history.  ROS: [x]  Positive   [ ]  Negative   [ ]  All sytems reviewed and are negative  Cardiovascular: []  chest pain/pressure []  palpitations [x]  SOB lying flat []  DOE []  pain in legs while walking []  pain in legs at rest []  pain in legs at night []  non-healing ulcers []  hx of DVT []  swelling in legs  Pulmonary: []  productive cough []  asthma/wheezing []  home O2  Neurologic: []  weakness in []  arms []  legs [x numbness in []  arms [x]  legs []  hx of CVA []  mini stroke [] difficulty speaking or slurred speech []  temporary loss of vision in one eye []  dizziness  Hematologic: []  hx of cancer []  bleeding problems []  problems with blood clotting easily  Endocrine:   [x]  diabetes []  thyroid disease  GI []  vomiting blood []  blood in stool  GU: []  CKD/renal failure []  HD--[]  M/W/F or []  T/T/S []  burning with urination []  blood in urine  Psychiatric: []  anxiety []  depression  Musculoskeletal: []  arthritis []  joint pain  Integumentary: [x]  rashes []  ulcers  Constitutional: []  fever []  chills   Physical Examination  Vitals:   03/18/16 1745 03/18/16 1838  BP:  (!) 141/59  Pulse: 79 80  Resp: 20 20  Temp:  98.3 F (36.8 C)   Body mass index is 25.59 kg/m.  General:  WDWN in NAD Gait: Not observed HENT: WNL, normocephalic Pulmonary: normal non-labored breathing Cardiac: regular rate, palpable left femoral pulse 2+, right is 1+ There is a multiphasic left peroneal signal and monophasic dp/pt Abdomen: soft, NT/ND, no  masses Extremities: skin changes to distal left foot  Musculoskeletal: no muscle wasting or atrophy  Neurologic: left foot is sensory and motor in tact Psychiatric:  Appropriate affect  CBC    Component Value Date/Time   WBC 8.0 03/18/2016 1132   RBC 4.59 03/18/2016 1618   RBC 4.62 03/18/2016 1132   HGB 9.8 (L) 03/18/2016 1132   HCT 31.9 (L) 03/18/2016 1132   PLT 265 03/18/2016 1132   MCV 69.0 (L) 03/18/2016 1132   MCH 21.2 (L) 03/18/2016 1132   MCHC 30.7 03/18/2016 1132   RDW 19.3 (H) 03/18/2016 1132   LYMPHSABS 1.8 03/18/2016 1132   MONOABS 1.0 03/18/2016 1132   EOSABS 0.2 03/18/2016 1132   BASOSABS 0.0 03/18/2016 1132    BMET  Component Value Date/Time   NA 139 03/18/2016 1132   K 4.3 03/18/2016 1132   CL 106 03/18/2016 1132   CO2 22 03/18/2016 1132   GLUCOSE 137 (H) 03/18/2016 1132   BUN 27 (H) 03/18/2016 1132   CREATININE 1.11 (H) 03/18/2016 1132   CALCIUM 9.0 03/18/2016 1132   GFRNONAA 47 (L) 03/18/2016 1132   GFRAA 54 (L) 03/18/2016 1132    COAGS: Lab Results  Component Value Date   INR 2.44 03/18/2016   INR 2.84 03/05/2016     ASSESSMENT/PLAN: This is a 76 y.o. female here with pain in her left foot that did have previous numbness but has resolved. She does take Coumadin has an INR 2.4 has a strong signal at the peroneal artery at the level of the ankle. This appears to be a subacute issue. She will room angiogram with her elevated INR will at this trend down possibly give her FFP in the morning if it remains elevated. She needs to be nothing by mouth after midnight for possible angiogram tomorrow.   Brandon C. Donzetta Matters, MD [facility] [place] Office: [phone] Pager: [phone]

## 2016-03-19 NOTE — Care Management Note (Signed)
Case Management Note  Patient Details  Name: Sarah Moss MRN: [id number] Date of Birth: 07-Jan-1941  Subjective/Objective:          Admitted with CHF         Action/Plan: Patient lives at home with her sister; PCP: [name], PA-C; has private insurance with [facility] Medicare with prescription drug coverage; is active with [place] for [facility], [place] and SW; awaiting on physical therapy eval for possible SNF placement  Expected Discharge Date: possible 03/23/2016           Expected Discharge Plan:  [place]  Discharge planning Services  CM Consult  Status of Service:  In process, will continue to follow  Sherrilyn Rist [id number] 03/19/2016, 12:04 PM

## 2016-03-19 NOTE — Progress Notes (Addendum)
  Progress Note    03/19/2016 7:39 AM * No surgery date entered *  Subjective:  No acute issues  Vitals:   03/19/16 0027 03/19/16 0356  BP: 138/76 128/64  Pulse: 80 79  Resp: 18 20  Temp: 98.6 F (37 C) 98.4 F (36.9 C)    Physical Exam: aaox3 Non labored respirations Left foot with early ischemic changes  CBC    Component Value Date/Time   WBC 8.0 03/18/2016 1132   RBC 4.59 03/18/2016 1618   RBC 4.62 03/18/2016 1132   HGB 9.8 (L) 03/18/2016 1132   HCT 31.9 (L) 03/18/2016 1132   PLT 265 03/18/2016 1132   MCV 69.0 (L) 03/18/2016 1132   MCH 21.2 (L) 03/18/2016 1132   MCHC 30.7 03/18/2016 1132   RDW 19.3 (H) 03/18/2016 1132   LYMPHSABS 1.8 03/18/2016 1132   MONOABS 1.0 03/18/2016 1132   EOSABS 0.2 03/18/2016 1132   BASOSABS 0.0 03/18/2016 1132    BMET    Component Value Date/Time   NA 139 03/18/2016 1132   K 4.3 03/18/2016 1132   CL 106 03/18/2016 1132   CO2 22 03/18/2016 1132   GLUCOSE 137 (H) 03/18/2016 1132   BUN 27 (H) 03/18/2016 1132   CREATININE 1.11 (H) 03/18/2016 1132   CALCIUM 9.0 03/18/2016 1132   GFRNONAA 47 (L) 03/18/2016 1132   GFRAA 54 (L) 03/18/2016 1132    INR    Component Value Date/Time   INR 2.44 03/18/2016 1132     Intake/Output Summary (Last 24 hours) at 03/19/16 0739 Last data filed at 03/19/16 0710  Gross per 24 hour  Intake              290 ml  Output             1100 ml  Net             -810 ml     Assessment:  76 y.o. female is here with rest pain in left foot, previous rle aka  Plan: Aortogram with left leg angiogram and possible intervention today 1 unit ffp ordered  Damien Batty C. Donzetta Matters, MD [facility] [place] Office: [phone] Pager: [phone]  03/19/2016 7:39 AM

## 2016-03-19 NOTE — Op Note (Signed)
Patient name: Sarah Moss MRN: [id number] DOB: [birth year] Sex: female  03/18/2016 - 03/19/2016 Pre-operative Diagnosis: Left foot ulcer Post-operative diagnosis:  Same Surgeon:  Annamarie Major Procedure Performed:  1.  Ultrasound-guided access right femoral artery  2.  Ultrasound-guided access, left femoral artery  3.  Abdominal aortogram  4.  Left lower extremity runoff  5.  Conscious sedation (128 minutes)  6.  Stent, left common iliac artery  7.  Stent, right common iliac artery   Indications:  The patient has been seen and evaluated.  She has new onset pain and numbness in her left foot.  She has a history of intervention at a referring institution.  Vascular lab studies showed significant reduction in blood flow therefore she comes in today for further evaluation and intervention as needed.  Her INR was 2.1 this morning.  She did get a unit of FFP today.  We have not have a repeat INR, however I hung another bag of FFP in order to proceed.  Procedure:  The patient was identified in the holding area and taken to room 8.  The patient was then placed supine on the table and prepped and draped in the usual sterile fashion.  A time out was called.  Conscious sedation was a minister with the use of IV fentanyl and Versed and a continuous physician and nurse monitoring.  Heart rate, blood pressure, and oxygen saturations were continuously monitored.  Ultrasound was used to evaluate the right common femoral artery.  It was patent, however it was very small and difficult to visualize .  A digital ultrasound image was acquired.  A micropuncture needle was used to access the right common femoral artery under ultrasound guidance.  An 018 wire was advanced without resistance and a micropuncture sheath was placed.  The 018 wire was removed and a benson wire was placed.  The micropuncture sheath was exchanged for a 5 french sheath.  I tried to advance a Berenstein 2 catheter into the aorta.  I was able  to make small advances.  Several injections were performed and ultimately I was convinced that I was in a dissection plane despite being able to see to the contralateral side.  At a point in time I elected to gain access on the left groin.  Again, the artery was difficult to visualize because of its small size.  There was pulsatility to the femoral vein.  The left common femoral artery was then cannulated under ultrasound guidance with a micropuncture needle.  A wire was advanced without resistance followed by placement of a micropuncture sheath.  An 035 wire was inserted followed by a 5 [place] sheath.  Next a pigtail catheter was advanced to the level of L1 and an abdominal aortogram was performed.  Sheath injections were done to evaluate the left leg. Findings:   Aortogram:  A right renal stent is visualized and felt to be patent.  There is native left renal artery stenosis greater than 50%.  The infrarenal abdominal aorta is calcified and small in caliber.  The right common iliac is occluded.  The right external iliac and internal iliac artery do opacify.  There is a stent within the left common iliac artery with diffuse stenosis throughout.  The left internal and external iliac arteries are patent.  Right Lower Extremity:  Not evaluated  Left Lower Extremity:  The left common femoral and profunda femoral artery are patent.  There is occlusion of the superficial femoral artery at its origin.  There is reconstitution of the proximal above-knee popliteal artery.  There are three-vessel runoff to the foot however these vessels are very very small.  Intervention:  After the above images were acquired the decision was made to proceed with intervention to the aortoiliac area.  The patient was fully heparinized.  Because of the dissection from the right side, I used a crossover catheter and a 4 French snare to snare the wire in the right external iliac artery.  I was able to a catheter wire back over the  bifurcation into the left common iliac artery.  I then used a Glidewire and got access into the aorta from the right side through the snare catheter.  A Amplatz wire was then placed.  On the left side a Rosen wire was inserted into the aorta.  A 7 French 35 cm bright tip sheaths were then inserted.  I was able to advance the left one into the aorta without difficulty.  On the right side I could not advance the sheath past the hypogastric artery.  I then selected a 4 x 60 balloon to predilate the area.  The balloon was inflated.  I advanced the balloon into the aorta there was a significant challenge getting the balloon into the aorta but ultimately this was  successful.  I then reinserted the dilator and tried to advance the sheath into the aorta but it was unable to go.  I then selected a 6 mm balloon to dilate the area.  After dilating this and having difficulty getting the balloon into the aorta, I reinserted the dilator and again could not get the sheath in position.  Next, I took the 6 mm balloon and put into the aorta and then swallowed the balloon advancing the sheath.  The furthest I couldn't advance the sheath was the proximal right common iliac artery.  At this point, I felt that I should proceed with stenting.  I felt the stent on the left iliac was undersized.  I did not get good results with ballooning and therefore I selected a 7 x 38 atrium covered stent and deployed this landing at the origin of the left common iliac artery extending through the previous deployed stent and landing in the distal common iliac artery.  I then at selected a 8 x 38 atrium covered stent for the right side.  With some force, I was able to advance the stent beyond the area where the sheath could not advance and therefore the stent was in the aorta.  I brought it back is much as I could so as not to compromise the contralateral left side.  The stent was deployed with its tip in the aorta slightly above the left common iliac  stent.  The patient had significant difficulty in pain when doing so.  A completion image showed that there was no evidence of extravasation and there is a widely patent bilateral common iliac arteries.  At this point I elected to stop the procedure as the patient was becoming very difficult to keep still.  Catheters and wires were removed.  The patient be taken the holding area for sheath pull once her coagulation profile corrects   Impression:  #1  patent right renal stent.  Greater than 50% left renal artery stenosis  #2  occluded right common iliac artery which was able to be recanalized and stented using an Atrium 8 x 38.  #3  significant in-stent stenosis within the left common iliac artery stented using a  Atrium 7 x 38 with residual stenosis less than 10%  #4  occlusion of the left superficial femoral artery with above-knee popliteal artery reconstitution and three-vessel runoff via 3 very small tibial vessels  #5  unclear if dissection into the right common iliac and aorta was iatrogenic or pre-existing.  It was resolved after intervention   V. Annamarie Major, M.D. [facility] [place] Office: [phone] Pager:  [phone]

## 2016-03-19 NOTE — Progress Notes (Addendum)
Site area: Left groin a 7 french arterial sheath was removed  Site Prior to Removal:  Level 0  Pressure Applied For 40 MINUTES    Bedrest Beginning at 2030p  Manual:   Yes.    Patient Status During Pull:  Stable  Post Pull Groin Site:  Level 0  Post Pull Instructions Given:  Yes.    Post Pull Pulses Present:  Yes.    Dressing Applied:  Yes.  Pressure dressing applied    Comments:  VS remain stable during sheath pull. Lido w/ epi 2 cc applied to site to promote hemostasis per MD order.

## 2016-03-20 ENCOUNTER — Inpatient Hospital Stay (HOSPITAL_COMMUNITY): Payer: Medicare HMO

## 2016-03-20 ENCOUNTER — Encounter (HOSPITAL_COMMUNITY): Payer: Self-pay | Admitting: Surgery

## 2016-03-20 DIAGNOSIS — I739 Peripheral vascular disease, unspecified: Secondary | ICD-10-CM

## 2016-03-20 DIAGNOSIS — Z01810 Encounter for preprocedural cardiovascular examination: Secondary | ICD-10-CM

## 2016-03-20 DIAGNOSIS — R0600 Dyspnea, unspecified: Secondary | ICD-10-CM

## 2016-03-20 DIAGNOSIS — J449 Chronic obstructive pulmonary disease, unspecified: Secondary | ICD-10-CM

## 2016-03-20 LAB — PREPARE FRESH FROZEN PLASMA
UNIT DIVISION: 0
Unit division: 0

## 2016-03-20 LAB — BPAM FFP
Blood Product Expiration Date: 201803072359
Blood Product Expiration Date: 201803072359
ISSUE DATE / TIME: 201803061027
ISSUE DATE / TIME: 201803061341
Unit Type and Rh: 6200
Unit Type and Rh: 6200

## 2016-03-20 LAB — BASIC METABOLIC PANEL
ANION GAP: 17 — AB (ref 5–15)
BUN: 26 mg/dL — AB (ref 6–20)
CHLORIDE: 99 mmol/L — AB (ref 101–111)
CO2: 22 mmol/L (ref 22–32)
Calcium: 9.1 mg/dL (ref 8.9–10.3)
Creatinine, Ser: 1.19 mg/dL — ABNORMAL HIGH (ref 0.44–1.00)
GFR calc Af Amer: 50 mL/min — ABNORMAL LOW (ref >60)
GFR, EST NON AFRICAN AMERICAN: 43 mL/min — AB (ref >60)
Glucose, Bld: 127 mg/dL — ABNORMAL HIGH (ref 65–99)
POTASSIUM: 4.2 mmol/L (ref 3.5–5.1)
Sodium: 138 mmol/L (ref 135–145)

## 2016-03-20 LAB — ECHOCARDIOGRAM COMPLETE
Height: 65 in
Weight: 2435.2 oz

## 2016-03-20 LAB — CBC
HEMATOCRIT: 31.2 % — AB (ref 36.0–46.0)
HEMOGLOBIN: 9.2 g/dL — AB (ref 12.0–15.0)
MCH: 20.5 pg — ABNORMAL LOW (ref 26.0–34.0)
MCHC: 29.5 g/dL — ABNORMAL LOW (ref 30.0–36.0)
MCV: 69.6 fL — AB (ref 78.0–100.0)
Platelets: 235 10*3/uL (ref 150–400)
RBC: 4.48 MIL/uL (ref 3.87–5.11)
RDW: 19.1 % — ABNORMAL HIGH (ref 11.5–15.5)
WBC: 7.5 10*3/uL (ref 4.0–10.5)

## 2016-03-20 LAB — GLUCOSE, CAPILLARY
GLUCOSE-CAPILLARY: 124 mg/dL — AB (ref 65–99)
GLUCOSE-CAPILLARY: 152 mg/dL — AB (ref 65–99)
Glucose-Capillary: 132 mg/dL — ABNORMAL HIGH (ref 65–99)
Glucose-Capillary: 169 mg/dL — ABNORMAL HIGH (ref 65–99)

## 2016-03-20 LAB — MAGNESIUM: Magnesium: 1.7 mg/dL (ref 1.7–2.4)

## 2016-03-20 LAB — PROTIME-INR
INR: 1.7
Prothrombin Time: 20.2 seconds — ABNORMAL HIGH (ref 11.4–15.2)

## 2016-03-20 MED ORDER — WARFARIN SODIUM 7.5 MG PO TABS: 7.5000 mg | ORAL_TABLET | Freq: Once | ORAL | Status: DC

## 2016-03-20 MED ORDER — SENNOSIDES-DOCUSATE SODIUM 8.6-50 MG PO TABS
1.0000 | ORAL_TABLET | Freq: Two times a day (BID) | ORAL | Status: DC
  Administered 2016-03-20 – 2016-03-22 (×5): 1 via ORAL
  Filled 2016-03-20 (×5): qty 1

## 2016-03-20 MED ORDER — WARFARIN SODIUM 7.5 MG PO TABS
7.5000 mg | ORAL_TABLET | Freq: Once | ORAL | Status: AC
  Administered 2016-03-20: 7.5 mg via ORAL
  Filled 2016-03-20: qty 1

## 2016-03-20 MED ORDER — POLYETHYLENE GLYCOL 3350 17 G PO PACK
17.0000 g | PACK | Freq: Two times a day (BID) | ORAL | Status: DC
  Administered 2016-03-20 – 2016-03-22 (×5): 17 g via ORAL
  Filled 2016-03-20 (×5): qty 1

## 2016-03-20 MED ORDER — FUROSEMIDE 40 MG PO TABS
40.0000 mg | ORAL_TABLET | Freq: Two times a day (BID) | ORAL | Status: DC
  Administered 2016-03-20 – 2016-03-23 (×6): 40 mg via ORAL
  Filled 2016-03-20 (×6): qty 1

## 2016-03-20 MED ORDER — WARFARIN - PHARMACIST DOSING INPATIENT: Freq: Every day | Status: DC

## 2016-03-20 MED ORDER — MAGNESIUM SULFATE 4 GM/100ML IV SOLN
4.0000 g | Freq: Once | INTRAVENOUS | Status: AC
  Administered 2016-03-20: 4 g via INTRAVENOUS
  Filled 2016-03-20: qty 100

## 2016-03-20 MED ORDER — SORBITOL 70 % SOLN: 30.0000 mL | Freq: Every day | Status: DC | PRN

## 2016-03-20 MED ORDER — OXYCODONE-ACETAMINOPHEN 5-325 MG PO TABS
1.0000 | ORAL_TABLET | ORAL | Status: DC | PRN
  Administered 2016-03-20: 1 via ORAL
  Administered 2016-03-22: 0.5 via ORAL
  Filled 2016-03-20 (×2): qty 1

## 2016-03-20 NOTE — Progress Notes (Signed)
  Progress Note    03/20/2016 12:29 PM 1 Day Post-Op  Subjective:  No complaints; says her feet feel better  Afebrile HR 80's  403'K-742'V systolic 95% RA  Vitals:   03/20/16 0500 03/20/16 0836  BP: (!) 145/96 100/61  Pulse: 80 80  Resp: 16   Temp: 97.6 F (36.4 C)     Physical Exam: Cardiac:  regular Lungs:  Non labored Incisions:   Bilateral groins are soft without hematoma Extremities:  Left foot with monophasic DP/PT/peroneal   CBC    Component Value Date/Time   WBC 7.5 03/20/2016 0503   RBC 4.48 03/20/2016 0503   HGB 9.2 (L) 03/20/2016 0503   HCT 31.2 (L) 03/20/2016 0503   PLT PENDING 03/20/2016 0503   MCV 69.6 (L) 03/20/2016 0503   MCH 20.5 (L) 03/20/2016 0503   MCHC 29.5 (L) 03/20/2016 0503   RDW 19.1 (H) 03/20/2016 0503   LYMPHSABS 1.8 03/18/2016 1132   MONOABS 1.0 03/18/2016 1132   EOSABS 0.2 03/18/2016 1132   BASOSABS 0.0 03/18/2016 1132    BMET    Component Value Date/Time   NA 138 03/20/2016 0503   K 4.2 03/20/2016 0503   CL 99 (L) 03/20/2016 0503   CO2 22 03/20/2016 0503   GLUCOSE 127 (H) 03/20/2016 0503   BUN 26 (H) 03/20/2016 0503   CREATININE 1.19 (H) 03/20/2016 0503   CALCIUM 9.1 03/20/2016 0503   GFRNONAA 43 (L) 03/20/2016 0503   GFRAA 50 (L) 03/20/2016 0503    INR    Component Value Date/Time   INR 1.70 03/20/2016 0503     Intake/Output Summary (Last 24 hours) at 03/20/16 1229 Last data filed at 03/20/16 0900  Gross per 24 hour  Intake          1669.25 ml  Output              375 ml  Net          1294.25 ml     Assessment:  76 y.o. female is s/p:  Procedure Performed:             1.  Ultrasound-guided access right femoral artery             2.  Ultrasound-guided access, left femoral artery             3.  Abdominal aortogram             4.  Left lower extremity runoff             5.  Conscious sedation (128 minutes)             6.  Stent, left common iliac artery             7.  Stent, right common iliac artery    1 Day Post-Op  Plan: -pt doing well-left foot pain improved -bilateral groins are soft without hematoma -creatinine stable -plan per Dr. Zada Girt, PA-C [facility] [phone] 03/20/2016 12:29 PM   I have independently interviewed patient and agree with PA assessment and plan above. She is feeling better following iliac artery stenting. Will get abi and vein mapping while here. Ok to restart coumadin. Will see in 1 month and will determine if needs further intervention at that time.   Kendal Ghazarian C. Donzetta Matters, MD [facility] [place] Office: [phone] Pager: [phone]

## 2016-03-20 NOTE — Progress Notes (Signed)
PROGRESS NOTE    Sarah Moss  QHU:[id number] DOB: 1940/02/07 DOA: 03/18/2016 PCP: [name], PA-C    Brief Narrative:  76 year old female with a history of HTN, DM, A-fib with tachy brady s/p pacemaker, hypothyroidism, diastolic HF, , COPD (not O2 dependent) and right BKA from an accident, PAD s/p stent in left leg (followed by vascular in [place]) presents with c/o weakness and shortness of breath. She has progressive shortness of breath that "comes and goes" and is worse with activity over the last 1 month. She denies any chest pain. She denies any palpitations or lightheadedness. She was most recently hospitalized and discharged from [facility] on 12/26/15 for CHF exacerbation. Most recent 2-D echo 10/02/15, showed low normal LV systolic function 46-56%, moderate MR and TR, moderate pulmonary hypertension, biatrial enlargement,  Patient also complains of left foot pain. Patient was recently evaluated by the vascular surgery on 03/08/16 Dr. Kathreen Cosier, [name], MD, [facility] .Exam indicates significantly abnormal PVR. Suggests superficial femoral artery occlusion. Her left ABI is non-compressible, though her toe pressure of 60 is adequate for wound healing. Her left ankle waveform is pulsatile, but slightly dampened. Her symptoms right now are not consistent with ischemia rest pain and she has no tissue loss. Compression stockings were recommended. Angiogram was recommended, she would like to hold off on it. Patient was planned to have a 3 week follow-up with her vascular surgeon. ED course BP 131/62  Pulse 79  Temp 97.6 F (36.4 C) (Oral)  Resp 20  SpO2 98% Chest x-ray shows central mild vascular congestion without pulmonary edema Hemoglobin 9.8, MCV 69.0, creatinine 1.11, BNP 765.5, troponin 0.02, INR 2.44, UA negative, Patient evaluated for shortness of breath, left foot pain.    Assessment & Plan:   Principal Problem:   CHF (congestive heart  failure) (HCC) Active Problems:   Atrial fibrillation (HCC)   Hx of right BKA (HCC)   COPD (chronic obstructive pulmonary disease) ([place])   Diabetes mellitus ([place])   Gastroesophageal reflux disease   Acute on chronic heart failure (HCC)  #1 acute on chronic CHF exacerbation Patient likely with acute on chronic diastolic heart failure presented with shortness of breath lower extremity edema. Patient with 2-D echo done on 09/30/2016. Repeat 2-D echo pending. Patient with urine output of 375 mL over the past 24 hours. Change IV Lasix to oral lasix.  Will likely need outpatient follow-up with her cardiologist.  #2 left foot pain Uric acid level elevated. ABIs with left ABI of 0.27 suggestive of critical disease at rest. Patient has been seen in consultation by vascular surgery and patient s/p aortogram with left leg angiogram and intervention 03/19/2016 per vascular surgery. ABI and vein mapping pending per vascular surgery.  #3 history of atrial fibrillation Continue Cardizem and atenolol for rate control. Coumadin on hold as being reversed for possible procedure by vascular surgery. Will defer resumption of anticoagulation to vascular surgery.  #4 COPD Stable. Continue Dulera, singular, PPI,   #5 gastroesophageal reflux disease PPI.  #6 diabetes mellitus SSI  #7 hypothyroidism Continue home dose Synthroid.  #8 hypertension Continue lisinopril, Cardizem, Lasix, atenolol.  #9 iron deficiency anemia H&H stable. Will likely need oral iron supplementation on discharge. Will give a dose of IV iron. Outpatient follow-up with GI.  #10 history of right BKA    DVT prophylaxis: Held for procedure. DVT prophylaxis per vascular surgery. Code Status: Full Family Communication: Updated patient and family at bedside. Disposition Plan: SNF when symptoms  have resolved and heart failure improved and patient on oral diuretics and per vascular surgery.   Consultants:   Vascular surgery: Dr.  Donzetta Matters 03/18/2016  Procedures:   ABIs 03/18/2016  2-D echo 03/19/2016  Chest x-ray 03/18/2016  Aortogram with left leg angiogram and possible intervention pending 03/19/2016  2 units FFP 03/19/2016  Antimicrobials:   None   Subjective: Patient more alert than this morning per family as well as more drowsy. Patient states some improvement with shortness of breath.   Objective: Vitals:   03/19/16 2208 03/20/16 0500 03/20/16 0836 03/20/16 1243  BP:  (!) 145/96 100/61   Pulse:  80 80   Resp:  16    Temp:  97.6 F (36.4 C)    TempSrc:  Oral    SpO2:  96%  96%  Weight:  69 kg (152 lb 3.2 oz)    Height: 5\' 5"  (1.651 m)       Intake/Output Summary (Last 24 hours) at 03/20/16 1309 Last data filed at 03/20/16 0900  Gross per 24 hour  Intake          1405.25 ml  Output              375 ml  Net          1030.25 ml   Filed Weights   03/18/16 1838 03/19/16 0356 03/20/16 0500  Weight: 69.8 kg (153 lb 12.8 oz) 67.4 kg (148 lb 11.2 oz) 69 kg (152 lb 3.2 oz)    Examination:  General exam: Appears calm and comfortable  Respiratory system: Clear to auscultation. Respiratory effort normal. Cardiovascular system: S1 & S2 heard, RRR. No JVD, murmurs, rubs, gallops or clicks. No pedal edema. Gastrointestinal system: Abdomen is nondistended, soft and nontender. No organomegaly or masses felt. Normal bowel sounds heard. Central nervous system: Alert and oriented. No focal neurological deficits. Extremities: s/p R BKA. LLE with trace-1+ edema, left foot less rubor, some ttp. Skin: No rashes, lesions or ulcers Psychiatry: Judgement and insight appear normal. Mood & affect appropriate.     Data Reviewed: I have personally reviewed following labs and imaging studies  CBC:  Recent Labs Lab 03/18/16 1132 03/19/16 1024 03/20/16 0503  WBC 8.0 5.7 7.5  NEUTROABS 5.0  --   --   HGB 9.8* 9.8* 9.2*  HCT 31.9* 31.8* 31.2*  MCV 69.0* 69.0* 69.6*  PLT 265 221 PENDING   Basic  Metabolic Panel:  Recent Labs Lab 03/18/16 1132 03/19/16 0807 03/19/16 1024 03/20/16 0503  NA 139  --  138 138  K 4.3  --  4.1 4.2  CL 106  --  104 99*  CO2 22  --  23 22  GLUCOSE 137*  --  123* 127*  BUN 27*  --  26* 26*  CREATININE 1.11*  --  1.15* 1.19*  CALCIUM 9.0  --  9.0 9.1  MG 1.5* 1.7  --  1.7   GFR: Estimated Creatinine Clearance: 39.2 mL/min (by C-G formula based on SCr of 1.19 mg/dL (H)). Liver Function Tests:  Recent Labs Lab 03/18/16 1132 03/19/16 1024  AST 41 27  ALT 17 15  ALKPHOS 75 70  BILITOT 1.0 1.2  PROT 7.5 6.6  ALBUMIN 3.8 3.3*   No results for input(s): LIPASE, AMYLASE in the last 168 hours. No results for input(s): AMMONIA in the last 168 hours. Coagulation Profile:  Recent Labs Lab 03/18/16 1132 03/19/16 1024 03/19/16 1307 03/20/16 0503  INR 2.44 2.18 1.71 1.70   Cardiac  Enzymes: No results for input(s): CKTOTAL, CKMB, CKMBINDEX, TROPONINI in the last 168 hours. BNP (last 3 results) No results for input(s): PROBNP in the last 8760 hours. HbA1C: No results for input(s): HGBA1C in the last 72 hours. CBG:  Recent Labs Lab 03/19/16 1155 03/19/16 1815 03/19/16 2212 03/20/16 0807 03/20/16 1146  GLUCAP 120* 117* 195* 124* 152*   Lipid Profile: No results for input(s): CHOL, HDL, LDLCALC, TRIG, CHOLHDL, LDLDIRECT in the last 72 hours. Thyroid Function Tests:  Recent Labs  03/18/16 1618  TSH 4.251  FREET4 1.40*   Anemia Panel:  Recent Labs  03/18/16 1618  VITAMINB12 307  FOLATE 22.1  FERRITIN 10*  TIBC 546*  IRON 14*  RETICCTPCT 2.1   Sepsis Labs: No results for input(s): PROCALCITON, LATICACIDVEN in the last 168 hours.  No results found for this or any previous visit (from the past 240 hour(s)).       Radiology Studies: No results found.      Scheduled Meds: . sodium chloride   Intravenous Once  . aspirin EC  81 mg Oral Daily  . atenolol  25 mg Oral Daily  . atorvastatin  40 mg Oral q1800  .  diltiazem  120 mg Oral Daily  . DULoxetine  60 mg Oral Daily  . furosemide  40 mg Intravenous Q12H  . gabapentin  300 mg Oral QHS  . insulin aspart  0-9 Units Subcutaneous TID WC  . levothyroxine  25 mcg Oral QAC breakfast  . lisinopril  20 mg Oral Daily  . mometasone-formoterol  2 puff Inhalation BID  . montelukast  10 mg Oral QHS  . pantoprazole  40 mg Oral Daily  . polyethylene glycol  17 g Oral BID  . senna-docusate  1 tablet Oral BID  . sodium chloride flush  3 mL Intravenous Q12H   Continuous Infusions: . sodium chloride 10 mL/hr at 03/19/16 2241     LOS: 2 days    Time spent: 44 minutes    THOMPSON,DANIEL, MD [facility] Pager [phone]  If 7PM-7AM, please contact night-coverage [email or web address] Password [email or web address] 03/20/2016, 1:09 PM

## 2016-03-20 NOTE — Progress Notes (Signed)
PT Cancellation Note  Patient Details Name: Sarah Moss MRN: [id number] DOB: 07-23-1940   Cancelled Treatment:    Reason Eval/Treat Not Completed: Other (comment) (pt refused but is siting bedside).. Has 3 visitors who are family who talked with pt about doing therapy but she asks to wait.  Try later as pt is willing to do treatment.   Ramond Dial 03/20/2016, 11:47 AM   Mee Hives, PT MS Acute Rehab Dept. Number: [place] and [place]

## 2016-03-20 NOTE — Progress Notes (Signed)
  Echocardiogram 2D Echocardiogram has been performed.  Sarah Moss 03/20/2016, 3:09 PM

## 2016-03-20 NOTE — Evaluation (Signed)
Physical Therapy Evaluation Patient Details Name: Sarah Moss MRN: [id number] DOB: 10-02-1940 Today's Date: 03/20/2016   History of Present Illness  76 yo female with onset of CHF after being at home with family, had femoral a sheath removed 3/6 and has B iliac stents now.  PMHx:  L foot ulcer, asthma, COPD, CHF, DM, amputee from age 57  Clinical Impression  Pt is up to walk with assistance and noted her instability which is not characteristic of PLOF.  Her plan is to follow acutely for standing and strengthening, progressing gait as able for reduction of need to stay in SNF.  Her efforts are good but is clearly unsafe in standing alone but did have acceptable O2 sats with effort, pulses were WFL.    Follow Up Recommendations SNF    Equipment Recommendations  None recommended by PT    Recommendations for Other Services       Precautions / Restrictions Precautions Precautions: Fall (telemetry) Required Braces or Orthoses: Other Brace/Splint (R BK prosthesis) Restrictions Weight Bearing Restrictions: No      Mobility  Bed Mobility Overal bed mobility: Needs Assistance Bed Mobility: Supine to Sit;Sit to Supine     Supine to sit: Min guard Sit to supine: Min guard   General bed mobility comments: cued for sequence and safety wiht lines esp IV  Transfers Overall transfer level: Needs assistance Equipment used: 1 person hand held assist (IV pole) Transfers: Sit to/from [facility] Sit to Stand: Min assist Stand pivot transfers: Min guard;Min assist       General transfer comment: pt needed prompting for hand placement and cues for seuqence and safety with transition to stand, but is very motivated to work  Ambulation/Gait Ambulation/Gait assistance: Museum/gallery curator (Feet): 40 Feet Assistive device: 1 person hand held assist (IV pole) Gait Pattern/deviations: Step-to pattern;Step-through pattern;Decreased stride length;Wide base of  support;Trunk flexed Gait velocity: reduced Gait velocity interpretation: Below normal speed for age/gender General Gait Details: prompted upright posture and safety with navigating with IV pole  Stairs            Wheelchair Mobility    Modified Rankin (Stroke Patients Only)       Balance                                             Pertinent Vitals/Pain Pain Assessment: No/denies pain    Home Living Family/patient expects to be discharged to:: Private residence Living Arrangements: Other relatives (sister ) Available Help at Discharge: Family;Available 24 hours/day Type of Home: House Home Access: Stairs to enter Entrance Stairs-Rails: None Entrance Stairs-Number of Steps: 1 Home Layout: One level Home Equipment: Walker - 4 wheels (R BK prosthesis)      Prior Function Level of Independence: Independent               Hand Dominance        Extremity/Trunk Assessment   Upper Extremity Assessment Upper Extremity Assessment: Generalized weakness    Lower Extremity Assessment Lower Extremity Assessment: Generalized weakness    Cervical / Trunk Assessment Cervical / Trunk Assessment: Kyphotic  Communication   Communication: No difficulties  Cognition Arousal/Alertness: Lethargic Behavior During Therapy: Flat affect Overall Cognitive Status: Impaired/Different from baseline Area of Impairment: Safety/judgement;Awareness;Problem solving;Following commands       Following Commands: Follows one step commands with increased time Safety/Judgement: Decreased  awareness of safety;Decreased awareness of deficits Awareness: Intellectual Problem Solving: Slow processing;Decreased initiation;Difficulty sequencing;Requires verbal cues;Requires tactile cues General Comments: cued for prosthetic application and could do the steps with PT cueing    General Comments      Exercises     Assessment/Plan    PT Assessment Patient needs  continued PT services  PT Problem List Decreased strength;Decreased range of motion;Decreased activity tolerance;Decreased balance;Decreased mobility;Decreased coordination;Decreased safety awareness;Cardiopulmonary status limiting activity;Decreased skin integrity       PT Treatment Interventions DME instruction;Gait training;Stair training;Functional mobility training;Therapeutic activities;Balance training;Neuromuscular re-education;Therapeutic exercise;Patient/family education    PT Goals (Current goals can be found in the Care Plan section)  Acute Rehab PT Goals Patient Stated Goal: to walk a little PT Goal Formulation: With patient/family Time For Goal Achievement: 04/03/16 Potential to Achieve Goals: Good    Frequency Min 3X/week   Barriers to discharge Inaccessible home environment;Decreased caregiver support (has one person assist at home)      Co-evaluation               End of Session Equipment Utilized During Treatment: Gait belt Activity Tolerance: Patient tolerated treatment well;Patient limited by fatigue;Patient limited by lethargy Patient left: in bed;with call bell/phone within reach;with nursing/sitter in room;Other (comment) (has echocardiogram tech transport arrived) Nurse Communication: Mobility status PT Visit Diagnosis: Unsteadiness on feet (R26.81);Muscle weakness (generalized) (M62.81)    Functional Assessment Tool Used: AM-PAC 6 Clicks Basic Mobility    Time: 1341-1413 PT Time Calculation (min) (ACUTE ONLY): 32 min   Charges:   PT Evaluation $PT Eval Moderate Complexity: 1 Procedure PT Treatments $Gait Training: 8-22 mins   PT G Codes:   PT G-Codes **NOT FOR INPATIENT CLASS** Functional Assessment Tool Used: AM-PAC 6 Clicks Basic Mobility     Ramond Dial 03/20/2016, 3:05 PM   Mee Hives, PT MS Acute Rehab Dept. Number: [place] and [place]

## 2016-03-20 NOTE — Progress Notes (Signed)
VASCULAR LAB PRELIMINARY  ARTERIAL  ABI completed: Non-compressible left ABI with monophasic waveforms.    RIGHT    LEFT    PRESSURE WAVEFORM  PRESSURE WAVEFORM  BRACHIAL 97 Bi BRACHIAL 131 Tri  DP   DP    AT N/A BKA  AT 134 Mono  PT   PT 255 Mono  PER   PER    GREAT TOE  NA GREAT TOE  NA    RIGHT LEFT  ABI N/A Non compressible    Landry Mellow, RDMS, RVT  03/20/2016, 3:39 PM

## 2016-03-20 NOTE — Progress Notes (Signed)
Patient was previously followed by [facility] in [place].  The family has requested transfer of her care to a local cardiology group [facility]).  Order placed for HF referral at [facility] by physician.  Family does not want referral to [facility] HF services.  MD Please indicate if you wish for patient to be seen inpatient or have follow-up at [facility].

## 2016-03-20 NOTE — Progress Notes (Signed)
Lower Extremity Vein Map    Right Great Saphenous Vein   Segment Diameter Comment  1. Origin 6.15mm   2. High Thigh 3.54mm   3. Mid Thigh 3.33mm   4. Low Thigh 2.67mm branch  5. At Knee 2.47mm branch  6. High Calf mm BKA  7. Low Calf mm   8. Ankle mm    mm    mm    mm     Left Great Saphenous Vein  Segment Diameter Comment  1. Origin 5.96mm   2. High Thigh 3.64mm   3. Mid Thigh 3.65mm   4. Low Thigh 3.64mm branch  5. At Knee 2.45mm   6. High Calf 2.55mm branch  7. Low Calf 2.46mm   8. Ankle 2.60mm     Landry Mellow, RDMS, RVT  03/20/2016

## 2016-03-20 NOTE — Progress Notes (Addendum)
[facility] for Warfarin Indication: atrial fibrillation  Allergies  Allergen Reactions  . Codeine Itching and Nausea And Vomiting  . Ambien  [Zolpidem]     Other reaction(s): Confusion Makes me crazy     Patient Measurements: Height: 5\' 5"  (165.1 cm) Weight: 152 lb 3.2 oz (69 kg) IBW/kg (Calculated) : 57  Height: 5'5" Weight (as of 03/05/2016): 64.4 kg   Vital Signs: Temp: 97.6 F (36.4 C) (03/07 0500) Temp Source: Oral (03/07 0500) BP: 100/61 (03/07 0836) Pulse Rate: 80 (03/07 0836)  Labs:  Recent Labs  03/18/16 1132 03/19/16 1024 03/19/16 1307 03/20/16 0503  HGB 9.8* 9.8*  --  9.2*  HCT 31.9* 31.8*  --  31.2*  PLT 265 221  --  PENDING  LABPROT 26.9* 24.6* 20.3* 20.2*  INR 2.44 2.18 1.71 1.70  CREATININE 1.11* 1.15*  --  1.19*    Estimated Creatinine Clearance: 39.2 mL/min (by C-G formula based on SCr of 1.19 mg/dL (H)).   Medical History: Past Medical History:  Diagnosis Date  . Asthma   . CHF (congestive heart failure) ([place])   . COPD (chronic obstructive pulmonary disease) ([place])   . Diabetes mellitus without complication ([place])   . GERD (gastroesophageal reflux disease)   . Hx of right BKA ([place])   . Hypertension      Assessment: 75 y/oF on chronic warfarin therapy for atrial fibrillation who presents to [facility] ED on 03/18/2016 with weakness, shortness of breath, and left foot pain. Pharmacy consulted to manage warfarin dosing while patient in the hospital.  S/p angiogram with stent to left and right common iliac artery.  No warfarin given last night, 2 units of FFP given. INR 1.7 this morning.   Home warfarin dose reported as 4mg  alternating with 6mg  daily, with last dose of 4mg  taken on 03/17/2016.   Warfarin consult is pre-op will follow up resumption of warfarin per vascular surgery recommendations.  Goal of Therapy:  INR 2-3 Monitor platelets by anticoagulation protocol: Yes  Erin Hearing PharmD.,  BCPS Clinical Pharmacist Pager [phone] 03/20/2016 11:37 AM  Addendum:  Madaline Brilliant per [facility] to restart warfarin. Will give extra 7.5mg  tonight as she has not had coumadin for the past two days and FFP given preop.  03/20/2016 2:50 PM

## 2016-03-21 ENCOUNTER — Telehealth: Payer: Self-pay | Admitting: Vascular Surgery

## 2016-03-21 DIAGNOSIS — I429 Cardiomyopathy, unspecified: Secondary | ICD-10-CM

## 2016-03-21 LAB — BASIC METABOLIC PANEL
Anion gap: 12 (ref 5–15)
BUN: 22 mg/dL — ABNORMAL HIGH (ref 6–20)
CHLORIDE: 103 mmol/L (ref 101–111)
CO2: 23 mmol/L (ref 22–32)
CREATININE: 1.13 mg/dL — AB (ref 0.44–1.00)
Calcium: 8.6 mg/dL — ABNORMAL LOW (ref 8.9–10.3)
GFR calc non Af Amer: 46 mL/min — ABNORMAL LOW (ref >60)
GFR, EST AFRICAN AMERICAN: 53 mL/min — AB (ref >60)
Glucose, Bld: 119 mg/dL — ABNORMAL HIGH (ref 65–99)
Potassium: 3.8 mmol/L (ref 3.5–5.1)
Sodium: 138 mmol/L (ref 135–145)

## 2016-03-21 LAB — PROTIME-INR
INR: 1.66
PROTHROMBIN TIME: 19.8 s — AB (ref 11.4–15.2)

## 2016-03-21 LAB — CBC
HCT: 29.4 % — ABNORMAL LOW (ref 36.0–46.0)
Hemoglobin: 8.8 g/dL — ABNORMAL LOW (ref 12.0–15.0)
MCH: 20.8 pg — AB (ref 26.0–34.0)
MCHC: 29.9 g/dL — AB (ref 30.0–36.0)
MCV: 69.5 fL — ABNORMAL LOW (ref 78.0–100.0)
PLATELETS: 196 10*3/uL (ref 150–400)
RBC: 4.23 MIL/uL (ref 3.87–5.11)
RDW: 19.1 % — AB (ref 11.5–15.5)
WBC: 7.6 10*3/uL (ref 4.0–10.5)

## 2016-03-21 LAB — MAGNESIUM: Magnesium: 2.1 mg/dL (ref 1.7–2.4)

## 2016-03-21 LAB — GLUCOSE, CAPILLARY
GLUCOSE-CAPILLARY: 241 mg/dL — AB (ref 65–99)
GLUCOSE-CAPILLARY: 137 mg/dL — AB (ref 65–99)
Glucose-Capillary: 132 mg/dL — ABNORMAL HIGH (ref 65–99)
Glucose-Capillary: 219 mg/dL — ABNORMAL HIGH (ref 65–99)

## 2016-03-21 MED ORDER — WARFARIN SODIUM 7.5 MG PO TABS
7.5000 mg | ORAL_TABLET | Freq: Once | ORAL | Status: AC
  Administered 2016-03-21: 7.5 mg via ORAL
  Filled 2016-03-21: qty 1

## 2016-03-21 MED ORDER — SODIUM CHLORIDE 0.9 % IV SOLN
250.0000 mg | Freq: Once | INTRAVENOUS | Status: AC
  Administered 2016-03-21: 250 mg via INTRAVENOUS
  Filled 2016-03-21: qty 20

## 2016-03-21 NOTE — Progress Notes (Signed)
PROGRESS NOTE    Sarah Moss  UDJ:[id number] DOB: 1941/01/09 DOA: 03/18/2016 PCP: [name], PA-C    Brief Narrative:  76 year old female with a history of HTN, DM, A-fib with tachy brady s/p pacemaker, hypothyroidism, diastolic HF, , COPD (not O2 dependent) and right BKA from an accident, PAD s/p stent in left leg (followed by vascular in [place]) presents with c/o weakness and shortness of breath. She has progressive shortness of breath that "comes and goes" and is worse with activity over the last 1 month. She denies any chest pain. She denies any palpitations or lightheadedness. She was most recently hospitalized and discharged from [facility] on 12/26/15 for CHF exacerbation. Most recent 2-D echo 10/02/15, showed low normal LV systolic function 58-85%, moderate MR and TR, moderate pulmonary hypertension, biatrial enlargement,  Patient also complains of left foot pain. Patient was recently evaluated by the vascular surgery on 03/08/16 Dr. Kathreen Cosier, [name], MD, [facility] .Exam indicates significantly abnormal PVR. Suggests superficial femoral artery occlusion. Her left ABI is non-compressible, though her toe pressure of 60 is adequate for wound healing. Her left ankle waveform is pulsatile, but slightly dampened. Her symptoms right now are not consistent with ischemia rest pain and she has no tissue loss. Compression stockings were recommended. Angiogram was recommended, she would like to hold off on it. Patient was planned to have a 3 week follow-up with her vascular surgeon. ED course BP 131/62  Pulse 79  Temp 97.6 F (36.4 C) (Oral)  Resp 20  SpO2 98% Chest x-ray shows central mild vascular congestion without pulmonary edema Hemoglobin 9.8, MCV 69.0, creatinine 1.11, BNP 765.5, troponin 0.02, INR 2.44, UA negative, Patient evaluated for shortness of breath, left foot pain.    Assessment & Plan:   Principal Problem:   CHF (congestive heart  failure) (HCC) Active Problems:   Atrial fibrillation (HCC)   Hx of right BKA (HCC)   COPD (chronic obstructive pulmonary disease) ([place])   Diabetes mellitus ([place])   Gastroesophageal reflux disease   Acute on chronic heart failure (HCC)   PAD (peripheral artery disease) ([place])  #1 acute on chronic CHF exacerbation Patient likely with acute on chronic diastolic heart failure presented with shortness of breath lower extremity edema. Significant clinical improvement. Patient likely currently euvolemic. Patient with 2-D echo done on 09/30/2016. Repeat 2-D echo with EF of 20-25% with diffuse hypokinesis. Patient with urine output of 850 mL over the past 24 hours. Changed IV Lasix to oral lasix.  Patient with a severely reduced EF of 20-25% with diffuse hypokinesis. Continue aspirin, beta blocker, statin, Cardizem, Lasix, lisinopril. Will consult with cardiology in the morning. Will likely need outpatient follow-up with her cardiologist.  #2 left foot pain Uric acid level elevated. ABIs with left ABI of 0.27 suggestive of critical disease at rest. Patient has been seen in consultation by vascular surgery and patient s/p aortogram with left leg angiogram and intervention(bilateral iliac artery stenting) 03/19/2016 per vascular surgery. Pending left foot improved. ABIs on the left noncompressible. Continue aspirin and statin. Patient's Coumadin has been resumed. Patient will need to follow-up with vascular surgery in 4 weeks post discharge. Vascular following and appreciate input and recommendation.   #3 history of atrial fibrillation Continue Cardizem and atenolol for rate control. Coumadin has been resumed. Outpatient follow-up.   #4 COPD Stable. Continue Dulera, singular, PPI,   #5 gastroesophageal reflux disease PPI.  #6 diabetes mellitus SSI  #7 hypothyroidism Continue home dose Synthroid.  #8  hypertension Continue lisinopril, Cardizem, Lasix, atenolol.  #9 iron deficiency anemia H&H  stable. Will give a dose of IV iron today. Will likely need oral iron supplementation on discharge. Outpatient follow-up with GI.  #10 history of right BKA    DVT prophylaxis: Coumadin has been resumed.  Code Status: Full Family Communication: Updated patient. No family at bedside. Disposition Plan: SNF when symptoms have resolved and heart failure improved and patient on oral diuretics and per vascular surgery hopefully tomorrow..   Consultants:   Vascular surgery: Dr. Donzetta Matters 03/18/2016  Procedures:   ABIs 03/18/2016  2-D echo 03/07 discharge sixth/2018  Chest x-ray 03/18/2016  Aortogram with left leg angiogram and possible intervention pending 03/19/2016  2 units FFP 03/19/2016  Repeat ABIs 03/20/2016  Vein mapping 03/20/2016  Antimicrobials:   None   Subjective: Patient alert. Patient states left foot pain improved. Shortness of breath improved.   Objective: Vitals:   03/21/16 0721 03/21/16 0821 03/21/16 0914 03/21/16 1423  BP:   124/69 (!) 136/58  Pulse:   80 80  Resp:    18  Temp:    100 F (37.8 C)  TempSrc:    Oral  SpO2: 96% 99%  95%  Weight:      Height:        Intake/Output Summary (Last 24 hours) at 03/21/16 1858 Last data filed at 03/21/16 1811  Gross per 24 hour  Intake             1335 ml  Output              300 ml  Net             1035 ml   Filed Weights   03/19/16 0356 03/20/16 0500 03/21/16 0500  Weight: 67.4 kg (148 lb 11.2 oz) 69 kg (152 lb 3.2 oz) 70.9 kg (156 lb 3.2 oz)    Examination:  General exam: Appears calm and comfortable  Respiratory system: Clear to auscultation. Respiratory effort normal. Cardiovascular system: S1 & S2 heard, RRR. No JVD, murmurs, rubs, gallops or clicks. No pedal edema. Gastrointestinal system: Abdomen is nondistended, soft and nontender. No organomegaly or masses felt. Normal bowel sounds heard. Central nervous system: Alert and oriented. No focal neurological deficits. Extremities: s/p R BKA. LLE  with trace edema, left foot less rubor, less ttp. Skin: No rashes, lesions or ulcers Psychiatry: Judgement and insight appear normal. Mood & affect appropriate.     Data Reviewed: I have personally reviewed following labs and imaging studies  CBC:  Recent Labs Lab 03/18/16 1132 03/19/16 1024 03/20/16 0503 03/21/16 0355  WBC 8.0 5.7 7.5 7.6  NEUTROABS 5.0  --   --   --   HGB 9.8* 9.8* 9.2* 8.8*  HCT 31.9* 31.8* 31.2* 29.4*  MCV 69.0* 69.0* 69.6* 69.5*  PLT 265 221 235 646   Basic Metabolic Panel:  Recent Labs Lab 03/18/16 1132 03/19/16 0807 03/19/16 1024 03/20/16 0503 03/21/16 0355  NA 139  --  138 138 138  K 4.3  --  4.1 4.2 3.8  CL 106  --  104 99* 103  CO2 22  --  23 22 23   GLUCOSE 137*  --  123* 127* 119*  BUN 27*  --  26* 26* 22*  CREATININE 1.11*  --  1.15* 1.19* 1.13*  CALCIUM 9.0  --  9.0 9.1 8.6*  MG 1.5* 1.7  --  1.7 2.1   GFR: Estimated Creatinine Clearance: 41.9 mL/min (by C-G formula based  on SCr of 1.13 mg/dL (H)). Liver Function Tests:  Recent Labs Lab 03/18/16 1132 03/19/16 1024  AST 41 27  ALT 17 15  ALKPHOS 75 70  BILITOT 1.0 1.2  PROT 7.5 6.6  ALBUMIN 3.8 3.3*   No results for input(s): LIPASE, AMYLASE in the last 168 hours. No results for input(s): AMMONIA in the last 168 hours. Coagulation Profile:  Recent Labs Lab 03/18/16 1132 03/19/16 1024 03/19/16 1307 03/20/16 0503 03/21/16 0355  INR 2.44 2.18 1.71 1.70 1.66   Cardiac Enzymes: No results for input(s): CKTOTAL, CKMB, CKMBINDEX, TROPONINI in the last 168 hours. BNP (last 3 results) No results for input(s): PROBNP in the last 8760 hours. HbA1C: No results for input(s): HGBA1C in the last 72 hours. CBG:  Recent Labs Lab 03/20/16 1617 03/20/16 2126 03/21/16 0741 03/21/16 1141 03/21/16 1631  GLUCAP 132* 169* 132* 241* 219*   Lipid Profile: No results for input(s): CHOL, HDL, LDLCALC, TRIG, CHOLHDL, LDLDIRECT in the last 72 hours. Thyroid Function Tests: No  results for input(s): TSH, T4TOTAL, FREET4, T3FREE, THYROIDAB in the last 72 hours. Anemia Panel: No results for input(s): VITAMINB12, FOLATE, FERRITIN, TIBC, IRON, RETICCTPCT in the last 72 hours. Sepsis Labs: No results for input(s): PROCALCITON, LATICACIDVEN in the last 168 hours.  No results found for this or any previous visit (from the past 240 hour(s)).       Radiology Studies: No results found.      Scheduled Meds: . sodium chloride   Intravenous Once  . aspirin EC  81 mg Oral Daily  . atenolol  25 mg Oral Daily  . atorvastatin  40 mg Oral q1800  . diltiazem  120 mg Oral Daily  . DULoxetine  60 mg Oral Daily  . furosemide  40 mg Oral BID  . gabapentin  300 mg Oral QHS  . insulin aspart  0-9 Units Subcutaneous TID WC  . levothyroxine  25 mcg Oral QAC breakfast  . lisinopril  20 mg Oral Daily  . mometasone-formoterol  2 puff Inhalation BID  . montelukast  10 mg Oral QHS  . pantoprazole  40 mg Oral Daily  . polyethylene glycol  17 g Oral BID  . senna-docusate  1 tablet Oral BID  . sodium chloride flush  3 mL Intravenous Q12H  . Warfarin - Pharmacist Dosing Inpatient   Does not apply q1800   Continuous Infusions: . sodium chloride 10 mL/hr at 03/19/16 2241     LOS: 3 days    Time spent: 21 minutes    THOMPSON,DANIEL, MD [facility] Pager [phone]  If 7PM-7AM, please contact night-coverage [email or web address] Password [facility] 03/21/2016, 6:58 PM

## 2016-03-21 NOTE — Telephone Encounter (Signed)
Spoke to sister, opted for letter to be mailed, verified address and mailed lttr for appt on 4/6

## 2016-03-21 NOTE — Plan of Care (Signed)
Problem: Pain Managment: Goal: General experience of comfort will improve Outcome: Progressing Pt's pain is improving no pain meds given this shift.  Problem: Physical Regulation: Goal: Ability to maintain clinical measurements within normal limits will improve Outcome: Progressing Pt is more alert today & has been getting up to ambulate to the bathroom.  Problem: Activity: Goal: Risk for activity intolerance will decrease Outcome: Progressing Pt is still getting sob upon ambulation at times.

## 2016-03-21 NOTE — Progress Notes (Signed)
[facility] [place]  Subjective  - Left groin pain better with percocet q 4 PRN.   Objective 122/67 80 97.5 F (36.4 C) (Oral) 18 96%  Intake/Output Summary (Last 24 hours) at 03/21/16 0819 Last data filed at 03/21/16 0600  Gross per 24 hour  Intake              696 ml  Output              600 ml  Net               96 ml   ABI 03/20/2016 Arterial pressure indices:  +----------------+---------+--------------+----------+ !Location        !Pressure !Brachial index!Waveform  ! +----------------+---------+--------------+----------+ !Left ant tibial !134 mm Hg!1.02          !Monophasic! +----------------+---------+--------------+----------+ !Left post tibial!255 mm Hg!1.95          !Monophasic! +----------------+---------+--------------+----------+   Vein mapping 03/20/2016  +----+-------------------+--------+------------+------------------+ !Vein!Location           !Diameter!Observations!Comments          ! +----+-------------------+--------+------------+------------------+ !GSV !Origin             !6.22 mm !------------!Patent            ! !    !                   !        !            !Compressible      ! +----+-------------------+--------+------------+------------------+ !GSV !Proximal right     !3.26 mm !------------!Patent            ! !    !thigh              !        !            !Compressible      ! +----+-------------------+--------+------------+------------------+ !GSV !Mid right thigh    !3.58 mm !------------!Patent            ! !    !                   !        !            !Compressible      ! +----+-------------------+--------+------------+------------------+ !GSV !Distal right thigh !2.96 mm !Branch      !Patent            ! !    !                   !        !            !Compressible      ! +----+-------------------+--------+------------+------------------+ !GSV !Right knee         !2.73 mm !Branch      !Patent            ! !    !                    !        !            !Compressible      ! +----+-------------------+--------+------------+------------------+ !GSV !Origin             !5.08 mm !------------!Patent            ! !    !                   !        !            !  Compressible      ! +----+-------------------+--------+------------+------------------+ !GSV !Proximal left thigh!3.95 mm !------------!Patent            ! !    !                   !        !            !Compressible      ! +----+-------------------+--------+------------+------------------+ !GSV !Mid left thigh     !3.71 mm !------------!Patent            ! !    !                   !        !            !Compressible      ! +----+-------------------+--------+------------+------------------+ !GSV !Distal left thigh  !3.79 mm !Branch      !Patent            ! !    !                   !        !            !Compressible      ! +----+-------------------+--------+------------+------------------+ !GSV !Left knee          !2.7 mm  !------------!Patent            ! !    !                   !        !            !Compressible      ! +----+-------------------+--------+------------+------------------+ !GSV !Proximal left calf !2.53 mm !Branch      !Patent            ! !    !                   !        !            !Compressible      ! +----+-------------------+--------+------------+------------------+ !GSV !Mid left calf      !2.6 mm  !------------!Patent            ! !    !                   !        !            !Compressible      ! +----+-------------------+--------+------------+------------------+ !GSV !Left ankle         !2.67 mm !------------!Patent            ! !    !                   !        !            !Compressible      ! +----+-------------------+--------+------------+------------------+  ------------------------------------------------------------------- Summary:  Left groin soft, tender to palpation, no erythema or drainage Right groin soft  NTTP Doppler PT/DP/peroneal signals Right amputation   Assessment/Planning:  Procedure Performed: 1. Ultrasound-guided access right femoral artery 2. Ultrasound-guided access, left femoral artery 3. Abdominal aortogram 4. Left lower extremity runoff 5. Conscious sedation (128 minutes) 6. Stent, left common iliac artery 7. Stent, right  common iliac artery  2 Day Post-Op Stent patent Good doppler flow PT/DP/Peroneal Started PO percocet 5/325 1 q 4 PRN yesterday seems to help with left groin pain.   Per Dr. Claretha Cooper note yesterday "Will get abi and vein mapping while here. Ok to restart coumadin. Will see in 1 month and will determine if needs further intervention at that time. "  Laurence Slate [facility] 03/21/2016 8:19 AM --  Laboratory Lab Results:  Recent Labs  03/20/16 0503 03/21/16 0355  WBC 7.5 7.6  HGB 9.2* 8.8*  HCT 31.2* 29.4*  PLT 235 196   BMET  Recent Labs  03/20/16 0503 03/21/16 0355  NA 138 138  K 4.2 3.8  CL 99* 103  CO2 22 23  GLUCOSE 127* 119*  BUN 26* 22*  CREATININE 1.19* 1.13*  CALCIUM 9.1 8.6*    COAG Lab Results  Component Value Date   INR 1.66 03/21/2016   INR 1.70 03/20/2016   INR 1.71 03/19/2016   No results found for: PTT

## 2016-03-21 NOTE — Progress Notes (Signed)
[place] for Warfarin Indication: atrial fibrillation  Allergies  Allergen Reactions  . Codeine Itching and Nausea And Vomiting  . Ativan [Lorazepam] Other (See Comments)    "Makes me confused and crazy"  . Ambien [Zolpidem] Other (See Comments)    "Has the opposite effect; makes me more alert/hyper"    Patient Measurements: Height: 5\' 5"  (165.1 cm) Weight: 156 lb 3.2 oz (70.9 kg) IBW/kg (Calculated) : 57  Height: 5'5" Weight (as of 03/05/2016): 64.4 kg   Vital Signs: Temp: 97.5 F (36.4 C) (03/08 0400) Temp Source: Oral (03/08 0400) BP: 122/67 (03/08 0400) Pulse Rate: 80 (03/08 0400)  Labs:  Recent Labs  03/19/16 1024 03/19/16 1307 03/20/16 0503 03/21/16 0355  HGB 9.8*  --  9.2* 8.8*  HCT 31.8*  --  31.2* 29.4*  PLT 221  --  235 196  LABPROT 24.6* 20.3* 20.2* 19.8*  INR 2.18 1.71 1.70 1.66  CREATININE 1.15*  --  1.19* 1.13*    Estimated Creatinine Clearance: 41.9 mL/min (by C-G formula based on SCr of 1.13 mg/dL (H)).   Medical History: Past Medical History:  Diagnosis Date  . Asthma   . CHF (congestive heart failure) ([place])   . COPD (chronic obstructive pulmonary disease) ([place])   . Diabetes mellitus without complication ([place])   . GERD (gastroesophageal reflux disease)   . Hx of right BKA ([place])   . Hypertension      Assessment: 75 y/oF on chronic warfarin therapy for atrial fibrillation who presents to [facility] ED on 03/18/2016 with weakness, shortness of breath, and left foot pain. Pharmacy consulted to manage warfarin dosing while patient in the hospital.  S/p angiogram with stent to left and right common iliac artery on 3/6.  Warfarin resumed 3/7, INR continues to trend down to 1.66. Will give boosted dose again today but cautious not to overshoot INR. Hgb down slightly this morning to 8.8, no bleeding issues noted overnight.   Home warfarin dose reported as 4mg  alternating with 6mg  daily, with last dose of 4mg  taken  on 03/17/2016.  Goal of Therapy:  INR 2-3 Monitor platelets by anticoagulation protocol: Yes   Plan Warfarin 7.5mg  tonight Daily INR for now  Erin Hearing PharmD., BCPS Clinical Pharmacist Pager [phone] 03/21/2016 8:36 AM

## 2016-03-21 NOTE — Progress Notes (Signed)
  Progress Note    03/21/2016 9:51 AM 2 Days Post-Op  Subjective:  Left pain is much improved  Vitals:   03/21/16 0400 03/21/16 0914  BP: 122/67 124/69  Pulse: 80 80  Resp:    Temp: 97.5 F (36.4 C)     Physical Exam: aaox3 Groin sites are soft Left foot is warm and sensorimotor in tact  CBC    Component Value Date/Time   WBC 7.6 03/21/2016 0355   RBC 4.23 03/21/2016 0355   HGB 8.8 (L) 03/21/2016 0355   HCT 29.4 (L) 03/21/2016 0355   PLT 196 03/21/2016 0355   MCV 69.5 (L) 03/21/2016 0355   MCH 20.8 (L) 03/21/2016 0355   MCHC 29.9 (L) 03/21/2016 0355   RDW 19.1 (H) 03/21/2016 0355   LYMPHSABS 1.8 03/18/2016 1132   MONOABS 1.0 03/18/2016 1132   EOSABS 0.2 03/18/2016 1132   BASOSABS 0.0 03/18/2016 1132    BMET    Component Value Date/Time   NA 138 03/21/2016 0355   K 3.8 03/21/2016 0355   CL 103 03/21/2016 0355   CO2 23 03/21/2016 0355   GLUCOSE 119 (H) 03/21/2016 0355   BUN 22 (H) 03/21/2016 0355   CREATININE 1.13 (H) 03/21/2016 0355   CALCIUM 8.6 (L) 03/21/2016 0355   GFRNONAA 46 (L) 03/21/2016 0355   GFRAA 53 (L) 03/21/2016 0355    INR    Component Value Date/Time   INR 1.66 03/21/2016 0355     Intake/Output Summary (Last 24 hours) at 03/21/16 0951 Last data filed at 03/21/16 0600  Gross per 24 hour  Intake              240 ml  Output              600 ml  Net             -360 ml     Assessment:  76 y.o. female is s/p bilateral iliac artery stenting for left foot rest pain, has right aka. Rest pain resolved with non-compressible abi's on left. Has vein for possible bypass  Plan: Needs asa and statin Ok for anticoagulation and discharge from vascular standpoint F/u in 4 weeks for further evaluation without studies, if pain persists would need left fem-pop bypass   Sam Wunschel C. Donzetta Matters, MD [facility] [place] Office: [phone] Pager: [phone]  03/21/2016 9:51 AM

## 2016-03-21 NOTE — Telephone Encounter (Signed)
-----   Message from Mena Goes, RN sent at 03/20/2016  2:58 PM EST ----- Regarding: schedule 1 month w/ Donzetta Matters   ----- Message ----- From: Waynetta Sandy, MD Sent: 03/20/2016   2:08 PM To: Vvs Charge Pool  F/u in 1 month with me   brandon

## 2016-03-21 NOTE — NC FL2 (Signed)
[place] LEVEL OF CARE SCREENING TOOL     IDENTIFICATION  Patient Name: Sarah Moss Birthdate: 04-02-1940 Sex: female Admission Date (Current Location): 03/18/2016  [facility] and [place] Number:  Herbalist and Address:  [facility] [place], [place] [address], [place], [place] [address]      Provider Number: [id number]  Attending Physician Name and Address:  Eugenie Filler, MD  Relative Name and Phone Number:       Current Level of Care: Hospital Recommended Level of Care: [place] Prior Approval Number:    Date Approved/Denied:   PASRR Number: [id number]  Discharge Plan: SNF    Current Diagnoses: Patient Active Problem List   Diagnosis Date Noted  . PAD (peripheral artery disease) (Sarah Moss)   . Atrial fibrillation (Sarah Moss) 03/18/2016  . Hx of right BKA (Sarah Moss) 03/18/2016  . CHF (congestive heart failure) (Sarah Moss) 03/18/2016  . COPD (chronic obstructive pulmonary disease) (Sarah Moss) 03/18/2016  . Diabetes mellitus (Sarah Moss) 03/18/2016  . Gastroesophageal reflux disease 03/18/2016  . Acute on chronic heart failure (Sarah Moss) 03/18/2016    Orientation RESPIRATION BLADDER Height & Weight     Self, Place, Situation  Normal Continent Weight: 156 lb 3.2 oz (70.9 kg) Height:  5\' 5"  (165.1 cm)  BEHAVIORAL SYMPTOMS/MOOD NEUROLOGICAL BOWEL NUTRITION STATUS      Continent  (Please see discharge summary)  AMBULATORY STATUS COMMUNICATION OF NEEDS Skin   Limited Assist Verbally Surgical wounds (Open wound on mid chest, adhesive bandage)                       Personal Care Assistance Level of Assistance  Bathing, Feeding, Dressing Bathing Assistance: Limited assistance Feeding assistance: Independent Dressing Assistance: Limited assistance     Functional Limitations Info  Sight, Hearing, Speech Sight Info: Adequate Hearing Info: Adequate Speech Info: Adequate    SPECIAL CARE FACTORS FREQUENCY  PT (By licensed PT), OT (By  licensed OT)     PT Frequency: 3x week OT Frequency: 3x week            Contractures Contractures Info: Not present    Additional Factors Info  Code Status, Allergies Code Status Info: Full Code Allergies Info: Codeine, Ativan Lorazepam, Ambien Zolpidem           Current Medications (03/21/2016):  This is the current hospital active medication list Current Facility-Administered Medications  Medication Dose Route Frequency Provider Last Rate Last Dose  . 0.9 %  sodium chloride infusion  250 mL Intravenous PRN Reyne Dumas, MD      . 0.9 %  sodium chloride infusion   Intravenous Once Serafina Mitchell, MD      . 0.9 %  sodium chloride infusion   Intravenous Continuous Serafina Mitchell, MD 10 mL/hr at 03/19/16 2241    . 0.9 %  sodium chloride infusion  500 mL Intravenous Once PRN Serafina Mitchell, MD      . acetaminophen (TYLENOL) tablet 325-650 mg  325-650 mg Oral Q4H PRN Serafina Mitchell, MD   650 mg at 03/20/16 1604   Or  . acetaminophen (TYLENOL) suppository 325-650 mg  325-650 mg Rectal Q4H PRN Serafina Mitchell, MD      . albuterol (PROVENTIL) (2.5 MG/3ML) 0.083% nebulizer solution 3 mL  3 mL Inhalation Q6H PRN Reyne Dumas, MD   3 mL at 03/20/16 2152  . ALPRAZolam (XANAX) tablet 0.25 mg  0.25 mg Oral Q12H PRN Reyne Dumas,  MD   0.25 mg at 03/19/16 2352  . alum & mag hydroxide-simeth (MAALOX/MYLANTA) 200-200-20 MG/5ML suspension 15-30 mL  15-30 mL Oral Q2H PRN Serafina Mitchell, MD      . aspirin EC tablet 81 mg  81 mg Oral Daily Reyne Dumas, MD   81 mg at 03/21/16 0915  . atenolol (TENORMIN) tablet 25 mg  25 mg Oral Daily Reyne Dumas, MD   25 mg at 03/21/16 0914  . atorvastatin (LIPITOR) tablet 40 mg  40 mg Oral q1800 Reyne Dumas, MD   40 mg at 03/20/16 1757  . diltiazem (CARDIZEM CD) 24 hr capsule 120 mg  120 mg Oral Daily Reyne Dumas, MD   120 mg at 03/21/16 0914  . DULoxetine (CYMBALTA) DR capsule 60 mg  60 mg Oral Daily Reyne Dumas, MD   60 mg at 03/21/16 0915  . furosemide  (LASIX) tablet 40 mg  40 mg Oral BID Eugenie Filler, MD   40 mg at 03/21/16 0727  . gabapentin (NEURONTIN) capsule 300 mg  300 mg Oral QHS Reyne Dumas, MD   300 mg at 03/20/16 2129  . guaiFENesin-dextromethorphan (ROBITUSSIN DM) 100-10 MG/5ML syrup 15 mL  15 mL Oral Q4H PRN Serafina Mitchell, MD      . hydrALAZINE (APRESOLINE) injection 5 mg  5 mg Intravenous Q20 Min PRN Serafina Mitchell, MD      . insulin aspart (novoLOG) injection 0-9 Units  0-9 Units Subcutaneous TID WC Reyne Dumas, MD   3 Units at 03/21/16 1210  . labetalol (NORMODYNE,TRANDATE) injection 10 mg  10 mg Intravenous Q10 min PRN Serafina Mitchell, MD      . levothyroxine (SYNTHROID, LEVOTHROID) tablet 25 mcg  25 mcg Oral QAC breakfast Reyne Dumas, MD   25 mcg at 03/21/16 0620  . lisinopril (PRINIVIL,ZESTRIL) tablet 20 mg  20 mg Oral Daily Reyne Dumas, MD   20 mg at 03/21/16 0915  . metoprolol (LOPRESSOR) injection 2-5 mg  2-5 mg Intravenous Q2H PRN Serafina Mitchell, MD      . mometasone-formoterol [facility]) 200-5 MCG/ACT inhaler 2 puff  2 puff Inhalation BID Reyne Dumas, MD   2 puff at 03/21/16 1610  . montelukast (SINGULAIR) tablet 10 mg  10 mg Oral QHS Reyne Dumas, MD   10 mg at 03/20/16 2129  . ondansetron (ZOFRAN) injection 4 mg  4 mg Intravenous Q6H PRN Serafina Mitchell, MD      . oxyCODONE-acetaminophen (PERCOCET/ROXICET) 5-325 MG per tablet 1 tablet  1 tablet Oral Q4H PRN Ulyses Amor, PA-C   1 tablet at 03/20/16 1757  . pantoprazole (PROTONIX) EC tablet 40 mg  40 mg Oral Daily Reyne Dumas, MD   40 mg at 03/21/16 0915  . phenol (CHLORASEPTIC) mouth spray 1 spray  1 spray Mouth/Throat PRN Serafina Mitchell, MD      . polyethylene glycol (MIRALAX / GLYCOLAX) packet 17 g  17 g Oral BID Eugenie Filler, MD   17 g at 03/21/16 0914  . senna-docusate (Senokot-S) tablet 1 tablet  1 tablet Oral BID Eugenie Filler, MD   1 tablet at 03/21/16 0915  . sodium chloride flush (NS) 0.9 % injection 3 mL  3 mL Intravenous Q12H Reyne Dumas, MD   3 mL at 03/21/16 1029  . sodium chloride flush (NS) 0.9 % injection 3 mL  3 mL Intravenous PRN Reyne Dumas, MD      . sorbitol 70 % solution 30 mL  30  mL Oral Daily PRN Irine Seal V, MD      . warfarin (COUMADIN) tablet 7.5 mg  7.5 mg Oral ONCE-1800 Lyndee Leo, [facility]      . Warfarin - Pharmacist Dosing Inpatient   Does not apply [place], [facility]         Discharge Medications: Please see discharge summary for a list of discharge medications.  Relevant Imaging Results:  Relevant Lab Results:   Additional Information SSN: [id number]  Alla German, LCSW

## 2016-03-21 NOTE — Plan of Care (Signed)
Problem: Safety: Goal: Ability to remain free from injury will improve Outcome: Progressing Patient's bed is in lowest position, bed alarm on, call bell within reach.  Problem: Pain Managment: Goal: General experience of comfort will improve Outcome: Progressing Patient has denied pain during this shift; will continue to reasses.  Problem: Skin Integrity: Goal: Risk for impaired skin integrity will decrease Outcome: Progressing Patient's vascular sites are both level 0. Skin intact.

## 2016-03-22 ENCOUNTER — Encounter (HOSPITAL_COMMUNITY): Payer: Self-pay | Admitting: Cardiology

## 2016-03-22 DIAGNOSIS — E119 Type 2 diabetes mellitus without complications: Secondary | ICD-10-CM

## 2016-03-22 DIAGNOSIS — I420 Dilated cardiomyopathy: Secondary | ICD-10-CM

## 2016-03-22 DIAGNOSIS — Z7901 Long term (current) use of anticoagulants: Secondary | ICD-10-CM

## 2016-03-22 DIAGNOSIS — I429 Cardiomyopathy, unspecified: Secondary | ICD-10-CM

## 2016-03-22 DIAGNOSIS — D509 Iron deficiency anemia, unspecified: Secondary | ICD-10-CM | POA: Diagnosis present

## 2016-03-22 LAB — CBC
HCT: 27.3 % — ABNORMAL LOW (ref 36.0–46.0)
Hemoglobin: 8.2 g/dL — ABNORMAL LOW (ref 12.0–15.0)
MCH: 20.7 pg — AB (ref 26.0–34.0)
MCHC: 30 g/dL (ref 30.0–36.0)
MCV: 68.9 fL — AB (ref 78.0–100.0)
PLATELETS: 193 10*3/uL (ref 150–400)
RBC: 3.96 MIL/uL (ref 3.87–5.11)
RDW: 19.2 % — AB (ref 11.5–15.5)
WBC: 8.6 10*3/uL (ref 4.0–10.5)

## 2016-03-22 LAB — BASIC METABOLIC PANEL
Anion gap: 11 (ref 5–15)
BUN: 17 mg/dL (ref 6–20)
CHLORIDE: 102 mmol/L (ref 101–111)
CO2: 25 mmol/L (ref 22–32)
CREATININE: 1.04 mg/dL — AB (ref 0.44–1.00)
Calcium: 8.2 mg/dL — ABNORMAL LOW (ref 8.9–10.3)
GFR calc Af Amer: 59 mL/min — ABNORMAL LOW (ref >60)
GFR calc non Af Amer: 51 mL/min — ABNORMAL LOW (ref >60)
Glucose, Bld: 134 mg/dL — ABNORMAL HIGH (ref 65–99)
Potassium: 3.3 mmol/L — ABNORMAL LOW (ref 3.5–5.1)
SODIUM: 138 mmol/L (ref 135–145)

## 2016-03-22 LAB — GLUCOSE, CAPILLARY
GLUCOSE-CAPILLARY: 150 mg/dL — AB (ref 65–99)
GLUCOSE-CAPILLARY: 177 mg/dL — AB (ref 65–99)
Glucose-Capillary: 148 mg/dL — ABNORMAL HIGH (ref 65–99)
Glucose-Capillary: 167 mg/dL — ABNORMAL HIGH (ref 65–99)

## 2016-03-22 LAB — PROTIME-INR
INR: 2.01
Prothrombin Time: 23.1 seconds — ABNORMAL HIGH (ref 11.4–15.2)

## 2016-03-22 MED ORDER — POLYSACCHARIDE IRON COMPLEX 150 MG PO CAPS
150.0000 mg | ORAL_CAPSULE | Freq: Every day | ORAL | Status: DC
  Administered 2016-03-23: 150 mg via ORAL
  Filled 2016-03-22: qty 1

## 2016-03-22 MED ORDER — SODIUM CHLORIDE 0.9 % IV SOLN
250.0000 mg | Freq: Once | INTRAVENOUS | Status: AC
  Administered 2016-03-22: 250 mg via INTRAVENOUS
  Filled 2016-03-22: qty 20

## 2016-03-22 MED ORDER — POTASSIUM CHLORIDE CRYS ER 20 MEQ PO TBCR
40.0000 meq | EXTENDED_RELEASE_TABLET | Freq: Every day | ORAL | Status: DC
  Administered 2016-03-22 – 2016-03-23 (×2): 40 meq via ORAL
  Filled 2016-03-22 (×2): qty 2

## 2016-03-22 MED ORDER — WARFARIN SODIUM 4 MG PO TABS
4.0000 mg | ORAL_TABLET | Freq: Once | ORAL | Status: AC
  Administered 2016-03-22: 4 mg via ORAL
  Filled 2016-03-22: qty 1

## 2016-03-22 MED ORDER — CARVEDILOL 6.25 MG PO TABS
6.2500 mg | ORAL_TABLET | Freq: Two times a day (BID) | ORAL | Status: DC
  Administered 2016-03-22 – 2016-03-23 (×2): 6.25 mg via ORAL
  Filled 2016-03-22 (×2): qty 1

## 2016-03-22 NOTE — Progress Notes (Signed)
  Progress Note    03/22/2016 1:02 PM 3 Days Post-Op  Subjective:  Leg feeling well   Vitals:   03/22/16 1100 03/22/16 1300  BP: 131/70   Pulse: 80   Resp: 18   Temp:  97.1 F (36.2 C)    Physical Exam: aaox3 Groin sites are soft Left foot is warm and sensorimotor in tact  CBC    Component Value Date/Time   WBC 8.6 03/22/2016 0452   RBC 3.96 03/22/2016 0452   HGB 8.2 (L) 03/22/2016 0452   HCT 27.3 (L) 03/22/2016 0452   PLT 193 03/22/2016 0452   MCV 68.9 (L) 03/22/2016 0452   MCH 20.7 (L) 03/22/2016 0452   MCHC 30.0 03/22/2016 0452   RDW 19.2 (H) 03/22/2016 0452   LYMPHSABS 1.8 03/18/2016 1132   MONOABS 1.0 03/18/2016 1132   EOSABS 0.2 03/18/2016 1132   BASOSABS 0.0 03/18/2016 1132    BMET    Component Value Date/Time   NA 138 03/22/2016 0452   K 3.3 (L) 03/22/2016 0452   CL 102 03/22/2016 0452   CO2 25 03/22/2016 0452   GLUCOSE 134 (H) 03/22/2016 0452   BUN 17 03/22/2016 0452   CREATININE 1.04 (H) 03/22/2016 0452   CALCIUM 8.2 (L) 03/22/2016 0452   GFRNONAA 51 (L) 03/22/2016 0452   GFRAA 59 (L) 03/22/2016 0452    INR    Component Value Date/Time   INR 2.01 03/22/2016 0452     Intake/Output Summary (Last 24 hours) at 03/22/16 1302 Last data filed at 03/22/16 0831  Gross per 24 hour  Intake              860 ml  Output             1050 ml  Net             -190 ml     Assessment:  76 y.o. female is s/p bilateral iliac artery stenting for left foot rest pain, has right aka. Rest pain resolved with non-compressible abi's on left. Has vein for possible bypass  Plan: Continue sa and statin Ok for discharge from vascular standpoint F/u in 4 weeks for further evaluation without studies, if pain persists would need left fem-pop bypass  Donta Mcinroy C. Donzetta Matters, MD [facility] [place] Office: [phone] Pager: [phone]  03/22/2016 1:02 PM

## 2016-03-22 NOTE — Clinical Social Work Note (Signed)
Clinical Social Worker continuing to follow patient and family for support and discharge planning needs.  Patient daughter, provided with bed offers and has chosen [facility].  Patient family member, Skeet Latch, plans to be at [facility] today between 1:30-2:00 to complete paperwork.  CSW has submitted all clinical information to [facility] for authorization.  CSW remains available for support and to facilitate patient discharge needs once medically stable.  Barbette Or, [place]

## 2016-03-22 NOTE — Clinical Social Work Note (Signed)
Clinical Social Worker continuing to follow patient and family for support and discharge planning needs.  [employer] authorization received for 3/10 Josem Kaufmann number [id number]).  Paperwork completed by family at [place] and facility updated with discharge plan.  CSW spoke with patient daughter over the phone to provide update.  CSW remains available for support and to facilitate patient discharge needs once medically stable.  Barbette Or, [place]

## 2016-03-22 NOTE — Care Management Important Message (Signed)
Important Message  Patient Details  Name: Sarah Moss MRN: [id number] Date of Birth: March 20, 1940   Medicare Important Message Given:  Yes    Nathen May 03/22/2016, 2:09 PM

## 2016-03-22 NOTE — Clinical Social Work Note (Signed)
Clinical Social Work Assessment  Patient Details  Name: Sarah Moss MRN: [id number] Date of Birth: 1940/03/29  Date of referral:  03/21/16               Reason for consult:  Facility Placement                Permission sought to share information with:  Family Supports Permission granted to share information::  Yes, Verbal Permission Granted  Name::     Modena Morrow  Relationship::  Daughter  Contact Information:  [phone]  Housing/Transportation Living arrangements for the past 2 months:  [place] of Information:  Patient, Adult Children, Other (Comment Required) (Sister - Gay Filler) Patient Interpreter Needed:  None Criminal Activity/Legal Involvement Pertinent to Current Situation/Hospitalization:  No - Comment as needed Significant Relationships:  Adult Children, Siblings Lives with:  Siblings Do you feel safe going back to the place where you live?  Yes Need for family participation in patient care:  Yes (Comment)  Care giving concerns:  Patient sister, Gay Filler at bedside and she is concerned patient will not receive adequate care at home and will likely need to transition to long term SNF placement.   Social Worker assessment / plan:  Holiday representative met with patient and patient sister at bedside to offer support and discuss patient needs at discharge.  Patient sister, Gay Filler, states that patient has been living with her younger sister, Beverlee Nims and it has gotten to be too physically demanding to continue to manage at home.  Patient sister recommends that CSW reach out to patient daughter, Otila Kluver to get permission regarding SNF placement at discharge.  CSW spoke with patient daughter, Otila Kluver, over the phone who is agreeable with ST-SNF placement with the possibility of transitioning to more of a long term placement if needed.  CSW to initiate referral and follow up with patient daughter regarding available bed offers.  CSW remains available for support and to  facilitate patient discharge needs once medically stable.  Employment status:  Retired Glass blower/designer) PT Recommendations:  [place] / Referral to community resources:  [place]  Patient/Family's Response to care:  Patient and family verbalize understanding of CSW role and appreciation for CSW support and concern.  Patient and family agree that SNF placement is patient best option at discharge.  Patient/Family's Understanding of and Emotional Response to Diagnosis, Current Treatment, and Prognosis:  Patient and family understanding of patient limitations and barriers with return home.  Patient and family emotionally appropriate for transition from home to SNF placement.  Emotional Assessment Appearance:  Appears stated age Attitude/Demeanor/Rapport:  Inconsistent, Other (not engaged) Affect (typically observed):  Accepting, Calm, Flat, Overwhelmed Orientation:  Oriented to Self, Oriented to Place, Oriented to  Time Alcohol / Substance use:  Not Applicable Psych involvement (Current and /or in the community):  No (Comment)  Discharge Needs  Concerns to be addressed:  Coping/Stress Concerns Readmission within the last 30 days:  Yes Current discharge risk:  Lack of support system, Physical Impairment Barriers to Discharge:  Continued Medical Work up  [employer], [place]

## 2016-03-22 NOTE — Progress Notes (Signed)
[place] for Warfarin Indication: atrial fibrillation  Allergies  Allergen Reactions  . Codeine Itching and Nausea And Vomiting  . Ativan [Lorazepam] Other (See Comments)    "Makes me confused and crazy"  . Ambien [Zolpidem] Other (See Comments)    "Has the opposite effect; makes me more alert/hyper"    Patient Measurements: Height: 5\' 5"  (165.1 cm) Weight: 155 lb 14.4 oz (70.7 kg) IBW/kg (Calculated) : 57  Height: 5'5" Weight (as of 03/05/2016): 64.4 kg   Vital Signs: Temp: 98.5 F (36.9 C) (03/09 0343) Temp Source: Oral (03/09 0343) BP: 135/66 (03/09 0830) Pulse Rate: 80 (03/09 0829)  Labs:  Recent Labs  03/20/16 0503 03/21/16 0355 03/22/16 0452  HGB 9.2* 8.8* 8.2*  HCT 31.2* 29.4* 27.3*  PLT 235 196 193  LABPROT 20.2* 19.8* 23.1*  INR 1.70 1.66 2.01  CREATININE 1.19* 1.13* 1.04*    Estimated Creatinine Clearance: 45.4 mL/min (by C-G formula based on SCr of 1.04 mg/dL (H)).   Medical History: Past Medical History:  Diagnosis Date  . Asthma   . CHF (congestive heart failure) ([place])   . COPD (chronic obstructive pulmonary disease) ([place])   . Diabetes mellitus without complication ([place])   . GERD (gastroesophageal reflux disease)   . Hx of right BKA ([place])   . Hypertension      Assessment: 75 y/oF on chronic warfarin therapy for atrial fibrillation who presents to [facility] ED on 03/18/2016 with weakness, shortness of breath, and left foot pain. Pharmacy consulted to manage warfarin dosing while patient in the hospital.  S/p angiogram with stent to left and right common iliac artery on 3/6.  Warfarin resumed 3/7, INR up to 2 today. Hgb trending down 9.2>8.2 today, no bleeding issues noted overnight. Back to home dose at discharge with INR now therapeutic.   Home warfarin dose reported as 4mg  alternating with 6mg  daily, with last dose of 4mg  taken on 03/17/2016.  Goal of Therapy:  INR 2-3 Monitor platelets by anticoagulation  protocol: Yes   Plan Resume home dose of warfarin with 4mg  tonight Daily INR for now  Erin Hearing PharmD., BCPS Clinical Pharmacist Pager [phone] 03/22/2016 10:01 AM

## 2016-03-22 NOTE — Plan of Care (Signed)
Problem: Tissue Perfusion: Goal: Risk factors for ineffective tissue perfusion will decrease Outcome: Completed/Met Date Met: 03/22/16 Pt is coumadin.   Problem: Bowel/Gastric: Goal: Will not experience complications related to bowel motility Outcome: Completed/Met Date Met: 03/22/16 Pt was able to have a bowel movement yesterday.    

## 2016-03-22 NOTE — Progress Notes (Signed)
Physical Therapy Treatment Patient Details Name: Sarah Moss MRN: [id number] DOB: 1940/10/04 Today's Date: 03/22/2016    History of Present Illness 76 yo female with onset of CHF after being at home with family, had femoral a sheath removed 3/6 and has B iliac stents now.  PMHx:  L foot ulcer, asthma, COPD, CHF, DM, R BKA at age 57    PT Comments    Pt eager for OOB mobility, however due to edema in R residual limb pt is unable to don prosthetic.  Encouraged pt to leave sleeve on R residual limb to A with compression and edema management.  Feel pt will need SNF level of care and therapies at D/C to maximize independence prior to returning to home.     Follow Up Recommendations  SNF     Equipment Recommendations  None recommended by PT    Recommendations for Other Services       Precautions / Restrictions Precautions Precautions: Fall Precaution Comments: R Prosthetic LE Restrictions Weight Bearing Restrictions: No    Mobility  Bed Mobility Overal bed mobility: Needs Assistance Bed Mobility: Supine to Sit     Supine to sit: Supervision     General bed mobility comments: definite use of bed rail for bringing trunk up to sitting.    Transfers Overall transfer level: Needs assistance Equipment used: 1 person hand held assist;Rolling walker (2 wheeled) Transfers: Sit to/from [facility] Sit to Stand: Min assist Stand pivot transfers: Min assist       General transfer comment: pt coming to standing with MinA for balance in an attempt to don prosthetic R LE, however LE edema preventing securing of prosthetic.  pt able to pivot to recliner with MinA and use of RW.    Ambulation/Gait                 Stairs            Wheelchair Mobility    Modified Rankin (Stroke Patients Only)       Balance Overall balance assessment: Needs assistance Sitting-balance support: No upper extremity supported;Feet supported Sitting balance-Leahy  Scale: Good     Standing balance support: Bilateral upper extremity supported;During functional activity Standing balance-Leahy Scale: Poor                      Cognition Arousal/Alertness: Awake/alert Behavior During Therapy: Flat affect Overall Cognitive Status: Difficult to assess                 General Comments: pt with very minimal verbalizations and only speaks when answering PT's questions.      Exercises      General Comments        Pertinent Vitals/Pain Pain Assessment: No/denies pain    Home Living                      Prior Function            PT Goals (current goals can now be found in the care plan section) Acute Rehab PT Goals Patient Stated Goal: To walk PT Goal Formulation: With patient/family Time For Goal Achievement: 04/03/16 Potential to Achieve Goals: Good Progress towards PT goals: Progressing toward goals    Frequency    Min 3X/week      PT Plan Current plan remains appropriate    Co-evaluation             End of Session Equipment Utilized During  Treatment: Gait belt Activity Tolerance: Patient tolerated treatment well Patient left: in chair;with call bell/phone within reach;with family/visitor present Nurse Communication: Mobility status PT Visit Diagnosis: Unsteadiness on feet (R26.81);Muscle weakness (generalized) (M62.81)     Time: 9242-6834 PT Time Calculation (min) (ACUTE ONLY): 24 min  Charges:  $Therapeutic Activity: 23-37 mins                    G CodesCatarina Hartshorn, PT [phone] 03/22/2016, 11:08 AM

## 2016-03-22 NOTE — Consult Note (Signed)
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   She was most recently hospitalized and discharged from [facility] on 12/26/15 for CHF exacerbation. Her most recent 2-D echo 10/02/15, showed normal LV systolic function-EF 06-23%, moderate MR and TR, moderate pulmonary hypertension, biatrial enlargement. On admission she also complained of foot pain. She was seen by Dr Trula Slade. ABI's suggested significant PVD and her Coumadin was reversed in preparation for PV angiogram. On 03/19/16 she underwent PTA with stent to her LCIA and her RCIA. The pt was also felt to have acute CHF. An echo done 03/20/16 showed LVD, apparently new, with an EF of 20% and we are asked to see her in consult. The pt is unable to give me any details of her PMH. "How did I get here". She could not tell me the day or month. She denies chest pain or dyspnea.    PMHx:  Past Medical History:  Diagnosis Date  . Asthma   . CHF (congestive heart failure) ([place])   . COPD (chronic obstructive pulmonary disease) ([place])   . Diabetes mellitus without complication ([place])   . GERD (gastroesophageal reflux disease)   . Hx of right BKA ([place])   . Hypertension     Past Surgical History:  Procedure Laterality Date  . LOWER EXTREMITY ANGIOGRAPHY N/A 03/19/2016   Procedure: Lower Extremity Angiography;  Surgeon: Serafina Mitchell, MD;  Location: [facility];  Service: Cardiovascular;  Laterality: N/A;  . PERIPHERAL VASCULAR INTERVENTION Bilateral 03/19/2016   Procedure: Peripheral Vascular Intervention;   Surgeon: Serafina Mitchell, MD;  Location: [facility];  Service: Cardiovascular;  Laterality: Bilateral;  iliacs    SOCHx:  reports that she has quit smoking. She has never used smokeless tobacco. Her alcohol and drug histories are not on file.  FAMHx: Family History  Problem Relation Age of Onset  . Stroke Mother   . Diabetes Mother   . Cancer Father     ALLERGIES: Allergies  Allergen Reactions  . Codeine Itching and Nausea And Vomiting  . Ativan [Lorazepam] Other (See Comments)    "Makes me confused and crazy"  . Ambien [Zolpidem] Other (See Comments)    "Has the opposite effect; makes me more alert/hyper"    ROS: Review of Systems: General: negative for chills, fever, night sweats or weight changes.  Cardiovascular: negative for chest pain, dyspnea on exertion, edema, orthopnea, palpitations, paroxysmal nocturnal dyspnea or shortness of breath HEENT: negative for any visual disturbances, blindness, glaucoma Dermatological: negative for rash Respiratory: negative for cough, hemoptysis, or wheezing Urologic: negative for hematuria or dysuria Abdominal: negative for nausea, vomiting, diarrhea, bright red blood per rectum, melena, or hematemesis Neurologic: negative for visual changes, syncope, or dizziness Musculoskeletal: negative for back pain, joint pain, or swelling Psych: cooperative and appropriate but unable to give me the day or month and did not know where she was.  All other systems reviewed and are otherwise negative except as noted above.   HOME MEDICATIONS: Prior to Admission medications   Medication Sig  Start Date End Date Taking? Authorizing Provider  ADVAIR DISKUS 250-50 MCG/DOSE AEPB Inhale 1 puff into the lungs 2 (two) times daily. 03/08/16  Yes Historical Provider, MD  ALPRAZolam Duanne Moron) 0.25 MG tablet Take 0.25 mg by mouth every 12 (twelve) hours as needed for anxiety.   Yes Historical Provider, MD  atenolol (TENORMIN) 25 MG tablet Take 25 mg by  mouth daily. 02/22/15  Yes Historical Provider, MD  atorvastatin (LIPITOR) 40 MG tablet Take 40 mg by mouth daily. 05/30/14  Yes Historical Provider, MD  diltiazem (TIAZAC) 120 MG 24 hr capsule Take 120 mg by mouth daily. 02/22/15  Yes Historical Provider, MD  DULoxetine (CYMBALTA) 60 MG capsule Take 60 mg by mouth daily.   Yes Historical Provider, MD  furosemide (LASIX) 40 MG tablet Take 40 mg by mouth daily.   Yes Historical Provider, MD  gabapentin (NEURONTIN) 300 MG capsule Take 300 mg by mouth at bedtime. 05/30/14  Yes Historical Provider, MD  levothyroxine (SYNTHROID, LEVOTHROID) 25 MCG tablet Take 25 mcg by mouth daily. 05/30/14  Yes Historical Provider, MD  lisinopril (PRINIVIL,ZESTRIL) 20 MG tablet Take 20 mg by mouth daily.   Yes Historical Provider, MD  metFORMIN (GLUCOPHAGE) 1000 MG tablet Take 1,000 mg by mouth 2 (two) times daily. 05/30/14  Yes Historical Provider, MD  montelukast (SINGULAIR) 10 MG tablet Take 10 mg by mouth at bedtime.   Yes Historical Provider, MD  nitroGLYCERIN (NITROSTAT) 0.4 MG SL tablet Place 0.4 mg under the tongue every 5 (five) minutes as needed for chest pain.   Yes Historical Provider, MD  omeprazole (PRILOSEC) 40 MG capsule Take 40 mg by mouth daily.   Yes Historical Provider, MD  potassium chloride (K-DUR) 10 MEQ tablet Take 10 mEq by mouth 2 (two) times daily. For 30 days 02/12/16  Yes Historical Provider, MD  VENTOLIN HFA 108 (90 Base) MCG/ACT inhaler Inhale 1 puff into the lungs every 6 (six) hours as needed for shortness of breath.  03/08/16  Yes Historical Provider, MD  warfarin (COUMADIN) 4 MG tablet Take 4-6 mg by mouth daily. Patient alternates between 4 mg and 6 mg daily. Patient had 4 mg on 03/17/16 07/04/15 07/03/16 Yes Historical Provider, MD    HOSPITAL MEDICATIONS: I have reviewed the patient's current medications. Scheduled Meds: . sodium chloride   Intravenous Once  . aspirin EC  81 mg Oral Daily  . atenolol  25 mg Oral Daily  . atorvastatin  40 mg  Oral q1800  . diltiazem  120 mg Oral Daily  . DULoxetine  60 mg Oral Daily  . furosemide  40 mg Oral BID  . gabapentin  300 mg Oral QHS  . insulin aspart  0-9 Units Subcutaneous TID WC  . [START ON 03/23/2016] iron polysaccharides  150 mg Oral Daily  . levothyroxine  25 mcg Oral QAC breakfast  . lisinopril  20 mg Oral Daily  . mometasone-formoterol  2 puff Inhalation BID  . montelukast  10 mg Oral QHS  . pantoprazole  40 mg Oral Daily  . polyethylene glycol  17 g Oral BID  . potassium chloride  40 mEq Oral Daily  . senna-docusate  1 tablet Oral BID  . sodium chloride flush  3 mL Intravenous Q12H  . warfarin  4 mg Oral ONCE-1800  . Warfarin - Pharmacist Dosing Inpatient   Does not apply q1800   Continuous Infusions: . sodium chloride 10 mL/hr at 03/19/16 2241   PRN Meds:.sodium chloride, sodium chloride, acetaminophen **OR** acetaminophen, albuterol,  ALPRAZolam, alum & mag hydroxide-simeth, guaiFENesin-dextromethorphan, hydrALAZINE, labetalol, metoprolol, ondansetron, oxyCODONE-acetaminophen, phenol, sodium chloride flush, sorbitol   VITALS: Blood pressure 131/70, pulse 80, temperature 97.1 F (36.2 C), temperature source Oral, resp. rate 18, height 5\' 5"  (1.651 m), weight 155 lb 14.4 oz (70.7 kg), SpO2 95 %.  PHYSICAL EXAM: General appearance: alert, cooperative, no distress and mildly obese Neck: no carotid bruit and no JVD Lungs: clear to auscultation bilaterally Heart: regular rate and rhythm Abdomen: soft, non-tender; bowel sounds normal; no masses,  no organomegaly and RUQ surgical scar Extremities: Rt BKA, LLE warm, trace edema Pulses: decreased Skin: pale, cool, dry Neurologic: Grossly normal but confused   LABS: Results for orders placed or performed during the hospital encounter of 03/18/16 (from the past 24 hour(s))  Glucose, capillary     Status: Abnormal   Collection Time: 03/21/16  4:31 PM  Result Value Ref Range   Glucose-Capillary 219 (H) 65 - 99 mg/dL    Glucose, capillary     Status: Abnormal   Collection Time: 03/21/16  9:42 PM  Result Value Ref Range   Glucose-Capillary 137 (H) 65 - 99 mg/dL  Protime-INR     Status: Abnormal   Collection Time: 03/22/16  4:52 AM  Result Value Ref Range   Prothrombin Time 23.1 (H) 11.4 - 15.2 seconds   INR 5.85   Basic metabolic panel     Status: Abnormal   Collection Time: 03/22/16  4:52 AM  Result Value Ref Range   Sodium 138 135 - 145 mmol/L   Potassium 3.3 (L) 3.5 - 5.1 mmol/L   Chloride 102 101 - 111 mmol/L   CO2 25 22 - 32 mmol/L   Glucose, Bld 134 (H) 65 - 99 mg/dL   BUN 17 6 - 20 mg/dL   Creatinine, Ser 1.04 (H) 0.44 - 1.00 mg/dL   Calcium 8.2 (L) 8.9 - 10.3 mg/dL   GFR calc non Af Amer 51 (L) >60 mL/min   GFR calc Af Amer 59 (L) >60 mL/min   Anion gap 11 5 - 15  CBC     Status: Abnormal   Collection Time: 03/22/16  4:52 AM  Result Value Ref Range   WBC 8.6 4.0 - 10.5 K/uL   RBC 3.96 3.87 - 5.11 MIL/uL   Hemoglobin 8.2 (L) 12.0 - 15.0 g/dL   HCT 27.3 (L) 36.0 - 46.0 %   MCV 68.9 (L) 78.0 - 100.0 fL   MCH 20.7 (L) 26.0 - 34.0 pg   MCHC 30.0 30.0 - 36.0 g/dL   RDW 19.2 (H) 11.5 - 15.5 %   Platelets 193 150 - 400 K/uL  Glucose, capillary     Status: Abnormal   Collection Time: 03/22/16  7:18 AM  Result Value Ref Range   Glucose-Capillary 148 (H) 65 - 99 mg/dL  Glucose, capillary     Status: Abnormal   Collection Time: 03/22/16 12:41 PM  Result Value Ref Range   Glucose-Capillary 167 (H) 65 - 99 mg/dL    EKG: Paced  CXR 2 view 03/18/16 IMPRESSION: Central mild vascular congestion without convincing pulmonary edema. Stable trace bilateral pleural effusion with basilar atelectasis. No segmental infiltrates.  Echo 03/20/16 Study Conclusions  - Left ventricle: The cavity size was normal. Wall thickness was   normal. Systolic function was severely reduced. The estimated   ejection fraction was in the range of 20% to 25%. Diffuse   hypokinesis. Doppler parameters are  consistent with both elevated   ventricular end-diastolic filling pressure  and elevated left   atrial filling pressure. - Aortic valve: Valve area (VTI): 0.63 cm^2. Valve area (Vmax):   0.89 cm^2. Valve area (Vmean): 0.75 cm^2. - Mitral valve: Calcified annulus. Mildly thickened leaflets .   There was mild regurgitation. - Right ventricle: The cavity size was moderately dilated. - Right atrium: The atrium was moderately dilated. - Atrial septum: No defect or patent foramen ovale was identified. - Tricuspid valve: There was moderate-severe regurgitation. - Pulmonary arteries: PA peak pressure: 39 mm Hg (S). - Pericardium, extracardiac: There was a left pleural effusion.    IMPRESSION:  1. Acute systolic CHF on admission- BNP was 765. She has not diuresed by I/O or Wgts but appears compensated on exam.  2. New LVD- EF now 20% with global HK, according to records EF was 50-55% at [facility] Sept 2017  3. CAF/SSS- s/p PTVDP. CHADs VASc=7  4. St Jude PTVDP- s/p St Jude pacemaker. Followed by Dr [name] in [facility].  5. Chronic Coumadin Rx- INR 2.01  6. Iron def anemia- transfused this admission  7. Confusion- ? Baseline dementia  8. PVD- s/p urgent bilateral CIA PTA and stenting this admission. H/O of prior LE stenting, followed at [facility].   RECOMMENDATION: MD to see.   In light of new LVD consider changing Atenolol to Coreg- titrate Coreg up and stop Diltiazem as an OP. Would defer to her primary cardiologist whether she is a candidate for diagnostic cath.   Time Spent Directly with Patient: 48 minutes  Kerin Ransom, [place] beeper 03/22/2016, 1:19 PM   Personally seen and examined. Agree with above.  76 year old with confusion, questionable baseline dementia, severe peripheral vascular disease and pacemaker on chronic Coumadin with newly discovered systolic heart failure/cardiomyopathy, EF 20%, down from 50-55% at [facility] in September.  Pleasant, confused. Does  not recall having anything done to her heart. Has a pacemaker in place.  - She really does not know why she is here. She denies any chest pain. She is not short of breath. - In light of her new cardiomyopathy, I will change her atenolol to Coreg and stop diltiazem. - Continue ACE inhibitor, lisinopril. - Let's she does on medical therapy and she can follow-up with Dr. [name] to re-echo and determine whether or not cardiac catheterization is necessary. This might be challenging given her underlying dementia. It may be best to continue with medical management. - Telemetry reveals paced rhythm. No adverse arrhythmias. - Continue to aggressively treat peripheral vascular disease. Right BKA from an accident. Prior left superficial femoral artery stenting. Bilateral iliac artery stenting. Follow here by vascular surgery.  Candee Furbish, MD   Candee Furbish, MD

## 2016-03-22 NOTE — Progress Notes (Addendum)
PROGRESS NOTE    Sarah Moss  DJM:[id number] DOB: 01-11-1941 DOA: 03/18/2016 PCP: [name], PA-C    Brief Narrative:  76 year old female with a history of HTN, DM, A-fib with tachy brady s/p pacemaker, hypothyroidism, diastolic HF, , COPD (not O2 dependent) and right BKA from an accident, PAD s/p stent in left leg (followed by vascular in [place]) presents with c/o weakness and shortness of breath. She has progressive shortness of breath that "comes and goes" and is worse with activity over the last 1 month. She denies any chest pain. She denies any palpitations or lightheadedness. She was most recently hospitalized and discharged from [facility] on 12/26/15 for CHF exacerbation. Most recent 2-D echo 10/02/15, showed low normal LV systolic function 22-29%, moderate MR and TR, moderate pulmonary hypertension, biatrial enlargement,  Patient also complains of left foot pain. Patient was recently evaluated by the vascular surgery on 03/08/16 Dr. Kathreen Cosier, [name], MD, [facility] .Exam indicates significantly abnormal PVR. Suggests superficial femoral artery occlusion. Her left ABI is non-compressible, though her toe pressure of 60 is adequate for wound healing. Her left ankle waveform is pulsatile, but slightly dampened. Her symptoms right now are not consistent with ischemia rest pain and she has no tissue loss. Compression stockings were recommended. Angiogram was recommended, she would like to hold off on it. Patient was planned to have a 3 week follow-up with her vascular surgeon. ED course BP 131/62  Pulse 79  Temp 97.6 F (36.4 C) (Oral)  Resp 20  SpO2 98% Chest x-ray shows central mild vascular congestion without pulmonary edema Hemoglobin 9.8, MCV 69.0, creatinine 1.11, BNP 765.5, troponin 0.02, INR 2.44, UA negative, Patient evaluated for shortness of breath, left foot pain.    Assessment & Plan:   Principal Problem:   CHF (congestive heart  failure) (HCC) Active Problems:   Atrial fibrillation (HCC)   Hx of right BKA (HCC)   COPD (chronic obstructive pulmonary disease) (HCC)   Diabetes mellitus ([place])   Gastroesophageal reflux disease   Acute on chronic heart failure (HCC)   PAD (peripheral artery disease) (HCC)   Cardiomyopathy ([place])   Diabetes mellitus with hemoglobin A1c goal of 7.0%-8.0% ([place])  #1 acute on chronic CHF exacerbation Patient likely with acute on chronic diastolic heart failure presented with shortness of breath lower extremity edema. Significant clinical improvement. Patient likely currently euvolemic. Patient with 2-D echo done on 09/30/2016. Repeat 2-D echo with EF of 20-25% with diffuse hypokinesis. Patient with urine output of 850 mL over the past 24 hours. Changed IV Lasix to oral lasix.  Patient with a severely reduced EF of 20-25% with diffuse hypokinesis. Continue aspirin, beta blocker, statin, Cardizem, Lasix, lisinopril. Will consult with cardiology. Will likely need outpatient follow-up with her cardiologist.  #2 cardiomyopathy Questionable etiology. Patient noted to have a cardiomyopathy with a EF of 20-25% with diffuse hypokinesis on 2-D echo from 03/20/2016 which is a change from prior echo on 09/30/2016 that had a normal EF with no wall motion abnormalities. Patient is a vasculopath and admitted with left foot pain status post aortogram and left leg angiogram and intervention with bilateral iliac artery stenting per vascular surgery 03/19/2016 during this hospitalization. Patient denies any chest pain. Patient with multiple risk factors. Concern for possible ischemic cardiomyopathy. Continue current regimen of aspirin, atenolol, Lipitor, Cardizem, Lasix, lisinopril, Coumadin. Consulted cardiology for further evaluation and management. Patient was inquiring about finding a new cardiologist.  #3 left foot pain Uric acid level  elevated. ABIs with left ABI of 0.27 suggestive of critical disease at rest.  Patient has been seen in consultation by vascular surgery and patient s/p aortogram with left leg angiogram and intervention(bilateral iliac artery stenting) 03/19/2016 per vascular surgery. Pending left foot improved. ABIs on the left noncompressible. Continue aspirin and statin. Patient's Coumadin has been resumed. Patient will need to follow-up with vascular surgery in 4 weeks post discharge. Vascular following and appreciate input and recommendation.   #4 history of atrial fibrillation Continue Cardizem and atenolol for rate control. Coumadin has been resumed. Outpatient follow-up.   #5 COPD Stable. Continue Dulera, singular, PPI,   #6 gastroesophageal reflux disease PPI.  #7 diabetes mellitus SSI  #8 hypothyroidism Continue home dose Synthroid.  #9 hypertension Continue lisinopril, Cardizem, Lasix, atenolol.  #10 iron deficiency anemia H&H stable. Will give a 2nd dose of IV iron today. Start oral iron supplementation tomorrow. Outpatient follow-up   11 history of right BKA    DVT prophylaxis: Coumadin has been resumed.  Code Status: Full Family Communication: Updated patient. No family at bedside. Disposition Plan: SNF when symptoms have resolved and heart failure improved and patient on oral diuretics and evaluated by cardiology, hopefully tomorrow..   Consultants:   Vascular surgery: Dr. Donzetta Matters 03/18/2016  Procedures:   ABIs 03/18/2016  2-D echo 03/07 discharge sixth/2018  Chest x-ray 03/18/2016  Aortogram with left leg angiogram and possible intervention pending 03/19/2016  2 units FFP 03/19/2016  Repeat ABIs 03/20/2016  Vein mapping 03/20/2016  Antimicrobials:   None   Subjective: Patient alert. Patient states left foot pain improved however some pain in the foot on dorsiflexion. Shortness of breath improved. Patient denies any chest pain.  Objective: Vitals:   03/22/16 0343 03/22/16 0829 03/22/16 0830 03/22/16 0831  BP: 129/65 135/66 135/66     Pulse: 80 80    Resp: 18     Temp: 98.5 F (36.9 C)     TempSrc: Oral     SpO2: 94%   96%  Weight: 70.7 kg (155 lb 14.4 oz)     Height:        Intake/Output Summary (Last 24 hours) at 03/22/16 1031 Last data filed at 03/22/16 0831  Gross per 24 hour  Intake             1110 ml  Output             1050 ml  Net               60 ml   Filed Weights   03/20/16 0500 03/21/16 0500 03/22/16 0343  Weight: 69 kg (152 lb 3.2 oz) 70.9 kg (156 lb 3.2 oz) 70.7 kg (155 lb 14.4 oz)    Examination:  General exam: Appears calm and comfortable  Respiratory system: Clear to auscultation. Respiratory effort normal. Cardiovascular system: S1 & S2 heard, RRR with 3/6 SEM. No JVD, rubs, gallops or clicks. No pedal edema. Gastrointestinal system: Abdomen is nondistended, soft and nontender. No organomegaly or masses felt. Normal bowel sounds heard. Central nervous system: Alert and oriented. No focal neurological deficits. Extremities: s/p R BKA. LLE with no edema, left foot less rubor, less ttp. Skin: No rashes, lesions or ulcers Psychiatry: Judgement and insight appear normal. Mood & affect appropriate.     Data Reviewed: I have personally reviewed following labs and imaging studies  CBC:  Recent Labs Lab 03/18/16 1132 03/19/16 1024 03/20/16 0503 03/21/16 0355 03/22/16 0452  WBC 8.0 5.7 7.5 7.6 8.6  NEUTROABS 5.0  --   --   --   --   HGB 9.8* 9.8* 9.2* 8.8* 8.2*  HCT 31.9* 31.8* 31.2* 29.4* 27.3*  MCV 69.0* 69.0* 69.6* 69.5* 68.9*  PLT 265 221 235 196 408   Basic Metabolic Panel:  Recent Labs Lab 03/18/16 1132 03/19/16 0807 03/19/16 1024 03/20/16 0503 03/21/16 0355 03/22/16 0452  NA 139  --  138 138 138 138  K 4.3  --  4.1 4.2 3.8 3.3*  CL 106  --  104 99* 103 102  CO2 22  --  23 22 23 25   GLUCOSE 137*  --  123* 127* 119* 134*  BUN 27*  --  26* 26* 22* 17  CREATININE 1.11*  --  1.15* 1.19* 1.13* 1.04*  CALCIUM 9.0  --  9.0 9.1 8.6* 8.2*  MG 1.5* 1.7  --  1.7 2.1  --     GFR: Estimated Creatinine Clearance: 45.4 mL/min (by C-G formula based on SCr of 1.04 mg/dL (H)). Liver Function Tests:  Recent Labs Lab 03/18/16 1132 03/19/16 1024  AST 41 27  ALT 17 15  ALKPHOS 75 70  BILITOT 1.0 1.2  PROT 7.5 6.6  ALBUMIN 3.8 3.3*   No results for input(s): LIPASE, AMYLASE in the last 168 hours. No results for input(s): AMMONIA in the last 168 hours. Coagulation Profile:  Recent Labs Lab 03/19/16 1024 03/19/16 1307 03/20/16 0503 03/21/16 0355 03/22/16 0452  INR 2.18 1.71 1.70 1.66 2.01   Cardiac Enzymes: No results for input(s): CKTOTAL, CKMB, CKMBINDEX, TROPONINI in the last 168 hours. BNP (last 3 results) No results for input(s): PROBNP in the last 8760 hours. HbA1C: No results for input(s): HGBA1C in the last 72 hours. CBG:  Recent Labs Lab 03/21/16 0741 03/21/16 1141 03/21/16 1631 03/21/16 2142 03/22/16 0718  GLUCAP 132* 241* 219* 137* 148*   Lipid Profile: No results for input(s): CHOL, HDL, LDLCALC, TRIG, CHOLHDL, LDLDIRECT in the last 72 hours. Thyroid Function Tests: No results for input(s): TSH, T4TOTAL, FREET4, T3FREE, THYROIDAB in the last 72 hours. Anemia Panel: No results for input(s): VITAMINB12, FOLATE, FERRITIN, TIBC, IRON, RETICCTPCT in the last 72 hours. Sepsis Labs: No results for input(s): PROCALCITON, LATICACIDVEN in the last 168 hours.  No results found for this or any previous visit (from the past 240 hour(s)).       Radiology Studies: No results found.      Scheduled Meds: . sodium chloride   Intravenous Once  . aspirin EC  81 mg Oral Daily  . atenolol  25 mg Oral Daily  . atorvastatin  40 mg Oral q1800  . diltiazem  120 mg Oral Daily  . DULoxetine  60 mg Oral Daily  . furosemide  40 mg Oral BID  . gabapentin  300 mg Oral QHS  . insulin aspart  0-9 Units Subcutaneous TID WC  . [START ON 03/23/2016] iron polysaccharides  150 mg Oral Daily  . levothyroxine  25 mcg Oral QAC breakfast  .  lisinopril  20 mg Oral Daily  . mometasone-formoterol  2 puff Inhalation BID  . montelukast  10 mg Oral QHS  . pantoprazole  40 mg Oral Daily  . polyethylene glycol  17 g Oral BID  . potassium chloride  40 mEq Oral Daily  . senna-docusate  1 tablet Oral BID  . sodium chloride flush  3 mL Intravenous Q12H  . warfarin  4 mg Oral ONCE-1800  . Warfarin - Pharmacist Dosing Inpatient   Does  not apply q1800   Continuous Infusions: . sodium chloride 10 mL/hr at 03/19/16 2241     LOS: 4 days    Time spent: 64 minutes    Chanah Tidmore, MD [facility] Pager [phone]  If 7PM-7AM, please contact night-coverage [email or web address] Password [email or web address] 03/22/2016, 10:31 AM

## 2016-03-22 NOTE — Clinical Social Work Placement (Signed)
   CLINICAL SOCIAL WORK PLACEMENT  NOTE  Date:  03/22/2016  Patient Details  Name: Sarah Moss MRN: [id number] Date of Birth: 05-Jan-1941  Clinical Social Work is seeking post-discharge placement for this patient at the [place] level of care (*CSW will initial, date and re-position this form in  chart as items are completed):  Yes   Patient/family provided with [place] Work Department's list of facilities offering this level of care within the geographic area requested by the patient (or if unable, by the patient's family).  Yes   Patient/family informed of their freedom to choose among providers that offer the needed level of care, that participate in Medicare, Medicaid or managed care program needed by the patient, have an available bed and are willing to accept the patient.  Yes   Patient/family informed of [facility]'s ownership interest in [facility] and [facility], as well as of the fact that they are under no obligation to receive care at these facilities.  PASRR submitted to EDS on       PASRR number received on       Existing PASRR number confirmed on 03/21/16     FL2 transmitted to all facilities in geographic area requested by pt/family on 03/21/16     FL2 transmitted to all facilities within larger geographic area on       Patient informed that his/her managed care company has contracts with or will negotiate with certain facilities, including the following:        Yes   Patient/family informed of bed offers received.  Patient chooses bed at [facility]     Physician recommends and patient chooses bed at      Patient to be transferred to [facility] on 03/23/16.  Patient to be transferred to facility by Ambulance     Patient family notified on   of transfer.  Name of family member notified:        PHYSICIAN Please sign FL2, Please prepare priority discharge summary, including  medications     Additional Comment:   Barbette Or, [place]

## 2016-03-23 DIAGNOSIS — I5021 Acute systolic (congestive) heart failure: Secondary | ICD-10-CM

## 2016-03-23 DIAGNOSIS — Z7901 Long term (current) use of anticoagulants: Secondary | ICD-10-CM

## 2016-03-23 DIAGNOSIS — D509 Iron deficiency anemia, unspecified: Secondary | ICD-10-CM

## 2016-03-23 LAB — BASIC METABOLIC PANEL
ANION GAP: 9 (ref 5–15)
BUN: 19 mg/dL (ref 6–20)
CO2: 26 mmol/L (ref 22–32)
Calcium: 8.6 mg/dL — ABNORMAL LOW (ref 8.9–10.3)
Chloride: 102 mmol/L (ref 101–111)
Creatinine, Ser: 1.15 mg/dL — ABNORMAL HIGH (ref 0.44–1.00)
GFR, EST AFRICAN AMERICAN: 52 mL/min — AB (ref >60)
GFR, EST NON AFRICAN AMERICAN: 45 mL/min — AB (ref >60)
GLUCOSE: 184 mg/dL — AB (ref 65–99)
POTASSIUM: 4.3 mmol/L (ref 3.5–5.1)
Sodium: 137 mmol/L (ref 135–145)

## 2016-03-23 LAB — CBC
HCT: 27.9 % — ABNORMAL LOW (ref 36.0–46.0)
HEMOGLOBIN: 8.4 g/dL — AB (ref 12.0–15.0)
MCH: 21.3 pg — AB (ref 26.0–34.0)
MCHC: 30.1 g/dL (ref 30.0–36.0)
MCV: 70.8 fL — AB (ref 78.0–100.0)
Platelets: 185 10*3/uL (ref 150–400)
RBC: 3.94 MIL/uL (ref 3.87–5.11)
RDW: 19.5 % — ABNORMAL HIGH (ref 11.5–15.5)
WBC: 8.8 10*3/uL (ref 4.0–10.5)

## 2016-03-23 LAB — GLUCOSE, CAPILLARY
GLUCOSE-CAPILLARY: 218 mg/dL — AB (ref 65–99)
Glucose-Capillary: 142 mg/dL — ABNORMAL HIGH (ref 65–99)

## 2016-03-23 LAB — PROTIME-INR
INR: 2.09
PROTHROMBIN TIME: 23.8 s — AB (ref 11.4–15.2)

## 2016-03-23 MED ORDER — OXYCODONE-ACETAMINOPHEN 5-325 MG PO TABS: 1.0000 | ORAL_TABLET | ORAL | 0 refills | Status: DC | PRN

## 2016-03-23 MED ORDER — FUROSEMIDE 40 MG PO TABS: 40.0000 mg | ORAL_TABLET | Freq: Two times a day (BID) | ORAL | 2 refills | Status: DC

## 2016-03-23 MED ORDER — POLYETHYLENE GLYCOL 3350 17 G PO PACK: 17.0000 g | PACK | Freq: Two times a day (BID) | ORAL | 0 refills | Status: AC

## 2016-03-23 MED ORDER — POLYSACCHARIDE IRON COMPLEX 150 MG PO CAPS: 150.0000 mg | ORAL_CAPSULE | Freq: Every day | ORAL | Status: AC

## 2016-03-23 MED ORDER — SENNOSIDES-DOCUSATE SODIUM 8.6-50 MG PO TABS: 1.0000 | ORAL_TABLET | Freq: Two times a day (BID) | ORAL | Status: AC

## 2016-03-23 MED ORDER — ASPIRIN 81 MG PO TBEC: 81.0000 mg | DELAYED_RELEASE_TABLET | Freq: Every day | ORAL | Status: AC

## 2016-03-23 MED ORDER — CARVEDILOL 6.25 MG PO TABS: 6.2500 mg | ORAL_TABLET | Freq: Two times a day (BID) | ORAL | 2 refills | Status: DC

## 2016-03-23 MED ORDER — ALPRAZOLAM 0.25 MG PO TABS: 0.2500 mg | ORAL_TABLET | Freq: Two times a day (BID) | ORAL | 0 refills | Status: DC | PRN

## 2016-03-23 MED ORDER — WARFARIN SODIUM 5 MG PO TABS: 6.0000 mg | ORAL_TABLET | Freq: Once | ORAL | Status: DC

## 2016-03-23 NOTE — Clinical Social Work Note (Signed)
Clinical Social Worker facilitated patient discharge including contacting patient family and facility to confirm patient discharge plans.  Clinical information faxed to facility and family agreeable with plan.  CSW arranged ambulance transport via [facility] to [facility].  RN to call report prior to discharge.  Clinical Social Worker will sign off for now as social work intervention is no longer needed. Please consult [place] again if new need arises.  Brett Darko B. Aldeen Riga,MSW, LCSWA Clinical Social Work Dept Weekend Social Worker [phone] 1:22 PM

## 2016-03-23 NOTE — Progress Notes (Signed)
[facility] for Warfarin Indication: atrial fibrillation  Allergies  Allergen Reactions  . Codeine Itching and Nausea And Vomiting  . Ativan [Lorazepam] Other (See Comments)    "Makes me confused and crazy"  . Ambien [Zolpidem] Other (See Comments)    "Has the opposite effect; makes me more alert/hyper"    Patient Measurements: Height: 5\' 5"  (165.1 cm) Weight: 156 lb 6.4 oz (70.9 kg) IBW/kg (Calculated) : 57  Height: 5'5" Weight (as of 03/05/2016): 64.4 kg   Vital Signs: Temp: 98.5 F (36.9 C) (03/10 0310) Temp Source: Oral (03/10 0310) BP: 119/58 (03/10 0828) Pulse Rate: 80 (03/10 0828)  Labs:  Recent Labs  03/21/16 0355 03/22/16 0452 03/23/16 0418  HGB 8.8* 8.2* 8.4*  HCT 29.4* 27.3* 27.9*  PLT 196 193 185  LABPROT 19.8* 23.1* 23.8*  INR 1.66 2.01 2.09  CREATININE 1.13* 1.04* 1.15*    Estimated Creatinine Clearance: 41.1 mL/min (by C-G formula based on SCr of 1.15 mg/dL (H)).   Medical History: Past Medical History:  Diagnosis Date  . Asthma   . CHF (congestive heart failure) ([place])   . COPD (chronic obstructive pulmonary disease) ([place])   . Diabetes mellitus without complication ([place])   . GERD (gastroesophageal reflux disease)   . Hx of right BKA ([place])   . Hypertension      Assessment: 75 y/oF on chronic warfarin therapy for atrial fibrillation who presents to [facility] ED on 03/18/2016 with weakness, shortness of breath, and left foot pain. Pharmacy consulted to manage warfarin dosing while patient in the hospital.  S/p angiogram with stent to left and right common iliac artery on 3/6.  Warfarin resumed 3/7, INR up to 2.09 today. Hgb stable today, no bleeding issues noted overnight. Back to home dose at discharge with INR now therapeutic.   Home warfarin dose reported as 4mg  alternating with 6mg  daily, with last dose of 4mg  taken on 03/17/2016.  Goal of Therapy:  INR 2-3 Monitor platelets by anticoagulation protocol: Yes    Plan Warfarin 6 mg x 1 tonight Daily INR for now  Uvaldo Bristle, PharmD PGY1 Pharmacy Resident Pager [phone] 03/23/2016 1:38 PM

## 2016-03-23 NOTE — Discharge Summary (Signed)
Physician Discharge Summary  Sarah Moss QPY:[id number] DOB: 06-18-1940 DOA: 03/18/2016  PCP: [name], PA-C  Admit date: 03/18/2016 Discharge date: 03/23/2016  Time spent: 65 minutes  Recommendations for Outpatient Follow-up:  1. Patient to be discharged to skilled nursing facility at [facility]. Patient will follow-up with M.D. at skilled nursing facility. Patient need a PT/INR checked on Monday, 03/25/2016. Patient also need a basic metabolic profile and a CBC done in 1 week to follow-up on electrolytes and renal function as well as H&H. 2. Patient is to follow-up with Dr. Donzetta Matters, vascular surgery in 1 month. 3. Patient is to follow-up with her cardiologist, Dr.Akbary in 2 weeks. On follow-up patient has new LV dysfunction and possible cardiomyopathy. Cardiologist will need to determine as to whether to repeat patient's echocardiogram and whether patient needs a cardiac catheterization.   Discharge Diagnoses:  Principal Problem:   Acute systolic heart failure (HCC) Active Problems:   Atrial fibrillation (HCC)   Hx of right BKA (HCC)   CHF (congestive heart failure) (HCC)   COPD (chronic obstructive pulmonary disease) (HCC)   Gastroesophageal reflux disease   PAD (peripheral artery disease) (HCC)   Cardiomyopathy ([place])   Diabetes mellitus with hemoglobin A1c goal of 7.0%-8.0% (HCC)   Chronic anticoagulation-Couamdin   Anemia   Discharge Condition: Stable and improved  Diet recommendation: Heart healthy  Filed Weights   03/21/16 0500 03/22/16 0343 03/23/16 0300  Weight: 70.9 kg (156 lb 3.2 oz) 70.7 kg (155 lb 14.4 oz) 70.9 kg (156 lb 6.4 oz)    History of present illness:  Sarah Moss 76 year old female with a history of HTN, DM, A-fib with tachy brady s/p pacemaker, hypothyroidism, diastolic HF, , COPD (not O2 dependent) and right BKA from an accident, PAD s/p stent in left leg (followed by vascular in [place]) presented with c/o weakness and shortness of breath. She  has had progressive shortness of breath that "comes and goes" and is worse with activity over the last 1 month. She denied any chest pain. She denied any palpitations or lightheadedness. She was most recently hospitalized and discharged from [facility] on 12/26/15 for CHF exacerbation. Most recent 2-D echo 10/02/15, showed low normal LV systolic function 12-45%, moderate MR and TR, moderate pulmonary hypertension, biatrial enlargement,  Patient also complained of left foot pain. Patient was recently evaluated by the vascular surgery on 03/08/16 Dr. Kathreen Cosier, [name], MD, [facility] .Exam indicated significantly abnormal PVR. Suggestted superficial femoral artery occlusion. Her left ABI is non-compressible, though her toe pressure of 60 is adequate for wound healing. Her left ankle waveform is pulsatile, but slightly dampened. Her symptoms right now are not consistent with ischemia rest pain and she has no tissue loss. Compression stockings were recommended. Angiogram was recommended, she would like to hold off on it. Patient was planned to have a 3 week follow-up with her vascular surgeon. ED course BP 131/62  Pulse 79  Temp 97.6 F (36.4 C) (Oral)  Resp 20  SpO2 98% Chest x-ray shows central mild vascular congestion without pulmonary edema Hemoglobin 9.8, MCV 69.0, creatinine 1.11, BNP 765.5, troponin 0.02, INR 2.44, UA negative, Patient evaluated for shortness of breath, left foot pain.   Hospital Course:  #1 acute on chronic CHF exacerbation Patient likely with acute on chronic diastolic heart failure presented with shortness of breath lower extremity edema. Patient was placed on IV Lasix and monitored. Patient improved clinically on a daily basis. Patient was subsequently transitioned from IV Lasix  to oral Lasix of 40 mg twice daily.   Patient with 2-D echo done on 09/30/2016. Repeat 2-D echo with EF of 20-25% with diffuse hypokinesis. Patient with a severely  reduced EF of 20-25% with diffuse hypokinesis. Patient was maintained on aspirin, beta blocker, statin, Cardizem, Lasix, lisinopril. Patient was seen in consultation by cardiology who recommended discontinuation of patient's Cardizem and patient's atenolol changed to Coreg. Patient was euvolemic by day of discharge. Patient is to follow-up with her cardiologist, Dr. [name] in 2 weeks for further evaluation and management.  #2 cardiomyopathy/LV Dysfunction Questionable etiology. Patient noted to have a cardiomyopathy with a EF of 20-25% with diffuse hypokinesis on 2-D echo from 03/20/2016 which is a change from prior echo on 09/30/2016 that had a normal EF with no wall motion abnormalities. Patient is a vasculopath and admitted with left foot pain status post aortogram and left leg angiogram and intervention with bilateral iliac artery stenting Sarah vascular surgery 03/19/2016 during this hospitalization. Patient denied any chest pain during the hospitalization. Patient with multiple risk factors. Concern for possible ischemic cardiomyopathy. Patient was maintained on aspirin, atenolol, Lipitor, Cardizem, Lasix, lisinopril, Coumadin. Consulted cardiology whom assessed the patient. Patient's Cardizem was discontinued and patient's atenolol was changed to Coreg Sarah cardiology recommendations. It was felt Sarah cardiology that potential contributors to her new LV dysfunction included coronary artery disease, pacemaker-induced cardiomyopathy. It was recommended that patient follow-up with her cardiologist, Dr. [name] in the outpatient setting for further evaluation and possible repeat echo and determination of whether or not cardiac catheterization is necessary. Patient remained in stable condition. Patient with discharge in stable condition.   #3 left foot pain/PAD Uric acid level elevated. ABIs with left ABI of 0.27 suggestive of critical disease at rest. Patient has been seen in consultation by vascular surgery  and patient s/p aortogram with left leg angiogram and intervention(bilateral iliac artery stenting) 03/19/2016 Sarah vascular surgery. Pain in left foot improved. ABIs on the left noncompressible. Patient was maintained on aspirin and a statin. Patient's Coumadin has been resumed postoperatively.. Patient will need to follow-up with vascular surgery in 4 weeks post discharge.   #4 history of atrial fibrillation Patient was initially maintained on Cardizem and atenolol for rate control. Due to patient's cardiomyopathy and LV dysfunction patient's Cardizem was discontinued and atenolol changed to Coreg by cardiology. Patient's anticoagulation was initially held during the hospitalization as patient was being assessed by vascular surgery and patient underwent bilateral iliac stenting. Cardizem was subsequently resumed. Patient's INR was therapeutic by day of discharge. Patient was discharged on home regimen of Coumadin. Patient will need a INR checked on Monday, 03/25/2016.    #5 COPD Stable. Patient was maintained on Dulera, singular, PPI.  #6 gastroesophageal reflux disease Patient was maintained on a PPI.  #7 diabetes mellitus Patient was maintained on sliding scale insulin throughout the hospitalization.   #8 hypothyroidism Continued on home dose Synthroid.  #9 hypertension Patient was maintained on ACE inhibitor, beta blocker, diuretics and calcium channel blocker during the hospitalization. Patient's calcium channel blocker was discontinued Sarah cardiology and patient's atenolol changed to Coreg. Blood pressure remained well-controlled. Outpatient follow-up.   #10 iron deficiency anemia H&H stable. Patient received 2 doses of IV iron during the hospitalization and placed on oral iron supplementation on discharge. Outpatient follow-up.   11 history of right BKA   Procedures:  ABIs 03/18/2016  2-D echo 03/20/2016  Chest x-ray 03/18/2016  Aortogram with left leg angiogram and  possible intervention pending 03/19/2016  2 units FFP 03/19/2016  Repeat ABIs 03/20/2016  Vein mapping 03/20/2016  Consultations:  Cardiology: Dr Marlou Porch 03/22/2016  Vascular Surgery: Dr Donzetta Matters 03/22/16  Discharge Exam: Vitals:   03/23/16 0310 03/23/16 0828  BP: 124/69 (!) 119/58  Pulse:  80  Resp: 18   Temp: 98.5 F (36.9 C)     General: NAD Cardiovascular: RRR WITH 3/6 SEM Respiratory: CTAB  Discharge Instructions   Discharge Instructions    Amb Referral to HF Clinic    Complete by:  As directed    Diet - low sodium heart healthy    Complete by:  As directed    Increase activity slowly    Complete by:  As directed      Current Discharge Medication List    START taking these medications   Details  aspirin EC 81 MG EC tablet Take 1 tablet (81 mg total) by mouth daily.    carvedilol (COREG) 6.25 MG tablet Take 1 tablet (6.25 mg total) by mouth 2 (two) times daily with a meal. Qty: 60 tablet, Refills: 2    iron polysaccharides (NIFEREX) 150 MG capsule Take 1 capsule (150 mg total) by mouth daily.    oxyCODONE-acetaminophen (PERCOCET/ROXICET) 5-325 MG tablet Take 1 tablet by mouth every 4 (four) hours as needed for severe pain. Qty: 20 tablet, Refills: 0    polyethylene glycol (MIRALAX / GLYCOLAX) packet Take 17 g by mouth 2 (two) times daily. Qty: 14 each, Refills: 0    senna-docusate (SENOKOT-S) 8.6-50 MG tablet Take 1 tablet by mouth 2 (two) times daily.      CONTINUE these medications which have CHANGED   Details  ALPRAZolam (XANAX) 0.25 MG tablet Take 1 tablet (0.25 mg total) by mouth every 12 (twelve) hours as needed for anxiety. Qty: 15 tablet, Refills: 0    furosemide (LASIX) 40 MG tablet Take 1 tablet (40 mg total) by mouth 2 (two) times daily. Qty: 60 tablet, Refills: 2      CONTINUE these medications which have NOT CHANGED   Details  ADVAIR DISKUS 250-50 MCG/DOSE AEPB Inhale 1 puff into the lungs 2 (two) times daily.    atorvastatin (LIPITOR)  40 MG tablet Take 40 mg by mouth daily.    DULoxetine (CYMBALTA) 60 MG capsule Take 60 mg by mouth daily.    gabapentin (NEURONTIN) 300 MG capsule Take 300 mg by mouth at bedtime.    levothyroxine (SYNTHROID, LEVOTHROID) 25 MCG tablet Take 25 mcg by mouth daily.    lisinopril (PRINIVIL,ZESTRIL) 20 MG tablet Take 20 mg by mouth daily.    metFORMIN (GLUCOPHAGE) 1000 MG tablet Take 1,000 mg by mouth 2 (two) times daily.    montelukast (SINGULAIR) 10 MG tablet Take 10 mg by mouth at bedtime.    nitroGLYCERIN (NITROSTAT) 0.4 MG SL tablet Place 0.4 mg under the tongue every 5 (five) minutes as needed for chest pain.    omeprazole (PRILOSEC) 40 MG capsule Take 40 mg by mouth daily.    potassium chloride (K-DUR) 10 MEQ tablet Take 10 mEq by mouth 2 (two) times daily. For 30 days    VENTOLIN HFA 108 (90 Base) MCG/ACT inhaler Inhale 1 puff into the lungs every 6 (six) hours as needed for shortness of breath.     warfarin (COUMADIN) 4 MG tablet Take 4-6 mg by mouth daily. Patient alternates between 4 mg and 6 mg daily. Patient had 4 mg on 03/17/16      STOP taking these medications     atenolol (  TENORMIN) 25 MG tablet      diltiazem (TIAZAC) 120 MG 24 hr capsule        Allergies  Allergen Reactions  . Codeine Itching and Nausea And Vomiting  . Ativan [Lorazepam] Other (See Comments)    "Makes me confused and crazy"  . Ambien [Zolpidem] Other (See Comments)    "Has the opposite effect; makes me more alert/hyper"   Follow-up Information    Servando Snare, MD Follow up in 1 month(s).   Specialties:  Vascular Surgery, Cardiology Why:  office will call for appoinment Contact information: [place] [address] [phone]        Mahala Menghini, MD. Schedule an appointment as soon as possible for a visit in 2 week(s).   Specialty:  Cardiology Why:  f/u in 2-3 weeks. Contact information: [address] [place] [address] [phone]        MD AT  SNF Follow up.            The results of significant diagnostics from this hospitalization (including imaging, microbiology, ancillary and laboratory) are listed below for reference.    Significant Diagnostic Studies: Dg Chest 2 View  Result Date: 03/18/2016 CLINICAL DATA:  Generalized weakness, shortness of breath for 2 days EXAM: CHEST  2 VIEW COMPARISON:  03/05/2016 FINDINGS: Cardiomegaly again noted. Dual lead cardiac pacemaker is unchanged in position. Central mild vascular congestion without convincing pulmonary edema. Stable trace bilateral pleural effusion with basilar atelectasis. Osteopenia and mild degenerative changes thoracic spine. Atherosclerotic calcifications of thoracic aorta. No segmental infiltrate. IMPRESSION: Central mild vascular congestion without convincing pulmonary edema. Stable trace bilateral pleural effusion with basilar atelectasis. No segmental infiltrates. Electronically Signed   By: Lahoma Crocker M.D.   On: 03/18/2016 11:10   Dg Chest 2 View  Result Date: 03/05/2016 CLINICAL DATA:  76 year old female with a 1 week history of shortness of breath and chest pain which is worse on exertion EXAM: CHEST  2 VIEW COMPARISON:  Prior chest x-ray 12/14/2014 FINDINGS: Interval placement of a left subclavian approach cardiac rhythm maintenance device compared to prior imaging. This is a 2 lead device with leads projecting over the right atrium and right ventricle. Stable mild cardiomegaly and mediastinal contours. Atherosclerotic calcifications present in the transverse aorta. No evidence of pulmonary edema, new focal airspace consolidation or pneumothorax. There are small bilateral pleural effusions. Stable chronic bronchitic change and mild interstitial prominence. No acute osseous abnormality. IMPRESSION: 1. Small bilateral pleural effusions which are new compared to 12/14/2014. 2. Stable cardiomegaly without pulmonary edema. 3.  Aortic Atherosclerosis (ICD10-170.0) Electronically  Signed   By: Jacqulynn Cadet M.D.   On: 03/05/2016 08:18    Microbiology: No results found for this or any previous visit (from the past 240 hour(s)).   Labs: Basic Metabolic Panel:  Recent Labs Lab 03/18/16 1132 03/19/16 0807 03/19/16 1024 03/20/16 0503 03/21/16 0355 03/22/16 0452 03/23/16 0418  NA 139  --  138 138 138 138 137  K 4.3  --  4.1 4.2 3.8 3.3* 4.3  CL 106  --  104 99* 103 102 102  CO2 22  --  23 22 23 25 26   GLUCOSE 137*  --  123* 127* 119* 134* 184*  BUN 27*  --  26* 26* 22* 17 19  CREATININE 1.11*  --  1.15* 1.19* 1.13* 1.04* 1.15*  CALCIUM 9.0  --  9.0 9.1 8.6* 8.2* 8.6*  MG 1.5* 1.7  --  1.7 2.1  --   --  Liver Function Tests:  Recent Labs Lab 03/18/16 1132 03/19/16 1024  AST 41 27  ALT 17 15  ALKPHOS 75 70  BILITOT 1.0 1.2  PROT 7.5 6.6  ALBUMIN 3.8 3.3*   No results for input(s): LIPASE, AMYLASE in the last 168 hours. No results for input(s): AMMONIA in the last 168 hours. CBC:  Recent Labs Lab 03/18/16 1132 03/19/16 1024 03/20/16 0503 03/21/16 0355 03/22/16 0452 03/23/16 0418  WBC 8.0 5.7 7.5 7.6 8.6 8.8  NEUTROABS 5.0  --   --   --   --   --   HGB 9.8* 9.8* 9.2* 8.8* 8.2* 8.4*  HCT 31.9* 31.8* 31.2* 29.4* 27.3* 27.9*  MCV 69.0* 69.0* 69.6* 69.5* 68.9* 70.8*  PLT 265 221 235 196 193 185   Cardiac Enzymes: No results for input(s): CKTOTAL, CKMB, CKMBINDEX, TROPONINI in the last 168 hours. BNP: BNP (last 3 results)  Recent Labs  03/05/16 0800 03/18/16 1132  BNP 842.0* 765.5*    ProBNP (last 3 results) No results for input(s): PROBNP in the last 8760 hours.  CBG:  Recent Labs Lab 03/22/16 1241 03/22/16 1625 03/22/16 2117 03/23/16 0809 03/23/16 1122  GLUCAP 167* 150* 177* 142* 218*       Signed:  THOMPSON,DANIEL MD.  [facility] 03/23/2016, 11:43 AM

## 2016-03-23 NOTE — Clinical Social Work Placement (Signed)
   CLINICAL SOCIAL WORK PLACEMENT  NOTE  Date:  03/23/2016  Patient Details  Name: Sarah Moss MRN: [id number] Date of Birth: Sep 09, 1940  Clinical Social Work is seeking post-discharge placement for this patient at the [place] level of care (*CSW will initial, date and re-position this form in  chart as items are completed):  Yes   Patient/family provided with [facility] Work Department's list of facilities offering this level of care within the geographic area requested by the patient (or if unable, by the patient's family).  Yes   Patient/family informed of their freedom to choose among providers that offer the needed level of care, that participate in Medicare, Medicaid or managed care program needed by the patient, have an available bed and are willing to accept the patient.  Yes   Patient/family informed of [facility]'s ownership interest in [facility] and [facility], as well as of the fact that they are under no obligation to receive care at these facilities.  PASRR submitted to EDS on       PASRR number received on       Existing PASRR number confirmed on 03/21/16     FL2 transmitted to all facilities in geographic area requested by pt/family on 03/21/16     FL2 transmitted to all facilities within larger geographic area on       Patient informed that his/her managed care company has contracts with or will negotiate with certain facilities, including the following:        Yes   Patient/family informed of bed offers received.  Patient chooses bed at [facility]     Physician recommends and patient chooses bed at      Patient to be transferred to [facility] on 03/23/16.  Patient to be transferred to facility by Ambulance     Patient family notified on   of transfer.  Name of family member notified:        PHYSICIAN Please sign FL2, Please prepare priority discharge summary, including  medications     Additional Comment:    _______________________________________________ Serafina Mitchell, LCSWA 03/23/2016, 1:22 PM

## 2016-03-23 NOTE — Progress Notes (Signed)
Patient Name: Sarah Moss      SUBJECTIVE: 76 year old woman admitted for lower extremity revascularization. Apparently there were issues with poorly healing wounds. Postoperatively, she had complaints of shortness of breath. An echocardiogram was done demonstrating new LV dysfunction 20%<<---50-55% from 9/17 The timing of events is not at all clear to me from the medical record. When seen by cardiology yesterday the patient was demented and disoriented. Diltiazem was changed to carvedilol with the anticipation that her primary cardiologist could direct further care. Reviewing notes from the nursing navigator, wishes were expressed about transferring care to [facility] and/or [facility]  Understands she is to go to SNF today.  She is aware of where she is. She is aware of the procedures.   Denies shortness of breath or chest pain. She still has left hip pain. Her sister says her left foot is warmer    Past Medical History:  Diagnosis Date  . Asthma   . CHF (congestive heart failure) ([place])   . COPD (chronic obstructive pulmonary disease) ([place])   . Diabetes mellitus without complication ([place])   . GERD (gastroesophageal reflux disease)   . Hx of right BKA ([place])   . Hypertension     Scheduled Meds:  Scheduled Meds: . sodium chloride   Intravenous Once  . aspirin EC  81 mg Oral Daily  . atorvastatin  40 mg Oral q1800  . carvedilol  6.25 mg Oral BID WC  . DULoxetine  60 mg Oral Daily  . furosemide  40 mg Oral BID  . gabapentin  300 mg Oral QHS  . insulin aspart  0-9 Units Subcutaneous TID WC  . iron polysaccharides  150 mg Oral Daily  . levothyroxine  25 mcg Oral QAC breakfast  . lisinopril  20 mg Oral Daily  . mometasone-formoterol  2 puff Inhalation BID  . montelukast  10 mg Oral QHS  . pantoprazole  40 mg Oral Daily  . polyethylene glycol  17 g Oral BID  . potassium chloride  40 mEq Oral Daily  . senna-docusate  1 tablet Oral BID  . sodium chloride flush   3 mL Intravenous Q12H  . Warfarin - Pharmacist Dosing Inpatient   Does not apply q1800   Continuous Infusions: . sodium chloride 10 mL/hr at 03/19/16 2241   sodium chloride, sodium chloride, acetaminophen **OR** acetaminophen, albuterol, ALPRAZolam, alum & mag hydroxide-simeth, guaiFENesin-dextromethorphan, hydrALAZINE, labetalol, metoprolol, ondansetron, oxyCODONE-acetaminophen, phenol, sodium chloride flush, sorbitol    PHYSICAL EXAM Vitals:   03/22/16 2046 03/23/16 0300 03/23/16 0310 03/23/16 0828  BP: (!) 117/53  124/69 (!) 119/58  Pulse:    80  Resp:   18   Temp:   98.5 F (36.9 C)   TempSrc:   Oral   SpO2:   98%   Weight:  156 lb 6.4 oz (70.9 kg)    Height:       Well developed and nourished in no acute distress HENT normal Neck supple   Clear Regular rate and rhythm, no murmurs or gallops Abd-soft with active BS No Clubbing cyanosis n edema  R BKA L foot ruddy but warm Skin-warm and dry A & Oriented  Grossly normal sensory and motor function   TELEMETRY: Reviewed personnally pt in P-synchronous/ AV  pacing  ECG personally reviewed P-synchronous/ AV  pacing     Intake/Output Summary (Last 24 hours) at 03/23/16 0929 Last data filed at 03/23/16 0901  Gross per 24 hour  Intake  240 ml  Output                0 ml  Net              240 ml    LABS: Basic Metabolic Panel:  Recent Labs Lab 03/18/16 1132  03/19/16 1024 03/20/16 0503 03/21/16 0355 03/22/16 0452 03/23/16 0418  NA 139  --  138 138 138 138 137  K 4.3  --  4.1 4.2 3.8 3.3* 4.3  CL 106  --  104 99* 103 102 102  CO2 22  --  23 22 23 25 26   GLUCOSE 137*  --  123* 127* 119* 134* 184*  BUN 27*  --  26* 26* 22* 17 19  CREATININE 1.11*  --  1.15* 1.19* 1.13* 1.04* 1.15*  CALCIUM 9.0  --  9.0 9.1 8.6* 8.2* 8.6*  MG 1.5*  < >  --  1.7 2.1  --   --   < > = values in this interval not displayed. Cardiac Enzymes: No results for input(s): CKTOTAL, CKMB, CKMBINDEX, TROPONINI in the last 72  hours. CBC:  Recent Labs Lab 03/18/16 1132 03/19/16 1024 03/20/16 0503 03/21/16 0355 03/22/16 0452 03/23/16 0418  WBC 8.0 5.7 7.5 7.6 8.6 8.8  NEUTROABS 5.0  --   --   --   --   --   HGB 9.8* 9.8* 9.2* 8.8* 8.2* 8.4*  HCT 31.9* 31.8* 31.2* 29.4* 27.3* 27.9*  MCV 69.0* 69.0* 69.6* 69.5* 68.9* 70.8*  PLT 265 221 235 196 193 185   PROTIME:  Recent Labs  03/21/16 0355 03/22/16 0452 03/23/16 0418  LABPROT 19.8* 23.1* 23.8*  INR 1.66 2.01 2.09   Liver Function Tests: No results for input(s): AST, ALT, ALKPHOS, BILITOT, PROT, ALBUMIN in the last 72 hours. No results for input(s): LIPASE, AMYLASE in the last 72 hours. BNP: BNP (last 3 results)  Recent Labs  03/05/16 0800 03/18/16 1132  BNP 842.0* 765.5*      ASSESSMENT AND PLAN:  Principal Problem:   Acute systolic heart failure (HCC) Active Problems:   Atrial fibrillation (HCC)   Hx of right BKA (HCC)   CHF (congestive heart failure) (HCC)   COPD (chronic obstructive pulmonary disease) (HCC)   Gastroesophageal reflux disease   PAD (peripheral artery disease) (HCC)   Cardiomyopathy ([place])   Diabetes mellitus with hemoglobin A1c goal of 7.0%-8.0% (HCC)   Chronic anticoagulation-Couamdin   Anemia  She has new LV dysfunction. Potential contributors include coronary artery disease, pacemaker induced cardiomyopathy. These can be addressed by her primary cardiologist as an outpatient.   Symptomatically, she is stable. She is on diuretics carvedilol and ACE inhibitor. From our point of view she can be discharged. We are available as needed.  Signed, Virl Axe MD  03/23/2016

## 2016-03-23 NOTE — Progress Notes (Signed)
Pt report given to [facility] RN Suanne Marker.

## 2016-04-02 ENCOUNTER — Ambulatory Visit (INDEPENDENT_AMBULATORY_CARE_PROVIDER_SITE_OTHER): Payer: Medicare HMO | Admitting: Physician Assistant

## 2016-04-02 ENCOUNTER — Encounter: Payer: Self-pay | Admitting: Physician Assistant

## 2016-04-02 VITALS — HR 81 | Ht 65.0 in

## 2016-04-02 DIAGNOSIS — N183 Chronic kidney disease, stage 3 unspecified: Secondary | ICD-10-CM

## 2016-04-02 DIAGNOSIS — I5043 Acute on chronic combined systolic (congestive) and diastolic (congestive) heart failure: Secondary | ICD-10-CM

## 2016-04-02 DIAGNOSIS — I442 Atrioventricular block, complete: Secondary | ICD-10-CM | POA: Diagnosis not present

## 2016-04-02 DIAGNOSIS — I481 Persistent atrial fibrillation: Secondary | ICD-10-CM

## 2016-04-02 DIAGNOSIS — I5023 Acute on chronic systolic (congestive) heart failure: Secondary | ICD-10-CM

## 2016-04-02 DIAGNOSIS — I739 Peripheral vascular disease, unspecified: Secondary | ICD-10-CM

## 2016-04-02 DIAGNOSIS — Z7901 Long term (current) use of anticoagulants: Secondary | ICD-10-CM | POA: Diagnosis not present

## 2016-04-02 DIAGNOSIS — I4811 Longstanding persistent atrial fibrillation: Secondary | ICD-10-CM

## 2016-04-02 LAB — BASIC METABOLIC PANEL
BUN: 20 mg/dL (ref 7–25)
CHLORIDE: 104 mmol/L (ref 98–110)
CO2: 28 mmol/L (ref 20–31)
Calcium: 9.3 mg/dL (ref 8.6–10.4)
Creat: 1.2 mg/dL — ABNORMAL HIGH (ref 0.60–0.93)
GLUCOSE: 112 mg/dL — AB (ref 65–99)
POTASSIUM: 5.5 mmol/L — AB (ref 3.5–5.3)
SODIUM: 143 mmol/L (ref 135–146)

## 2016-04-02 MED ORDER — FUROSEMIDE 40 MG PO TABS: 40.0000 mg | ORAL_TABLET | ORAL | Status: DC

## 2016-04-02 NOTE — Progress Notes (Signed)
Cardiology Office Note   Date:  04/02/2016   ID:  Sarah Moss, DOB [birth year], MRN [id number]  PCP:  [name], PA-C  Cardiologist:  Dr. Luther Parody Electrophysiologist: Dr. Suzie Portela, PA-C   Chief Complaint  Patient presents with  . New Evaluation    post hospital, pt co DOE    History of Present Illness: Sarah Moss is a 76 y.o. female with a history of HTN, DM, A-fib with tachy brady on Coumadin, s/p St Jude pacemaker Dec 4098,[id number], diastolic HF, COPD (not O2 dependent), s/p right BKA from an accident, and PAD s/p prior Lt SFA stenting and stent to her LCIA and her RCIA 12/2015,  (followed by vascular in [place]). R renal stent placed 2006 by Dr Donnetta Hutching.  Admit 03/18/2016-03/23/2016 for CHF w/ EF now 20%, atrial fib, LE pain>>bilateral iliac stents, f/u with PV MD in [place]. DC to SNF. Unknown if NICM or ICM, f/u as outpatient, atenolol and Cardizem changed to [place] presents for post-hospital follow up. Her sister is here with her.   She is getting physical therapy at the SNF.  Her LLE is very edematous, she has developed a blister on her R foot and had a wrap placed today. Her RLE stump is so swollen she cannot get her prosthesis on.   She has DOE, she is unable to get from the bedroom to the bathroom without getting sob. She now has a bedside commode. She also gets out of breath getting from the bed to the chair.   Her sister notes that pt has had increased Lasix dosing before and does better with this.    Past Medical History:  Diagnosis Date  . Asthma   . CHF (congestive heart failure) ([place])   . COPD (chronic obstructive pulmonary disease) ([place])   . Diabetes mellitus without complication ([place])   . GERD (gastroesophageal reflux disease)   . Hx of right BKA ([place])   . Hypertension     Past Surgical History:  Procedure Laterality Date  . LOWER EXTREMITY ANGIOGRAPHY N/A 03/19/2016   Procedure: Lower Extremity Angiography;   Surgeon: Serafina [place], MD;  Location: [facility];  Service: Cardiovascular;  Laterality: N/A;  . PERIPHERAL VASCULAR INTERVENTION Bilateral 03/19/2016   Procedure: Peripheral Vascular Intervention;  Surgeon: Serafina [place], MD;  Location: [facility];  Service: Cardiovascular;  Laterality: Bilateral;  iliacs    Current Outpatient Prescriptions  Medication Sig Dispense Refill  . Cholecalciferol (D3-50) 50000 units capsule Take 50,000 Units by mouth daily.    Marland Kitchen ADVAIR DISKUS 250-50 MCG/DOSE AEPB Inhale 1 puff into the lungs 2 (two) times daily.    Marland Kitchen ALPRAZolam (XANAX) 0.25 MG tablet Take 1 tablet (0.25 mg total) by mouth every 12 (twelve) hours as needed for anxiety. 15 tablet 0  . aspirin EC 81 MG EC tablet Take 1 tablet (81 mg total) by mouth daily.    Marland Kitchen atorvastatin (LIPITOR) 40 MG tablet Take 40 mg by mouth daily.    . carvedilol (COREG) 6.25 MG tablet Take 1 tablet (6.25 mg total) by mouth 2 (two) times daily with a meal. 60 tablet 2  . DULoxetine (CYMBALTA) 60 MG capsule Take 60 mg by mouth daily.    . furosemide (LASIX) 40 MG tablet Take 1 tablet (40 mg total) by mouth 2 (two) times daily. 60 tablet 2  . gabapentin (NEURONTIN) 300 MG capsule Take 300 mg by mouth at bedtime.    Marland Kitchen  iron polysaccharides (NIFEREX) 150 MG capsule Take 1 capsule (150 mg total) by mouth daily.    Marland Kitchen levothyroxine (SYNTHROID, LEVOTHROID) 25 MCG tablet Take 25 mcg by mouth daily.    Marland Kitchen lisinopril (PRINIVIL,ZESTRIL) 20 MG tablet Take 20 mg by mouth daily.    . metFORMIN (GLUCOPHAGE) 1000 MG tablet Take 1,000 mg by mouth 2 (two) times daily.    . montelukast (SINGULAIR) 10 MG tablet Take 10 mg by mouth at bedtime.    . nitroGLYCERIN (NITROSTAT) 0.4 MG SL tablet Place 0.4 mg under the tongue every 5 (five) minutes as needed for chest pain.    Marland Kitchen omeprazole (PRILOSEC) 40 MG capsule Take 40 mg by mouth daily.    Marland Kitchen oxyCODONE-acetaminophen (PERCOCET/ROXICET) 5-325 MG tablet Take 1 tablet by mouth every 4  (four) hours as needed for severe pain. 20 tablet 0  . polyethylene glycol (MIRALAX / GLYCOLAX) packet Take 17 g by mouth 2 (two) times daily. 14 each 0  . potassium chloride (K-DUR) 10 MEQ tablet Take 10 mEq by mouth 2 (two) times daily. For 30 days    . senna-docusate (SENOKOT-S) 8.6-50 MG tablet Take 1 tablet by mouth 2 (two) times daily.    . VENTOLIN HFA 108 (90 Base) MCG/ACT inhaler Inhale 1 puff into the lungs every 6 (six) hours as needed for shortness of breath.     . warfarin (COUMADIN) 4 MG tablet Take 4-6 mg by mouth daily. Patient alternates between 4 mg and 6 mg daily. Patient had 4 mg on 03/17/16     No current facility-administered medications for this visit.     Allergies:   Codeine; Ativan [lorazepam]; and Ambien [zolpidem]    Social History:  The patient  reports that she has quit smoking. She has never used smokeless tobacco.   Family History:  The patient's family history includes Cancer in her father; Diabetes in her mother; Stroke in her mother.    ROS:  Please see the history of present illness. All other systems are reviewed and negative.    PHYSICAL EXAM: VS:  Pulse 81   Ht 5\' 5"  (1.651 m)  , BMI There is no height or weight on file to calculate BMI. GEN: Well nourished, well developed, female in no acute distress  HEENT: normal for age  Neck: JVD 11 cm, no carotid bruit, no masses Cardiac: RRR; 2/6 murmur, no rubs, or gallops Respiratory: Decreased breath sounds bases bilaterally, few rales, normal work of breathing GI: soft, nontender, nondistended, + BS MS: no deformity or atrophy; 1-2 bilateral lower extremity+ edema; distal pulses are 2+ in upper extremities. s/p R BKA. LLE with Unna boot in place, not disturbed  Skin: warm and dry, no rash Neuro:  Strength and sensation are intact Psych: euthymic mood, full affect   EKG:  EKG is ordered today. The ekg ordered today demonstrates Afib, V pacing   Recent Labs: 03/18/2016: B Natriuretic Peptide 765.5;  TSH 4.251 03/19/2016: ALT 15 03/21/2016: Magnesium 2.1 03/23/2016: BUN 19; Creatinine, Ser 1.15; Hemoglobin 8.4; Platelets 185; Potassium 4.3; Sodium 137    Lipid Panel No results found for: CHOL, TRIG, HDL, CHOLHDL, VLDL, LDLCALC, LDLDIRECT   Wt Readings from Last 3 Encounters:  03/23/16 156 lb 6.4 oz (70.9 kg)  03/05/16 142 lb (64.4 kg)     Other studies Reviewed: Additional studies/ records that were reviewed today include: office notes, hospital records and testing.   ASSESSMENT AND PLAN:  1.  PAD: Patient states she has not had any  problems with the sites from her recent bilateral iliac stents. She has a follow-up appointment with [facility] scheduled. She is on aspirin but not dual antiplatelet therapy because of the Coumadin.  2. Acute on chronic systolic CHF: Her EF was 58% at her recent hospitalization. She has volume overload by symptoms and by exam. She has a single kidney and a stent in that side was placed in 2006. I will increase her Lasix from 40 mg twice a day to 80 mg a.m. and 40 mg p.m. for 3 days. I will increase her potassium for those 3 days as well. She will get a BMET now and follow-up in 2 weeks with a BMET then.   3. Atrial fibrillation: She is in atrial fib with ventricular pacing. According to the interrogation, she has been in atrial fib continuously. Her device is currently in DDI mode. Dr. Sallyanne Kuster reviewed the interrogation. She will be set up with [facility] said that transmissions can be performed from home or from the facility. She will follow-up with Dr. Caryl Comes.  4. Chronic anticoagulation: She is on Coumadin. This is followed by the facility. Because of her stent, she is on aspirin as well. She has no bleeding issues.  5. Social: She is currently at [facility] in [place]. The plan is for her to stay in [place] when she is released from there. She wishes to transfer to a cardiologist in [place] and also wishes to get a PCP here plus continue with [facility] here. I  suggested to her sister that they go ahead and start the process of getting her referred to a PCP.   She will be followed by Dr. Marlou Porch and by Dr. Caryl Comes here.  6. CKD3: She has a history of renal insufficiency, her GFR was 45 on discharge from the hospital with a BUN of 19 and creatinine of 1.15. Recheck today and follow closely with diuresis. She's has a solitary kidney and that kidney has a stent.  Current medicines are reviewed at length with the patient today.  The patient does not have concerns regarding medicines.  The following changes have been made:  Increase Lasix and potassium temporarily  Labs/ tests ordered today include:   Orders Placed This Encounter  Procedures  . Basic Metabolic Panel (BMET)  . EKG 12-Lead     Disposition:   FU with Dr. Marlou Porch and Dr. Caryl Comes  Signed, Armari Fussell, [place], PA-C  04/02/2016 10:21 AM    [place] Phone: [phone]; Fax: [phone]  This note was written with the assistance of speech recognition software. Please excuse any transcriptional errors.

## 2016-04-02 NOTE — Patient Instructions (Addendum)
Medication Instructions:  1. INCREASE LASIX TO 80 MG IN THE MORNING AND 40 MG IN THE EVENING FOR 3 DAYS; AFTER THE 3 DAYS YOU WILL RESUME LASIX 40 MG TWICE DAILY  Labwork: 1. TODAY BMET  2. YOU WILL NEED A REPEAT  BMET AT YOUR FOLLOW UP APPT IN 2-3 WEEKS   Testing/Procedures: NONE ORDERED   Follow-Up: 1. YOU WILL NEED TO FOLLOW UP WITH DR. Marlou Porch AT [facility] IN 2-3 WEEKS; IF DR. Marlou Porch NOT AVAILABLE THEN WITH ONCE OF HIS CARE TEAM MEMMBERS. THE [facility] ST OFFICE IS :  [address] , [place], [place], [place] [address]; PHONE NUMBER IS [phone]  2. YOU WILL NEED A FOLLOW UP WITH DR. Caryl Comes WHO YOU SAW IN THE HOSPITAL. THIS APPT WILL NEED TO  BE IN 3 MONTHS   Any Other Special Instructions Will Be Listed Below (If Applicable). If you need a refill on your cardiac medications before your next appointment, please call your pharmacy.  YOU HAVE BEEN ADVISED TO FOLLOW A LOW SALT DIET  Low-Sodium Eating Plan Sodium, which is an element that makes up salt, helps you maintain a healthy balance of fluids in your body. Too much sodium can increase your blood pressure and cause fluid and waste to be held in your body. Your health care provider or dietitian may recommend following this plan if you have high blood pressure (hypertension), kidney disease, liver disease, or heart failure. Eating less sodium can help lower your blood pressure, reduce swelling, and protect your heart, liver, and kidneys. What are tips for following this plan? General guidelines   Most people on this plan should limit their sodium intake to 1,500-2,000 mg (milligrams) of sodium each day. Reading food labels   The Nutrition Facts label lists the amount of sodium in one serving of the food. If you eat more than one serving, you must multiply the listed amount of sodium by the number of servings.  Choose foods with less than 140 mg of sodium per serving.  Avoid foods with 300 mg of sodium or more per  serving. Shopping   Look for lower-sodium products, often labeled as "low-sodium" or "no salt added."  Always check the sodium content even if foods are labeled as "unsalted" or "no salt added".  Buy fresh foods.  Avoid canned foods and premade or frozen meals.  Avoid canned, cured, or processed meats  Buy breads that have less than 80 mg of sodium per slice. Cooking   Eat more home-cooked food and less restaurant, buffet, and fast food.  Avoid adding salt when cooking. Use salt-free seasonings or herbs instead of table salt or sea salt. Check with your health care provider or pharmacist before using salt substitutes.  Cook with plant-based oils, such as canola, sunflower, or olive oil. Meal planning   When eating at a restaurant, ask that your food be prepared with less salt or no salt, if possible.  Avoid foods that contain MSG (monosodium glutamate). MSG is sometimes added to [place] food, bouillon, and some canned foods. What foods are recommended? The items listed may not be a complete list. Talk with your dietitian about what dietary choices are best for you. Grains  Low-sodium cereals, including oats, puffed wheat and rice, and shredded wheat. Low-sodium crackers. Unsalted rice. Unsalted pasta. Low-sodium bread. Whole-grain breads and whole-grain pasta. Vegetables  Fresh or frozen vegetables. "No salt added" canned vegetables. "No salt added" tomato sauce and paste. Low-sodium or reduced-sodium tomato and  vegetable juice. Fruits  Fresh, frozen, or canned fruit. Fruit juice. Meats and other protein foods  Fresh or frozen (no salt added) meat, poultry, seafood, and fish. Low-sodium canned tuna and salmon. Unsalted nuts. Dried peas, beans, and lentils without added salt. Unsalted canned beans. Eggs. Unsalted nut butters. Dairy  Milk. Soy milk. Cheese that is naturally low in sodium, such as ricotta cheese, fresh mozzarella, or Swiss cheese Low-sodium or reduced-sodium cheese.  Cream cheese. Yogurt. Fats and oils  Unsalted butter. Unsalted margarine with no trans fat. Vegetable oils such as canola or olive oils. Seasonings and other foods  Fresh and dried herbs and spices. Salt-free seasonings. Low-sodium mustard and ketchup. Sodium-free salad dressing. Sodium-free light mayonnaise. Fresh or refrigerated horseradish. Lemon juice. Vinegar. Homemade, reduced-sodium, or low-sodium soups. Unsalted popcorn and pretzels. Low-salt or salt-free chips. What foods are not recommended? The items listed may not be a complete list. Talk with your dietitian about what dietary choices are best for you. Grains  Instant hot cereals. Bread stuffing, pancake, and biscuit mixes. Croutons. Seasoned rice or pasta mixes. Noodle soup cups. Boxed or frozen macaroni and cheese. Regular salted crackers. Self-rising flour. Vegetables  Sauerkraut, pickled vegetables, and relishes. Olives. [place] fries. Onion rings. Regular canned vegetables (not low-sodium or reduced-sodium). Regular canned tomato sauce and paste (not low-sodium or reduced-sodium). Regular tomato and vegetable juice (not low-sodium or reduced-sodium). Frozen vegetables in sauces. Meats and other protein foods  Meat or fish that is salted, canned, smoked, spiced, or pickled. Bacon, ham, sausage, hotdogs, corned beef, chipped beef, packaged lunch meats, salt pork, jerky, pickled herring, anchovies, regular canned tuna, sardines, salted nuts. Dairy  Processed cheese and cheese spreads. Cheese curds. Blue cheese. Feta cheese. String cheese. Regular cottage cheese. Buttermilk. Canned milk. Fats and oils  Salted butter. Regular margarine. Ghee. Bacon fat. Seasonings and other foods  Onion salt, garlic salt, seasoned salt, table salt, and sea salt. Canned and packaged gravies. Worcestershire sauce. Tartar sauce. Barbecue sauce. Teriyaki sauce. Soy sauce, including reduced-sodium. Steak sauce. Fish sauce. Oyster sauce. Cocktail sauce.  Horseradish that you find on the shelf. Regular ketchup and mustard. Meat flavorings and tenderizers. Bouillon cubes. Hot sauce and Tabasco sauce. Premade or packaged marinades. Premade or packaged taco seasonings. Relishes. Regular salad dressings. Salsa. Potato and tortilla chips. Corn chips and puffs. Salted popcorn and pretzels. Canned or dried soups. Pizza. Frozen entrees and pot pies. Summary  Eating less sodium can help lower your blood pressure, reduce swelling, and protect your heart, liver, and kidneys.  Most people on this plan should limit their sodium intake to 1,500-2,000 mg (milligrams) of sodium each day.  Canned, boxed, and frozen foods are high in sodium. Restaurant foods, fast foods, and pizza are also very high in sodium. You also get sodium by adding salt to food.  Try to cook at home, eat more fresh fruits and vegetables, and eat less fast food, canned, processed, or prepared foods. This information is not intended to replace advice given to you by your health care provider. Make sure you discuss any questions you have with your health care provider. Document Released: 06/22/2001 Document Revised: 12/25/2015 Document Reviewed: 12/25/2015 Elsevier Interactive Patient Education  2017 [employer].

## 2016-04-02 NOTE — Progress Notes (Signed)
Pacemaker interrogation performed for new patient seen today by Rosaria Ferries, PA. [place] 2240 pacemaker implanted December 2016, notes state the patient is dependent following AV node ablation performed in February 2017. This is the first pacemaker interrogation performed since 01/01/2016. Normal battery status with expected longevity 7.3-8.5 years. The device is programmed DDI with a base rate of 80 bpm. Counters show 14% atrial pacing and virtually 100% ventricular pacing, virtually 100% atrial fibrillation burden over the last 12 months. Active threshold testing was not performed today. No significant high ventricular rates seen. The patient will follow up with Dr. Marlou Porch and Dr. Caryl Comes at the [facility] office. Suggested changes include switching to VVI mode and lowering the base rate 70 bpm. Also need active retesting of pacing thresholds. Since she will follow-up with Dr. Caryl Comes, no active changes are made today. Will make sure she is enrolled in device clinic remote checkups via [place]. She does not currently have a transmitter and will ask [employer] to provide this. Impressed upon patient and family that she is pacemaker dependent and careful monitoring is mandatory.  Sanda Klein, MD, [facility] [phone] office [phone] pager

## 2016-04-04 ENCOUNTER — Other Ambulatory Visit: Payer: Self-pay | Admitting: *Deleted

## 2016-04-04 NOTE — Patient Outreach (Signed)
[facility]) Care Management  04/04/2016  AZUREE MINISH 11/14/1940 [id number]   Met with patient daughter, Otila Kluver at facility.  She explained that she is in process of applying for Medicaid for patient to remain LTC.  Patient had been living in her own home until January when she moved in with patient sister. Patient has since declined and needs increased care due to failing health and memory and increased care needs.   Met with Melissa, SW at facility, she states that she is assisting family with LTC placement and Medicaid application.   Plan to sign off as noTHN Care management needs due to patient to remain LTC at facility.  Royetta Crochet. Laymond Purser, RN, BSN, [place] [phone]) Business Cell  [phone]) Toll Free Office

## 2016-04-05 ENCOUNTER — Other Ambulatory Visit: Payer: Self-pay | Admitting: Cardiovascular Disease

## 2016-04-05 ENCOUNTER — Telehealth: Payer: Self-pay | Admitting: Physician Assistant

## 2016-04-05 NOTE — Telephone Encounter (Signed)
Patient's sister Beverlee Nims (DPR on file) notified of lab results and recommendations to stop potassium. She states that her sister has lost some fluid and in her opinion looks a lot better. It was instructed that she keep her follow up appointment with Dr. Marlou Porch on 04/29/16 at 4:20 PM. Patient's sister asked that we call [facility] and let the nurse working there of these recommendations.   [place] at [phone] and spoke to Tye (patient's nurse at [facility])) and told her that per Rosaria Ferries, PA/Dr. Marlou Porch that the patient needs to stop taking her potassium since it was elevated at 5.5 and she needs to resume her normal dose of furosemide at 40 mg BID. Tye (patient's nurse at [facility]) was also told that since the patient was doing well that she should keep her appointment with Dr. Marlou Porch on 04/29/16 at 4:20 PM. Ranelle Oyster (patient's nurse at [facility]) verbalized understanding.

## 2016-04-05 NOTE — Telephone Encounter (Signed)
New Message     Leave complete message on cell phone with test results and fax nursing home the results also

## 2016-04-05 NOTE — Telephone Encounter (Signed)
-----   Message from Lonn Georgia, PA-C sent at 04/05/2016 12:12 PM EDT ----- Pt of Dr Marlou Porch Please ask her to stop her potassium and make sure she has lost some fluid. If not, will need to continue higher dose Lasix.  If she is doing well, keep f/u appt. If not doing better, pls schedule APP appt next week. Thanks

## 2016-04-09 ENCOUNTER — Encounter: Payer: Self-pay | Admitting: Vascular Surgery

## 2016-04-12 ENCOUNTER — Encounter: Payer: Self-pay | Admitting: *Deleted

## 2016-04-19 ENCOUNTER — Ambulatory Visit (INDEPENDENT_AMBULATORY_CARE_PROVIDER_SITE_OTHER): Payer: Self-pay | Admitting: Vascular Surgery

## 2016-04-19 ENCOUNTER — Encounter: Payer: Self-pay | Admitting: Vascular Surgery

## 2016-04-19 VITALS — BP 132/67 | HR 81 | Temp 98.8°F | Resp 16 | Ht 65.0 in | Wt 154.0 lb

## 2016-04-19 DIAGNOSIS — I739 Peripheral vascular disease, unspecified: Secondary | ICD-10-CM

## 2016-04-19 NOTE — Progress Notes (Signed)
Subjective:     Patient ID: YARIMA PENMAN, female   DOB: 1940-08-26, 76 y.o.   MRN: [id number]  HPI 76 year old female follows up from recent bilateral common iliac artery stenting. This was done for pain. Unfortunately she has had increasing swelling of her left leg as well as her right residual limb. This is cause a wound on the dorsum of her left foot. She has a known occlusion of her left SFA and was considered to need possible bypass should she have further issues. She is not having fevers or chills. She is currently living in a snf is not having other issues.   Review of Systems No fevers or chills     Objective:   Physical Exam Awake and alert Non labored respirations 5.5 x 3cm wound to dorsum of left foot without erythema 1+ pitting edema lle    Assessment/plan     76 year old female follows up from recent bilateral iliac artery stenting. Unfortunately she has developed quite a large wound from a blister on her left foot. This time she has a lot going on and she is taking Coumadin as previously prescribed for atrial fibrillation. I have offered for either bypass surgery versus repeat endovascular evaluation possible intervention versus waiting to see if this wound heals given that it is secondary to a blister. At this time the sister and patient have a lot going on and desire to wait 2 weeks. They will follow-up then and if the wound has not healed or healing we will pursue endovascular intervention at that time. Should the wound it worse taking call and we can do this on a more urgent basis. All questions were answered we'll see them in 2 weeks.  [place] Devine C. Donzetta Matters, MD [facility] Office: [phone] Pager: [phone]

## 2016-04-22 ENCOUNTER — Other Ambulatory Visit: Payer: Self-pay | Admitting: General Surgery

## 2016-04-29 ENCOUNTER — Ambulatory Visit: Payer: Medicare HMO | Admitting: Cardiology

## 2016-05-06 ENCOUNTER — Telehealth: Payer: Self-pay | Admitting: Cardiology

## 2016-05-06 NOTE — Telephone Encounter (Signed)
New message      Calling to check the status on a clearance form that was faxed several weeks ago.  Please call

## 2016-05-06 NOTE — Telephone Encounter (Signed)
Spoke with triage nurse at [facility].  Aware clearance can not be given until the pt has been seen and the appt is 4/27

## 2016-05-09 ENCOUNTER — Encounter: Payer: Self-pay | Admitting: Vascular Surgery

## 2016-05-10 ENCOUNTER — Ambulatory Visit: Payer: Medicare HMO | Admitting: Vascular Surgery

## 2016-05-10 ENCOUNTER — Ambulatory Visit (INDEPENDENT_AMBULATORY_CARE_PROVIDER_SITE_OTHER): Payer: Medicare HMO | Admitting: Cardiology

## 2016-05-10 ENCOUNTER — Encounter: Payer: Self-pay | Admitting: Cardiology

## 2016-05-10 ENCOUNTER — Encounter (INDEPENDENT_AMBULATORY_CARE_PROVIDER_SITE_OTHER): Payer: Self-pay

## 2016-05-10 VITALS — BP 102/70 | HR 80 | Ht 65.0 in | Wt 148.0 lb

## 2016-05-10 DIAGNOSIS — I5023 Acute on chronic systolic (congestive) heart failure: Secondary | ICD-10-CM | POA: Diagnosis not present

## 2016-05-10 DIAGNOSIS — I4821 Permanent atrial fibrillation: Secondary | ICD-10-CM | POA: Insufficient documentation

## 2016-05-10 DIAGNOSIS — I482 Chronic atrial fibrillation: Secondary | ICD-10-CM | POA: Diagnosis not present

## 2016-05-10 DIAGNOSIS — Z7901 Long term (current) use of anticoagulants: Secondary | ICD-10-CM | POA: Diagnosis not present

## 2016-05-10 DIAGNOSIS — I739 Peripheral vascular disease, unspecified: Secondary | ICD-10-CM | POA: Diagnosis not present

## 2016-05-10 DIAGNOSIS — Z89511 Acquired absence of right leg below knee: Secondary | ICD-10-CM | POA: Diagnosis not present

## 2016-05-10 NOTE — Progress Notes (Signed)
Cardiology Office Note:    Date:  05/10/2016   ID:  Sarah Moss, DOB Nov 15, 1940, MRN [id number]  PCP:  Pcp Not In System  Cardiologist:  Candee Furbish, MD     Referring MD: [name], PA-C    History of Present Illness:    Sarah Moss is a 76 y.o. female with a hx of Sick Jude pacemaker placement in December 2015 with right BKA from accident, peripheral arterial disease status post left superficial femoral artery stenting, diabetes with hypertension, atrial fibrillation with tachycardia-bradycardia syndrome on chronic anticoagulation with Coumadin here for post hospital follow-up after being admitted with left ventricular dysfunction, shortness of breath, acute systolic heart failure.  In review of my consult note from the hospital on 03/22/16 she received care from Dr. [name] in [facility]. She was recently discharged from [facility] on December 2017 for CHF exacerbation. Prior to that in September 2017 her echocardiogram showed EF of 50-55% with moderate mitral regurgitation and tricuspid regurgitation with moderate pulmonary hypertension.  She was seen by vascular surgery, Dr. Trula Slade, ABI suggested significant PVD and she underwent peripheral vascular angiogram with stent to her left common iliac artery and right common iliac artery. An echo was performed and demonstrated EF with apparently new ejection fraction of 20%. At that time, she was unable to give me any details of past medical history, describing "how did I get here ". She was not oriented. She did however deny any dyspnea or chest pain.  She was seen by Sarah Moss here in our office on 04/02/16 posthospitalization she was getting physical therapy at the time. She was having dyspnea on exertion at that time unable to get to the bedroom to bathroom without shortness of breath. [facility].  By history, he was also noted that she has a single kidney and stent placed in 2006 by Dr. early. Her Lasix was increased to 40  mg twice a day to 80 mg a.m. 40 mg p.m. for 3 days after that visit.  She also saw Dr. Barry Dienes of general surgery. She had a melanoma excised from her mid chest wall and they would like to go back in to take out more tissue. Please see below.  Past Medical History:  Diagnosis Date  . Asthma   . CHF (congestive heart failure) ([place])   . COPD (chronic obstructive pulmonary disease) ([place])   . Diabetes mellitus without complication ([place])   . GERD (gastroesophageal reflux disease)   . Hx of right BKA ([place])   . Hypertension     Past Surgical History:  Procedure Laterality Date  . LOWER EXTREMITY ANGIOGRAPHY N/A 03/19/2016   Procedure: Lower Extremity Angiography;  Surgeon: Serafina Mitchell, MD;  Location: [place] [facility];  Service: Cardiovascular;  Laterality: N/A;  . PERIPHERAL VASCULAR INTERVENTION Bilateral 03/19/2016   Procedure: Peripheral Vascular Intervention;  Surgeon: Serafina Mitchell, MD;  Location: [place] [facility];  Service: Cardiovascular;  Laterality: Bilateral;  iliacs    Current Medications: Current Meds  Medication Sig  . ADVAIR DISKUS 250-50 MCG/DOSE AEPB Inhale 1 puff into the lungs 2 (two) times daily.  Marland Kitchen ALPRAZolam (XANAX) 0.25 MG tablet Take 1 tablet (0.25 mg total) by mouth every 12 (twelve) hours as needed for anxiety.  Marland Kitchen aspirin EC 81 MG EC tablet Take 1 tablet (81 mg total) by mouth daily.  Marland Kitchen atorvastatin (LIPITOR) 40 MG tablet Take 40 mg by mouth daily.  . carvedilol (COREG) 6.25 MG tablet Take 1 tablet (6.25  mg total) by mouth 2 (two) times daily with a meal.  . Cholecalciferol (D3-50) 50000 units capsule Take 50,000 Units by mouth daily.  . DULoxetine (CYMBALTA) 60 MG capsule Take 60 mg by mouth daily.  Marland Kitchen gabapentin (NEURONTIN) 300 MG capsule Take 300 mg by mouth at bedtime.  . iron polysaccharides (NIFEREX) 150 MG capsule Take 1 capsule (150 mg total) by mouth daily.  Marland Kitchen levothyroxine (SYNTHROID, LEVOTHROID) 25 MCG tablet Take 25 mcg by mouth daily.  Marland Kitchen  lisinopril (PRINIVIL,ZESTRIL) 20 MG tablet Take 20 mg by mouth daily.  . metFORMIN (GLUCOPHAGE) 1000 MG tablet Take 1,000 mg by mouth 2 (two) times daily.  . montelukast (SINGULAIR) 10 MG tablet Take 10 mg by mouth at bedtime.  . nitroGLYCERIN (NITROSTAT) 0.4 MG SL tablet Place 0.4 mg under the tongue every 5 (five) minutes as needed for chest pain.  Marland Kitchen omeprazole (PRILOSEC) 40 MG capsule Take 40 mg by mouth daily.  . polyethylene glycol (MIRALAX / GLYCOLAX) packet Take 17 g by mouth 2 (two) times daily.  . potassium chloride (K-DUR) 10 MEQ tablet Take 10 mEq by mouth 2 (two) times daily. For 30 days  . senna-docusate (SENOKOT-S) 8.6-50 MG tablet Take 1 tablet by mouth 2 (two) times daily.  . VENTOLIN HFA 108 (90 Base) MCG/ACT inhaler Inhale 1 puff into the lungs every 6 (six) hours as needed for shortness of breath.   . warfarin (COUMADIN) 4 MG tablet Take 4-6 mg by mouth daily. Patient alternates between 4 mg and 6 mg daily. Patient had 4 mg on 03/17/16  . [DISCONTINUED] furosemide (LASIX) 40 MG tablet Take 1 tablet (40 mg total) by mouth as directed. 80 mg in the AM and 40 mg in the PM for 3 days; then resume lasix 40 mg twice daily     Allergies:   Codeine; Ativan [lorazepam]; and Ambien [zolpidem]   Social History   Social History  . Marital status: Married    Spouse name: N/A  . Number of children: N/A  . Years of education: N/A   Social History Main Topics  . Smoking status: Former Research scientist (life sciences)  . Smokeless tobacco: Never Used  . Alcohol use None  . Drug use: Unknown  . Sexual activity: Not Asked   Other Topics Concern  . None   Social History Narrative  . None     family history includes Cancer in her father; Diabetes in her mother; Stroke in her mother. ROS:   Please see the history of present illness.     All other systems reviewed and are negative.   EKGs/Labs/Other Studies Reviewed:    EKG:  Paced  Recent Labs: 03/18/2016: B Natriuretic Peptide 765.5; TSH  4.251 03/19/2016: ALT 15 03/21/2016: Magnesium 2.1 03/23/2016: Hemoglobin 8.4; Platelets 185 04/02/2016: BUN 20; Creat 1.20; Potassium 5.5; Sodium 143   Recent Lipid Panel No results found for: CHOL, TRIG, HDL, CHOLHDL, VLDL, LDLCALC, LDLDIRECT  Physical Exam:    VS:  BP 102/70   Pulse 80   Ht 5\' 5"  (1.651 m)   Wt 148 lb (67.1 kg)   SpO2 98%   BMI 24.63 kg/m     Wt Readings from Last 3 Encounters:  05/10/16 148 lb (67.1 kg)  04/19/16 154 lb (69.9 kg)  03/23/16 156 lb 6.4 oz (70.9 kg)     GEN:  Well nourished, well developed in no acute distress, In wheelchair HEENT: Normal NECK: Mid neck JVD; No carotid bruits LYMPHATICS: No lymphadenopathy CARDIAC: RRR, no murmurs, rubs,  gallops RESPIRATORY:  Clear to auscultation without rales, wheezing or rhonchi  ABDOMEN: Soft, non-tender, non-distended Right upper quadrant surgical scar noted MUSCULOSKELETAL:  No edema; right BKA, trace edema, distal pulses reduced  SKIN: Warm and dry, previously described 5 x 3 cm wound to dorsum of left foot without erythema, this was noted by Dr. Donzetta Matters of vascular surgery. This wound is improving. It is now scabbed over. Stump has 2+ edema, left foot has 1-2+ edema NEUROLOGIC:  Alert but not oriented. Pleasant. PSYCHIATRIC:  Pleasant, dementia noted   CXR 2 view 03/18/16 IMPRESSION: Central mild vascular congestion without convincing pulmonary edema. Stable trace bilateral pleural effusion with basilar atelectasis. No segmental infiltrates.  Echo 03/20/16 Study Conclusions  - Left ventricle: The cavity size was normal. Wall thickness was normal. Systolic function was severely reduced. The estimated ejection fraction was in the range of 20% to 25%. Diffuse hypokinesis. Doppler parameters are consistent with both elevated ventricular end-diastolic filling pressure and elevated left atrial filling pressure. - Aortic valve: Valve area (VTI): 0.63 cm^2. Valve area (Vmax): 0.89 cm^2. Valve  area (Vmean): 0.75 cm^2. - Mitral valve: Calcified annulus. Mildly thickened leaflets . There was mild regurgitation. - Right ventricle: The cavity size was moderately dilated. - Right atrium: The atrium was moderately dilated. - Atrial septum: No defect or patent foramen ovale was identified. - Tricuspid valve: There was moderate-severe regurgitation. - Pulmonary arteries: PA peak pressure: 39 mm Hg (S). - Pericardium, extracardiac: There was a left pleural effusion.   ASSESSMENT:    1. Acute on chronic systolic congestive heart failure ([place])   2. PAD (peripheral artery disease) ([place])   3. Chronic anticoagulation   4. Status post below knee amputation of right lower extremity ([place])   5. Permanent atrial fibrillation (H[facility])    PLAN:    In order of problems listed above:  Acute on Chronic systolic heart failure/dilated cardiomyopathy  - Newly reduced ejection fraction of 20% with global hypokinesis, in September 2017 EF was 50% at [facility].  - During hospitalization we stopped diltiazem and started carvedilol. Continue ACE inhibitor lisinopril.   - We will continue with carvedilol 6.25 g twice a day given her systolic blood pressure of 102.  - We will increase her Lasix to 80 mg twice a day. She clearly has fluid especially in her right BKA stump as well as left foot. We will check a basic metabolic profile in one week. We will have her return to clinic in 2 weeks to see APP  - We will continue to titrate her medications and repeat her ultrasound in 90 days.  - If ejection fraction remains decreased, we will consider diagnostic cardiac catheterization however we have to weigh the pros and cons especially given her questionable baseline dementia.  Peripheral vascular disease  - Dr. Trula Slade, common iliac stenting  Permanent atrial fibrillation  - Coumadin therapy  - Pacemaker interrogation showed permanent, 100% atrial fibrillation when reviewed by Dr. Recardo Evangelist  Pacemaker St.  Jude  - She is an appointment upcoming with Dr. Caryl Comes in June. We will have him determine if she needs her merlin bedside device at [facility] at that time. She has been without it for the past 2 years. Ritta Slot is reluctant to look it up without official EP order.  Amputated right BKA.  Peripheral vascular disease with wound/blister left foot  - Seen by Dr. Servando Snare   - She was offered either bypass surgery versus repeat endovascular evaluation with possible intervention versus waiting  to see if wound heals. Sister and patient stated that they had a lot going on in their life and desired to wait. They are going to follow-up and if the wound has not healed there will pursue endovascular intervention. Should it worsen, they can perform a procedure on a more urgent basis. The wound was described as 5.5 x 3 cm wound to dorsum of left foot without erythema. The wound is improving. She has follow-up soon.  Preoperative cardiovascular risk stratification  - Dr. Barry Dienes performed melanoma excision. She would like to go under general anesthesia to remove further tissue around her chest wall. At this time given her acute systolic heart failure, EF of 20%, clear fluid overload, recent left dorsum foot wound, I would not proceed with general anesthesia at this time as she is high risk from a cardiac perspective. We will continue to treat her heart failure and reassess. If surgery is felt to be emergent or urgent, please let me know.  Dementia  - Her sister does state that she has increasing worsening baseline dementia. She was not oriented to place or time.  Medication Adjustments/Labs and Tests Ordered: Current medicines are reviewed at length with the patient today.  Concerns regarding medicines are outlined above. Labs and tests ordered and medication changes are outlined in the patient instructions below:  Patient Instructions  Increase Lasix 80 mg twice a day   Lab ( bmet ) in 1 week Friday  05/17/16   Continue all other medications    Your physician recommends that you schedule a follow-up appointment in: 2 weeks with extender    Keep appointment as planned with Dr.Klein Thursday 07/11/16 at 3:15 pm     Signed, Candee Furbish, MD  05/10/2016 9:02 AM    [place]

## 2016-05-10 NOTE — Patient Instructions (Signed)
Increase Lasix 80 mg twice a day   Lab ( bmet ) in 1 week Friday 05/17/16   Continue all other medications    Your physician recommends that you schedule a follow-up appointment in: 2 weeks with extender    Keep appointment as planned with Dr.Klein Thursday 07/11/16 at 3:15 pm

## 2016-05-17 ENCOUNTER — Other Ambulatory Visit: Payer: Medicare HMO | Admitting: *Deleted

## 2016-05-17 ENCOUNTER — Other Ambulatory Visit: Payer: Medicare HMO

## 2016-05-17 DIAGNOSIS — Z7901 Long term (current) use of anticoagulants: Secondary | ICD-10-CM

## 2016-05-17 DIAGNOSIS — I4821 Permanent atrial fibrillation: Secondary | ICD-10-CM

## 2016-05-17 DIAGNOSIS — I739 Peripheral vascular disease, unspecified: Secondary | ICD-10-CM

## 2016-05-17 DIAGNOSIS — Z89511 Acquired absence of right leg below knee: Secondary | ICD-10-CM

## 2016-05-17 DIAGNOSIS — I5023 Acute on chronic systolic (congestive) heart failure: Secondary | ICD-10-CM

## 2016-05-17 LAB — BASIC METABOLIC PANEL
BUN/Creatinine Ratio: 21 (ref 12–28)
BUN: 21 mg/dL (ref 8–27)
CALCIUM: 8.9 mg/dL (ref 8.7–10.3)
CO2: 24 mmol/L (ref 18–29)
CREATININE: 1.01 mg/dL — AB (ref 0.57–1.00)
Chloride: 95 mmol/L — ABNORMAL LOW (ref 96–106)
GFR, EST AFRICAN AMERICAN: 62 mL/min/{1.73_m2} (ref >59)
GFR, EST NON AFRICAN AMERICAN: 54 mL/min/{1.73_m2} — AB (ref >59)
Glucose: 101 mg/dL — ABNORMAL HIGH (ref 65–99)
Potassium: 3.3 mmol/L — ABNORMAL LOW (ref 3.5–5.2)
Sodium: 144 mmol/L (ref 134–144)

## 2016-05-22 ENCOUNTER — Ambulatory Visit: Payer: Medicare HMO | Admitting: Vascular Surgery

## 2016-05-23 ENCOUNTER — Encounter: Payer: Self-pay | Admitting: Cardiology

## 2016-05-24 ENCOUNTER — Other Ambulatory Visit: Payer: Medicare HMO

## 2016-05-26 NOTE — Progress Notes (Signed)
Cardiology Office Note   Date:  05/27/2016   ID:  Sarah Moss, DOB 1940-09-01, MRN [id number]  PCP:  System, Pcp Not In  Cardiologist:  Dr. Marlou Porch    Chief Complaint  Patient presents with  . Congestive Heart Failure      History of Present Illness: Sarah Moss is a 76 y.o. female who presents for CHF and edema.  Plan to adjust meds.   [name] of St. 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 Marlou Porch for post hospital follow-up after being admitted with left ventricular dysfunction, shortness of breath, acute systolic heart failure. She had also been hospitalized in 12/2015 at [facility] for CHF.  In 09/2015 echo with EF 50-55%, with mod MR and TR with mod. Pul. HTN. On last visit lasix increased to 80 mg BID but edema and wt have gone up.    To have Merlin at [facility] pt needs EP  Dr. Caryl Comes to send in order, she is seeing him July 11, 2016.     She has had stent to LCIA and RCIA by Dr. Trula Slade.   Echo was done with new EF 20%,  Other hx of single kidney and stent placed in 2006 to renal artery.  Now on BB, lasix increased, plan to titrate meds and repeat echo in 3 months.    Pt also with some dementia..    Today she continues with lower ext edema.  The left has edema to above Lt knee, the support stockings roll and make indention so sister is concerned. ACE wrap would work better.  She is in wheelchair with Lt leg down the wheelchair does not have Lt foot pedal to raise leg.   In bed she is not having Lt leg raised.  She is supposed to be on low salt diet but at times she is served salt.  The sister does not know when last coumadin was done.  I sent in order for at least every 4 weeks but that our coumadin  clinic would be glad to manage.     Dr. Barry Dienes with general surgery needs to do surgery for melanoma that will require admit -we have asked them to postpone due to HF.  She has no chest pain and some SOB.  But mostly lower ext edema.   Wound of Lt foot is almost healed.    Past Medical History:  Diagnosis Date  . Asthma   . CHF (congestive heart failure) ([place])   . COPD (chronic obstructive pulmonary disease) ([place])   . Diabetes mellitus without complication ([place])   . GERD (gastroesophageal reflux disease)   . Hx of right BKA ([place])   . Hypertension     Past Surgical History:  Procedure Laterality Date  . LOWER EXTREMITY ANGIOGRAPHY N/A 03/19/2016   Procedure: Lower Extremity Angiography;  Surgeon: Serafina Mitchell, MD;  Location: [place] [facility];  Service: Cardiovascular;  Laterality: N/A;  . PERIPHERAL VASCULAR INTERVENTION Bilateral 03/19/2016   Procedure: Peripheral Vascular Intervention;  Surgeon: Serafina Mitchell, MD;  Location: [place] [facility];  Service: Cardiovascular;  Laterality: Bilateral;  iliacs     Current Outpatient Prescriptions  Medication Sig Dispense Refill  . ADVAIR DISKUS 250-50 MCG/DOSE AEPB Inhale 1 puff into the lungs 2 (two) times daily.    Marland Kitchen  ALPRAZolam (XANAX) 0.25 MG tablet Take 1 tablet (0.25 mg total) by mouth every 12 (twelve) hours as needed for anxiety. 15 tablet 0  . aspirin EC 81 MG EC tablet Take 1 tablet (81 mg total) by mouth daily.    Marland Kitchen atorvastatin (LIPITOR) 40 MG tablet Take 40 mg by mouth daily.    . carvedilol (COREG) 12.5 MG tablet Take 1 tablet (12.5 mg total) by mouth 2 (two) times daily with a meal. 60 tablet 9  . Cholecalciferol (D3-50) 50000 units capsule Take 50,000 Units by mouth daily.    . DULoxetine (CYMBALTA) 60 MG capsule Take 60 mg by mouth daily.    . furosemide (LASIX) 40 MG tablet Take 40 mg twice a day 90 tablet 3  . gabapentin (NEURONTIN) 300 MG capsule Take 300 mg by mouth at bedtime.    . iron polysaccharides (NIFEREX) 150  MG capsule Take 1 capsule (150 mg total) by mouth daily.    Marland Kitchen levothyroxine (SYNTHROID, LEVOTHROID) 25 MCG tablet Take 25 mcg by mouth daily.    Marland Kitchen lisinopril (PRINIVIL,ZESTRIL) 20 MG tablet Take 20 mg by mouth daily.    . metFORMIN (GLUCOPHAGE) 1000 MG tablet Take 1,000 mg by mouth 2 (two) times daily.    . montelukast (SINGULAIR) 10 MG tablet Take 10 mg by mouth at bedtime.    . nitroGLYCERIN (NITROSTAT) 0.4 MG SL tablet Place 0.4 mg under the tongue every 5 (five) minutes as needed for chest pain.    Marland Kitchen omeprazole (PRILOSEC) 40 MG capsule Take 40 mg by mouth daily.    Marland Kitchen oxyCODONE-acetaminophen (PERCOCET/ROXICET) 5-325 MG tablet Take 1 tablet by mouth every 4 (four) hours as needed for severe pain.    . polyethylene glycol (MIRALAX / GLYCOLAX) packet Take 17 g by mouth 2 (two) times daily. 14 each 0  . senna-docusate (SENOKOT-S) 8.6-50 MG tablet Take 1 tablet by mouth 2 (two) times daily.    Marland Kitchen spironolactone (ALDACTONE) 25 MG tablet Take 25 mg by mouth daily.    . VENTOLIN HFA 108 (90 Base) MCG/ACT inhaler Inhale 1 puff into the lungs every 6 (six) hours as needed for shortness of breath.     . warfarin (COUMADIN) 4 MG tablet Take 4-6 mg by mouth daily. Patient alternates between 4 mg and 6 mg daily. Patient had 4 mg on 03/17/16     No current facility-administered medications for this visit.     Allergies:   Codeine; Ativan [lorazepam]; and Ambien [zolpidem]    Social History:  The patient  reports that she has quit smoking. She has never used smokeless tobacco. She reports that she does not drink alcohol or use drugs.   Family History:  The patient's family history includes Cancer in her father; Diabetes in her mother; Stroke in her mother.    ROS:  General:no colds or fevers, + weight increase by 10 lbs.  Skin:no rashes or ulcers HEENT:no blurred vision, no congestion CV:see HPI PUL:see HPI GI:no diarrhea constipation or melena, no indigestion GU:no hematuria, no dysuria MS:no joint  pain, no claudication Neuro:no syncope, no lightheadedness Endo:no diabetes, no thyroid disease  Wt Readings from Last 3 Encounters:  05/27/16 158 lb (71.7 kg)  05/10/16 148 lb (67.1 kg)  04/19/16 154 lb (69.9 kg)     PHYSICAL EXAM: VS:  BP (!) 143/61   Pulse 82   Ht 5\' 5"  (1.651 m)   Wt 158 lb (71.7 kg)   BMI 26.29 kg/m  , BMI Body mass index  is 26.29 kg/m. General:Pleasant affect, NAD Skin:Warm and dry, brisk capillary refill HEENT:normocephalic, sclera clear, mucus membranes moist Neck:supple, no JVD, no bruits  Heart:S1S2 RRR with 9-6/2 systolic murmur, no gallup, rub or click Lungs:clear without rales, rhonchi, or wheezes IWL:NLGX, non tender, + BS, do not palpate liver spleen or masses Ext:+ lt lower ext edema 2-3+ to above knee, rt with edema at stump.   2+ radial pulses, Lt foot with petechial rash from edema.  Previous wound from fluid blister is almost healed and with scab.   Neuro:alert and oriented, MAE, follows commands, + facial symmetry    EKG:  EKG is NOT ordered today.    Recent Labs: 03/18/2016: B Natriuretic Peptide 765.5; TSH 4.251 03/19/2016: ALT 15 03/21/2016: Magnesium 2.1 03/23/2016: Hemoglobin 8.4; Platelets 185 05/17/2016: BUN 21; Creatinine, Ser 1.01; Potassium 3.3; Sodium 144    Lipid Panel No results found for: CHOL, TRIG, HDL, CHOLHDL, VLDL, LDLCALC, LDLDIRECT     Other studies Reviewed: Additional studies/ records that were reviewed today include: . Echo 03/20/16  Study Conclusions  - Left ventricle: The cavity size was normal. Wall thickness was   normal. Systolic function was severely reduced. The estimated   ejection fraction was in the range of 20% to 25%. Diffuse   hypokinesis. Doppler parameters are consistent with both elevated   ventricular end-diastolic filling pressure and elevated left   atrial filling pressure. - Aortic valve: Valve area (VTI): 0.63 cm^2. Valve area (Vmax):   0.89 cm^2. Valve area (Vmean): 0.75 cm^2. -  Mitral valve: Calcified annulus. Mildly thickened leaflets .   There was mild regurgitation. - Right ventricle: The cavity size was moderately dilated. - Right atrium: The atrium was moderately dilated. - Atrial septum: No defect or patent foramen ovale was identified. - Tricuspid valve: There was moderate-severe regurgitation. - Pulmonary arteries: PA peak pressure: 39 mm Hg (S). - Pericardium, extracardiac: There was a left pleural effusion.  PV angio Impression:            #1  patent right renal stent.  Greater than 50% left renal artery stenosis            #2  occluded right common iliac artery which was able to be recanalized and stented using an Atrium 8 x 38.            #3  significant in-stent stenosis within the left common iliac artery stented using a Atrium 7 x 38 with residual stenosis less than 10%            #4  occlusion of the left superficial femoral artery with above-knee popliteal artery reconstitution and three-vessel runoff via 3 very small tibial vessels            #5  unclear if dissection into the right common iliac and aorta was iatrogenic or pre-existing.  It was resolved after intervention  ASSESSMENT AND PLAN:  1.  ACUTE on chronic systolic HF with EF 20 % down from 50% at [facility] in 09/2015.  Now on coreg with HR 82 BP 211 systolic on last visit and no change.  Today BP higher.  Has been on lasix 80 mg BID with increase of wt by 10 lbs and lower ext edema.  Will check labs.  For now will take lasix 80 mg every AM and 40 mg in PM which is a decrease but will plan this dose until BMP is back. She has a single kidney, may need to decreased lasix and  add metolazone.  Now off K+ and on spironolactone.  Will check Bmet today  She will follow up with Dr. Marlou Porch or myself in 3-4 weeks.   2. Cardiomyopathy on BB will increase to 12.5 BID.  Coreg  3. PAD followed by Dr. Trula Slade  4.  BKA on Rt  5. Permanent a fib.  On coumadin, followed at [facility]   Current  medicines are reviewed with the patient today.  The patient Has no concerns regarding medicines.  The following changes have been made:  See above Labs/ tests ordered today include:see above  Disposition:   FU:  see above  Signed, Cecilie Kicks, NP  05/27/2016 10:06 AM    [place] [place] [address], [place], [place] [place], [place] Phone: [phone]; Fax: [phone]

## 2016-05-27 ENCOUNTER — Encounter: Payer: Self-pay | Admitting: Cardiology

## 2016-05-27 ENCOUNTER — Ambulatory Visit (INDEPENDENT_AMBULATORY_CARE_PROVIDER_SITE_OTHER): Payer: Medicare HMO | Admitting: Cardiology

## 2016-05-27 ENCOUNTER — Encounter: Payer: Self-pay | Admitting: Vascular Surgery

## 2016-05-27 VITALS — BP 143/61 | HR 82 | Ht 65.0 in | Wt 158.0 lb

## 2016-05-27 DIAGNOSIS — Z7901 Long term (current) use of anticoagulants: Secondary | ICD-10-CM

## 2016-05-27 DIAGNOSIS — Z89511 Acquired absence of right leg below knee: Secondary | ICD-10-CM | POA: Diagnosis not present

## 2016-05-27 DIAGNOSIS — I482 Chronic atrial fibrillation: Secondary | ICD-10-CM | POA: Diagnosis not present

## 2016-05-27 DIAGNOSIS — I739 Peripheral vascular disease, unspecified: Secondary | ICD-10-CM

## 2016-05-27 DIAGNOSIS — I4821 Permanent atrial fibrillation: Secondary | ICD-10-CM

## 2016-05-27 DIAGNOSIS — I5023 Acute on chronic systolic (congestive) heart failure: Secondary | ICD-10-CM

## 2016-05-27 DIAGNOSIS — E876 Hypokalemia: Secondary | ICD-10-CM

## 2016-05-27 LAB — BASIC METABOLIC PANEL
BUN/Creatinine Ratio: 25 (ref 12–28)
BUN: 29 mg/dL — ABNORMAL HIGH (ref 8–27)
CO2: 27 mmol/L (ref 18–29)
Calcium: 9.7 mg/dL (ref 8.7–10.3)
Chloride: 96 mmol/L (ref 96–106)
Creatinine, Ser: 1.15 mg/dL — ABNORMAL HIGH (ref 0.57–1.00)
GFR calc Af Amer: 53 mL/min/{1.73_m2} — ABNORMAL LOW (ref >59)
GFR, EST NON AFRICAN AMERICAN: 46 mL/min/{1.73_m2} — AB (ref >59)
GLUCOSE: 115 mg/dL — AB (ref 65–99)
Potassium: 4.3 mmol/L (ref 3.5–5.2)
SODIUM: 141 mmol/L (ref 134–144)

## 2016-05-27 MED ORDER — CARVEDILOL 12.5 MG PO TABS: 12.5000 mg | ORAL_TABLET | Freq: Two times a day (BID) | ORAL | 9 refills | Status: AC

## 2016-05-27 NOTE — Patient Instructions (Signed)
Medication Instructions:   Your physician has recommended you make the following change in your medication:  1. Increase lasix (80 mg ) am (40 mg) pm for three days than go back to (40 mg ) twice a day.  2. Increase Coreg (12. 5 mg ) twice daily.   Labwork: Your physician recommends that you have lab work today: bmet   Testing/Procedures: -None  Follow-Up: Your physician recommends that you keep your scheduled  follow-up appointment with Dr. Marlou Porch. Your physician recommends that you keep your scheduled  follow-up appointment with Dr. Caryl Comes.   Any Other Special Instructions Will Be Listed Below (If Applicable).  1. Use Ace Wrap on left leg instead of compression hose 2. Elevate left leg in bed or wheelchair. 3. Get INR checks every 4 weeks.    If you need a refill on your cardiac medications before your next appointment, please call your pharmacy.   Low-Sodium Eating Plan Sodium, which is an element that makes up salt, helps you maintain a healthy balance of fluids in your body. Too much sodium can increase your blood pressure and cause fluid and waste to be held in your body. Your health care provider or dietitian may recommend following this plan if you have high blood pressure (hypertension), kidney disease, liver disease, or heart failure. Eating less sodium can help lower your blood pressure, reduce swelling, and protect your heart, liver, and kidneys. What are tips for following this plan? General guidelines   Most people on this plan should limit their sodium intake to 1,500-2,000 mg (milligrams) of sodium each day. Reading food labels   The Nutrition Facts label lists the amount of sodium in one serving of the food. If you eat more than one serving, you must multiply the listed amount of sodium by the number of servings.  Choose foods with less than 140 mg of sodium per serving.  Avoid foods with 300 mg of sodium or more per serving. Shopping   Look for  lower-sodium products, often labeled as "low-sodium" or "no salt added."  Always check the sodium content even if foods are labeled as "unsalted" or "no salt added".  Buy fresh foods.  Avoid canned foods and premade or frozen meals.  Avoid canned, cured, or processed meats  Buy breads that have less than 80 mg of sodium per slice. Cooking   Eat more home-cooked food and less restaurant, buffet, and fast food.  Avoid adding salt when cooking. Use salt-free seasonings or herbs instead of table salt or sea salt. Check with your health care provider or pharmacist before using salt substitutes.  Cook with plant-based oils, such as canola, sunflower, or olive oil. Meal planning   When eating at a restaurant, ask that your food be prepared with less salt or no salt, if possible.  Avoid foods that contain MSG (monosodium glutamate). MSG is sometimes added to [place] food, bouillon, and some canned foods. What foods are recommended? The items listed may not be a complete list. Talk with your dietitian about what dietary choices are best for you. Grains  Low-sodium cereals, including oats, puffed wheat and rice, and shredded wheat. Low-sodium crackers. Unsalted rice. Unsalted pasta. Low-sodium bread. Whole-grain breads and whole-grain pasta. Vegetables  Fresh or frozen vegetables. "No salt added" canned vegetables. "No salt added" tomato sauce and paste. Low-sodium or reduced-sodium tomato and vegetable juice. Fruits  Fresh, frozen, or canned fruit. Fruit juice. Meats and other protein foods  Fresh or frozen (no salt added) meat, poultry,  seafood, and fish. Low-sodium canned tuna and salmon. Unsalted nuts. Dried peas, beans, and lentils without added salt. Unsalted canned beans. Eggs. Unsalted nut butters. Dairy  Milk. Soy milk. Cheese that is naturally low in sodium, such as ricotta cheese, fresh mozzarella, or Swiss cheese Low-sodium or reduced-sodium cheese. Cream cheese. Yogurt. Fats and  oils  Unsalted butter. Unsalted margarine with no trans fat. Vegetable oils such as canola or olive oils. Seasonings and other foods  Fresh and dried herbs and spices. Salt-free seasonings. Low-sodium mustard and ketchup. Sodium-free salad dressing. Sodium-free light mayonnaise. Fresh or refrigerated horseradish. Lemon juice. Vinegar. Homemade, reduced-sodium, or low-sodium soups. Unsalted popcorn and pretzels. Low-salt or salt-free chips. What foods are not recommended? The items listed may not be a complete list. Talk with your dietitian about what dietary choices are best for you. Grains  Instant hot cereals. Bread stuffing, pancake, and biscuit mixes. Croutons. Seasoned rice or pasta mixes. Noodle soup cups. Boxed or frozen macaroni and cheese. Regular salted crackers. Self-rising flour. Vegetables  Sauerkraut, pickled vegetables, and relishes. Olives. [place] fries. Onion rings. Regular canned vegetables (not low-sodium or reduced-sodium). Regular canned tomato sauce and paste (not low-sodium or reduced-sodium). Regular tomato and vegetable juice (not low-sodium or reduced-sodium). Frozen vegetables in sauces. Meats and other protein foods  Meat or fish that is salted, canned, smoked, spiced, or pickled. Bacon, ham, sausage, hotdogs, corned beef, chipped beef, packaged lunch meats, salt pork, jerky, pickled herring, anchovies, regular canned tuna, sardines, salted nuts. Dairy  Processed cheese and cheese spreads. Cheese curds. Blue cheese. Feta cheese. String cheese. Regular cottage cheese. Buttermilk. Canned milk. Fats and oils  Salted butter. Regular margarine. Ghee. Bacon fat. Seasonings and other foods  Onion salt, garlic salt, seasoned salt, table salt, and sea salt. Canned and packaged gravies. Worcestershire sauce. Tartar sauce. Barbecue sauce. Teriyaki sauce. Soy sauce, including reduced-sodium. Steak sauce. Fish sauce. Oyster sauce. Cocktail sauce. Horseradish that you find on the  shelf. Regular ketchup and mustard. Meat flavorings and tenderizers. Bouillon cubes. Hot sauce and Tabasco sauce. Premade or packaged marinades. Premade or packaged taco seasonings. Relishes. Regular salad dressings. Salsa. Potato and tortilla chips. Corn chips and puffs. Salted popcorn and pretzels. Canned or dried soups. Pizza. Frozen entrees and pot pies. Summary  Eating less sodium can help lower your blood pressure, reduce swelling, and protect your heart, liver, and kidneys.  Most people on this plan should limit their sodium intake to 1,500-2,000 mg (milligrams) of sodium each day.  Canned, boxed, and frozen foods are high in sodium. Restaurant foods, fast foods, and pizza are also very high in sodium. You also get sodium by adding salt to food.  Try to cook at home, eat more fresh fruits and vegetables, and eat less fast food, canned, processed, or prepared foods. This information is not intended to replace advice given to you by your health care provider. Make sure you discuss any questions you have with your health care provider. Document Released: 06/22/2001 Document Revised: 12/25/2015 Document Reviewed: 12/25/2015 Elsevier Interactive Patient Education  2017 [employer].

## 2016-05-28 ENCOUNTER — Telehealth: Payer: Self-pay | Admitting: *Deleted

## 2016-05-28 DIAGNOSIS — Z89511 Acquired absence of right leg below knee: Secondary | ICD-10-CM

## 2016-05-28 DIAGNOSIS — I5023 Acute on chronic systolic (congestive) heart failure: Secondary | ICD-10-CM

## 2016-05-28 DIAGNOSIS — I739 Peripheral vascular disease, unspecified: Secondary | ICD-10-CM

## 2016-05-28 DIAGNOSIS — Z7901 Long term (current) use of anticoagulants: Secondary | ICD-10-CM

## 2016-05-28 DIAGNOSIS — I4821 Permanent atrial fibrillation: Secondary | ICD-10-CM

## 2016-05-28 MED ORDER — FUROSEMIDE 40 MG PO TABS: 80.0000 mg | ORAL_TABLET | Freq: Two times a day (BID) | ORAL | 3 refills | Status: DC

## 2016-05-28 MED ORDER — METOLAZONE 2.5 MG PO TABS: 2.5000 mg | ORAL_TABLET | ORAL | 3 refills | Status: DC

## 2016-05-28 NOTE — Telephone Encounter (Signed)
-----   Message from Isaiah Serge, NP sent at 05/28/2016 11:49 AM EDT ----- I discussed with Dr. Marlou Porch and he wants to go back to 80 mg of Lasix twice a day and on  MWF before lasix in AM take metolazone 2.5 mg and then come back to see me on Tuesday next week.  Will see if this improves edema.

## 2016-05-31 ENCOUNTER — Encounter: Payer: Self-pay | Admitting: Vascular Surgery

## 2016-05-31 ENCOUNTER — Ambulatory Visit (INDEPENDENT_AMBULATORY_CARE_PROVIDER_SITE_OTHER): Payer: Self-pay | Admitting: Vascular Surgery

## 2016-05-31 VITALS — BP 109/63 | HR 85 | Temp 97.7°F | Resp 18 | Ht 65.0 in

## 2016-05-31 DIAGNOSIS — Z95828 Presence of other vascular implants and grafts: Secondary | ICD-10-CM

## 2016-05-31 DIAGNOSIS — I739 Peripheral vascular disease, unspecified: Secondary | ICD-10-CM

## 2016-05-31 NOTE — Progress Notes (Signed)
Subjective:     Patient ID: Sarah Moss, female   DOB: Dec 04, 1940, 76 y.o.   MRN: [id number]  HPI Sarah Moss follows up for recent evaluation of left foot wound. She had bilateral iliac artery stents placed in March and was noted to have an SFA occlusion at the time. At last visit she also had a wound on the dorsum of her foot. She also has a previous traumatic right below-knee amputation. She remains in a significant and remains on Coumadin and aspirin. The wound has subsequently healed with control of her edema by Lasix. She is not back to walking yet that she cannot wear a prosthetic on the right which she is using her leg for transfers. She is not having any fevers or chills and is doing well overall at this time.   Review of Systems No complaints today    Objective:   Physical Exam aaox3 Non labored respirations Bilateral 2+ femoral pulses Monophasic peroneal, pt signals on left Healed dorsum of foot wound on left    Assessment/plan     76 year old female follows up from bilateral iliac artery stenting with now a healed wound on her left foot. She has a known left SFA occlusion and at this time does not need intervention. She should continue her anticoagulation as well as aspirin. We discussed protecting her left foot specifically from her wheelchair. The daughter is attempting to get her wheelchair with a foot rest and if she needs prescription from [place] she can call with specific details. We will otherwise plan to see her in 6 months with aortoiliac duplex as well as ABIs at that time. All questions were answered we will see her in 6 months.     Sharon Stapel C. Donzetta Matters, MD [facility] Office: [phone] Pager: [phone]

## 2016-06-04 ENCOUNTER — Ambulatory Visit (INDEPENDENT_AMBULATORY_CARE_PROVIDER_SITE_OTHER): Payer: Medicare HMO | Admitting: Cardiology

## 2016-06-04 ENCOUNTER — Encounter: Payer: Self-pay | Admitting: Cardiology

## 2016-06-04 VITALS — BP 118/72 | HR 70 | Ht 65.0 in | Wt 126.6 lb

## 2016-06-04 DIAGNOSIS — Z7901 Long term (current) use of anticoagulants: Secondary | ICD-10-CM

## 2016-06-04 DIAGNOSIS — Z89511 Acquired absence of right leg below knee: Secondary | ICD-10-CM

## 2016-06-04 DIAGNOSIS — I482 Chronic atrial fibrillation: Secondary | ICD-10-CM

## 2016-06-04 DIAGNOSIS — I270 Primary pulmonary hypertension: Secondary | ICD-10-CM | POA: Diagnosis not present

## 2016-06-04 DIAGNOSIS — N183 Chronic kidney disease, stage 3 unspecified: Secondary | ICD-10-CM

## 2016-06-04 DIAGNOSIS — I739 Peripheral vascular disease, unspecified: Secondary | ICD-10-CM

## 2016-06-04 DIAGNOSIS — I4821 Permanent atrial fibrillation: Secondary | ICD-10-CM

## 2016-06-04 DIAGNOSIS — I5023 Acute on chronic systolic (congestive) heart failure: Secondary | ICD-10-CM

## 2016-06-04 MED ORDER — METOLAZONE 2.5 MG PO TABS: 2.5000 mg | ORAL_TABLET | Freq: Every day | ORAL | 3 refills | Status: DC

## 2016-06-04 MED ORDER — FUROSEMIDE 80 MG PO TABS: 80.0000 mg | ORAL_TABLET | Freq: Every day | ORAL | 3 refills | Status: DC

## 2016-06-04 NOTE — Patient Instructions (Signed)
Medication Instructions:  Your physician has recommended you make the following change in your medication: 1.) START Lasix 80 mg daily. 2.) START metolazone 2.5 mg on Monday AND Friday.  Labwork: Your physician recommends that you return for lab work today for BMET  Testing/Procedures: None Ordered   Follow-Up: With Dr. Marlou Porch as scheduled.   Any Other Special Instructions Will Be Listed Below (If Applicable).     If you need a refill on your cardiac medications before your next appointment, please call your pharmacy.  Thank you for choosing [facility]

## 2016-06-04 NOTE — Progress Notes (Signed)
Cardiology Office Note   Date:  06/04/2016   ID:  Sarah Moss, DOB [birth year], MRN [id number]  PCP:  Wenda Low, MD  Cardiologist:  Dr. Marlou Porch    Chief Complaint  Patient presents with  . Leg Swelling  . Congestive Heart Failure      History of Present Illness: Sarah Moss is a 76 y.o. female who presents for CHF and edema, med adjustment  [name] of 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 Marlou Porch for post hospital follow-up after being admitted with left ventricular dysfunction, shortness of breath, acute systolic heart failure. She had also been hospitalized in 12/2015 at [facility] for CHF.  In 09/2015 echo with EF 50-55%, with mod MR and TR with mod. Pul. HTN. On last visit lasix increased to 80 mg BID but edema and wt have gone up.    She has had stent to LCIA and RCIA by Dr. Trula Slade.   Echo was done with new EF 20%,  Other hx of single kidney and stent placed in 2006 to renal artery.  Now on BB, lasix increased, plan to titrate meds and repeat echo in 3 months.    Last visit with continued edema we added metolazone 2.5 mg MWF, after discussing with Dr. Marlou Porch. Continued the the lasix 80 BID.  Needs BMP today.  We increased her coreg to 12.5 mg BID and today HR is 70 down from 82.    Today she is down > than 32 lbs because she is weighing with her prosthesis on today.  She has brighter affect, she walked around the room with her prosthesis on Rt and did well.  She interacts more as well.    Past Medical History:  Diagnosis Date  . Asthma   . CHF (congestive heart failure) ([place])   . COPD (chronic obstructive pulmonary disease) ([place])   . Diabetes mellitus without complication ([place])   . GERD  (gastroesophageal reflux disease)   . Hx of right BKA ([place])   . Hypertension     Past Surgical History:  Procedure Laterality Date  . LOWER EXTREMITY ANGIOGRAPHY N/A 03/19/2016   Procedure: Lower Extremity Angiography;  Surgeon: Serafina Mitchell, MD;  Location: [facility];  Service: Cardiovascular;  Laterality: N/A;  . PERIPHERAL VASCULAR INTERVENTION Bilateral 03/19/2016   Procedure: Peripheral Vascular Intervention;  Surgeon: Serafina Mitchell, MD;  Location: [facility];  Service: Cardiovascular;  Laterality: Bilateral;  iliacs     Current Outpatient Prescriptions  Medication Sig Dispense Refill  . ADVAIR DISKUS 250-50 MCG/DOSE AEPB Inhale 1 puff into the lungs 2 (two) times daily.    Marland Kitchen ALPRAZolam (XANAX) 0.25 MG tablet Take 1 tablet (0.25 mg total) by mouth every 12 (twelve) hours as needed for anxiety. 15 tablet 0  . aspirin EC 81 MG EC tablet Take 1 tablet (81 mg total) by mouth daily.    Marland Kitchen atorvastatin (LIPITOR) 40 MG tablet Take 40 mg by mouth daily.    . carvedilol (COREG) 12.5 MG tablet Take 1 tablet (12.5 mg total) by mouth 2 (two) times daily with a meal. 60 tablet 9  . Cholecalciferol (D3-50) 50000 units capsule Take 50,000 Units by mouth daily.    . DULoxetine (CYMBALTA) 60 MG capsule Take 60 mg by  mouth daily.    . furosemide (LASIX) 40 MG tablet Take 2 tablets (80 mg total) by mouth 2 (two) times daily. 90 tablet 3  . gabapentin (NEURONTIN) 300 MG capsule Take 300 mg by mouth at bedtime.    . iron polysaccharides (NIFEREX) 150 MG capsule Take 1 capsule (150 mg total) by mouth daily.    Marland Kitchen levothyroxine (SYNTHROID, LEVOTHROID) 25 MCG tablet Take 25 mcg by mouth daily.    Marland Kitchen lisinopril (PRINIVIL,ZESTRIL) 20 MG tablet Take 20 mg by mouth daily.    . metFORMIN (GLUCOPHAGE) 1000 MG tablet Take 1,000 mg by mouth 2 (two) times daily.    . metolazone (ZAROXOLYN) 2.5 MG tablet Take 1 tablet (2.5 mg total) by mouth every Monday, Wednesday, and Friday. 12 tablet 3  .  montelukast (SINGULAIR) 10 MG tablet Take 10 mg by mouth at bedtime.    . nitroGLYCERIN (NITROSTAT) 0.4 MG SL tablet Place 0.4 mg under the tongue every 5 (five) minutes as needed for chest pain.    Marland Kitchen omeprazole (PRILOSEC) 40 MG capsule Take 40 mg by mouth daily.    Marland Kitchen oxyCODONE-acetaminophen (PERCOCET/ROXICET) 5-325 MG tablet Take 1 tablet by mouth every 4 (four) hours as needed for severe pain.    . polyethylene glycol (MIRALAX / GLYCOLAX) packet Take 17 g by mouth 2 (two) times daily. 14 each 0  . senna-docusate (SENOKOT-S) 8.6-50 MG tablet Take 1 tablet by mouth 2 (two) times daily.    Marland Kitchen spironolactone (ALDACTONE) 25 MG tablet Take 25 mg by mouth daily.    . VENTOLIN HFA 108 (90 Base) MCG/ACT inhaler Inhale 1 puff into the lungs every 6 (six) hours as needed for shortness of breath.     . warfarin (COUMADIN) 4 MG tablet Take 4-6 mg by mouth daily. Patient alternates between 4 mg and 6 mg daily. Patient had 4 mg on 03/17/16     No current facility-administered medications for this visit.     Allergies:   Codeine; Ativan [lorazepam]; and Ambien [zolpidem]    Social History:  The patient  reports that she has quit smoking. She has never used smokeless tobacco. She reports that she does not drink alcohol or use drugs.   Family History:  The patient's family history includes Cancer in her father; Diabetes in her mother; Stroke in her mother.    ROS:  General:no colds or fevers, + weight loss of greater than 32 lbs.   Skin:no rashes or ulcers HEENT:no blurred vision, no congestion CV:see HPI PUL:see HPI GI:no diarrhea constipation or melena, no indigestion GU:no hematuria, no dysuria MS:no joint pain, no claudication, lt foot ulcer has healed, edema resolved Neuro:no syncope, no lightheadedness Endo:+ diabetes stable.,  no thyroid disease  Wt Readings from Last 3 Encounters:  06/04/16 126 lb 9.6 oz (57.4 kg)  05/27/16 158 lb (71.7 kg)  05/10/16 148 lb (67.1 kg)     PHYSICAL  EXAM: VS:  BP 118/72   Pulse 70   Ht 5\' 5"  (1.651 m)   Wt 126 lb 9.6 oz (57.4 kg)   LMP  (LMP Unknown)   BMI 21.07 kg/m  , BMI Body mass index is 21.07 kg/m. General:Pleasant affect, NAD Skin:Warm and dry, brisk capillary refill HEENT:normocephalic, sclera clear, mucus membranes moist Neck:supple, no JVD, no bruits  Heart:irreg irreg  without murmur, gallup, rub or click Lungs:clear without rales, rhonchi, or wheezes YSA:YTKZ, non tender, + BS, do not palpate liver spleen or masses Ext:no lower ext edema,  Rt stump without  edema, 2+ radial pulses Neuro:alert and oriented X 3, MAE, follows commands, + facial symmetry    EKG:  EKG is not ordered today.   Recent Labs: 03/18/2016: B Natriuretic Peptide 765.5; TSH 4.251 03/19/2016: ALT 15 03/21/2016: Magnesium 2.1 03/23/2016: Hemoglobin 8.4; Platelets 185 05/27/2016: BUN 29; Creatinine, Ser 1.15; Potassium 4.3; Sodium 141    Lipid Panel No results found for: CHOL, TRIG, HDL, CHOLHDL, VLDL, LDLCALC, LDLDIRECT     Other studies Reviewed: Additional studies/ records that were reviewed today include: . Echo 03/20/16  Study Conclusions  - Left ventricle: The cavity size was normal. Wall thickness was normal. Systolic function was severely reduced. The estimated ejection fraction was in the range of 20% to 25%. Diffuse hypokinesis. Doppler parameters are consistent with both elevated ventricular end-diastolic filling pressure and elevated left atrial filling pressure. - Aortic valve: Valve area (VTI): 0.63 cm^2. Valve area (Vmax): 0.89 cm^2. Valve area (Vmean): 0.75 cm^2. - Mitral valve: Calcified annulus. Mildly thickened leaflets . There was mild regurgitation. - Right ventricle: The cavity size was moderately dilated. - Right atrium: The atrium was moderately dilated. - Atrial septum: No defect or patent foramen ovale was identified. - Tricuspid valve: There was moderate-severe regurgitation. - Pulmonary arteries:  PA peak pressure: 39 mm Hg (S). - Pericardium, extracardiac: There was a left pleural effusion.  PV angio Impression: #1 patent right renal stent. Greater than 50% left renal artery stenosis #2 occluded right common iliac artery which was able to be recanalized and stented using an Atrium 8 x 38. #3 significant in-stent stenosis within the left common iliac artery stented using a Atrium 7 x 38 with residual stenosis less than 10% #4 occlusion of the left superficial femoral artery with above-knee popliteal artery reconstitution and three-vessel runoff via 3 very small tibial vessels #5 unclear if dissection into the right common iliac and aorta was iatrogenic or pre-existing. It was resolved after intervention   ASSESSMENT AND PLAN:  1.  Acute on chronic HF now resolved, no SOB, no edema feels well.  Wt down > than 32 lbs.  Check Bmet today Decrease lasix to 80 mg once a day and metolazone 2.5 mg Monday and Fridays - if labs reveal increased CKD then will stop metolazone.   2. Cardiomyopathy on BB now at 12.5 BID - HR at 70   3. PAD has seen vascular Dr. Donzetta Matters and doing well. Lt foot ulcer has healed.   4. BKA rt. Now able to wear prosthesis, she is happy to have some independence.  5. Permanent a fib on coumadin.  Rate controlled.  6. Single kidney monitor BMP  7. Melanoma needs surgery that will require an admit, will have Dr. Marlou Porch clear her if stable on next visit.    Current medicines are reviewed with the patient today.  The patient Has no concerns regarding medicines.  The following changes have been made:  See above Labs/ tests ordered today include:see above  Disposition:   FU:  see above  Signed, Cecilie Kicks, NP  06/04/2016 2:52 PM    [facility] [address], [place], [address], [place] Phone: [phone]; Fax: [phone]

## 2016-06-05 LAB — BASIC METABOLIC PANEL
BUN/Creatinine Ratio: 27 (ref 12–28)
BUN: 35 mg/dL — AB (ref 8–27)
CALCIUM: 10.2 mg/dL (ref 8.7–10.3)
CHLORIDE: 88 mmol/L — AB (ref 96–106)
CO2: 27 mmol/L (ref 18–29)
Creatinine, Ser: 1.3 mg/dL — ABNORMAL HIGH (ref 0.57–1.00)
GFR calc non Af Amer: 40 mL/min/{1.73_m2} — ABNORMAL LOW (ref >59)
GFR, EST AFRICAN AMERICAN: 46 mL/min/{1.73_m2} — AB (ref >59)
GLUCOSE: 126 mg/dL — AB (ref 65–99)
Potassium: 4.5 mmol/L (ref 3.5–5.2)
Sodium: 140 mmol/L (ref 134–144)

## 2016-06-05 NOTE — Addendum Note (Signed)
Addended by: Lianne Cure A on: 06/05/2016 01:16 PM   Modules accepted: Orders

## 2016-06-06 ENCOUNTER — Telehealth: Payer: Self-pay | Admitting: Cardiology

## 2016-06-06 NOTE — Telephone Encounter (Signed)
-----   Message from Isaiah Serge, NP sent at 06/05/2016  8:36 AM EDT ----- Labs reflect diuresing, we decreased diuretics yesterday and pt needs BMP in 2 weeks.

## 2016-06-06 NOTE — Telephone Encounter (Signed)
Follow Up: Sarah Moss returned your call,she said to please be suret the nurse at [facility] gets the results.

## 2016-06-06 NOTE — Telephone Encounter (Signed)
Suanne Marker, LPN @ [facility] has been made aware of pts lab results and she will recheck bmp 06/19/16 and fax [place] the results.

## 2016-06-27 ENCOUNTER — Encounter: Payer: Self-pay | Admitting: Cardiology

## 2016-06-27 ENCOUNTER — Ambulatory Visit (INDEPENDENT_AMBULATORY_CARE_PROVIDER_SITE_OTHER): Payer: Medicare HMO | Admitting: Cardiology

## 2016-06-27 VITALS — BP 110/70 | HR 79 | Ht 65.0 in | Wt 137.0 lb

## 2016-06-27 DIAGNOSIS — Z79899 Other long term (current) drug therapy: Secondary | ICD-10-CM

## 2016-06-27 DIAGNOSIS — I4821 Permanent atrial fibrillation: Secondary | ICD-10-CM

## 2016-06-27 DIAGNOSIS — I509 Heart failure, unspecified: Secondary | ICD-10-CM

## 2016-06-27 DIAGNOSIS — I5022 Chronic systolic (congestive) heart failure: Secondary | ICD-10-CM | POA: Diagnosis not present

## 2016-06-27 DIAGNOSIS — I739 Peripheral vascular disease, unspecified: Secondary | ICD-10-CM

## 2016-06-27 DIAGNOSIS — I482 Chronic atrial fibrillation: Secondary | ICD-10-CM

## 2016-06-27 DIAGNOSIS — I270 Primary pulmonary hypertension: Secondary | ICD-10-CM

## 2016-06-27 NOTE — Patient Instructions (Signed)
Medication Instructions:  The current medical regimen is effective;  continue present plan and medications.  Labwork: Please have blood work today.  Follow-Up: Follow up in 4 months with Cecilie Kicks, NP.  You will receive a letter in the mail 2 months before you are due.  Please call us when you receive this letter to schedule your follow up appointment.  If you need a refill on your cardiac medications before your next appointment, please call your pharmacy.  Thank you for choosing [facility]!!

## 2016-06-27 NOTE — Progress Notes (Signed)
Cardiology Office Note:    Date:  06/27/2016   ID:  Sarah Moss, DOB 1940/08/14, MRN [id number]  PCP:  Wenda Low, MD  Cardiologist:  Candee Furbish, MD     Referring MD: No ref. provider found    History of Present Illness:    Sarah Moss is a 75 y.o. female with a hx of Sick Jude pacemaker placement in December 2015 with right BKA from accident, peripheral arterial disease status post left superficial femoral artery stenting, diabetes with hypertension, atrial fibrillation with tachycardia-bradycardia syndrome on chronic anticoagulation with Coumadin here for post hospital follow-up after being admitted with left ventricular dysfunction, shortness of breath, acute systolic heart failure.  In review of my consult note from the hospital on 03/22/16 she received care from Dr. [name] in [facility]. She was recently discharged from [facility] on December 2017 for CHF exacerbation. Prior to that in September 2017 her echocardiogram showed EF of 50-55% with moderate mitral regurgitation and tricuspid regurgitation with moderate pulmonary hypertension.  She was seen by vascular surgery, Dr. Trula Slade, ABI suggested significant PVD and she underwent peripheral vascular angiogram with stent to her left common iliac artery and right common iliac artery. An echo was performed and demonstrated EF with apparently new ejection fraction of 20%. At that time, she was unable to give me any details of past medical history, describing "how did I get here ". She was not oriented. She did however deny any dyspnea or chest pain.  She was seen by Rosaria Ferries here in our office on 04/02/16 posthospitalization she was getting physical therapy at the time. She was having dyspnea on exertion at that time unable to get to the bedroom to bathroom without shortness of breath. [facility].  By history, he was also noted that she has a single kidney and stent placed in 2006 by Dr. early. Her Lasix was increased  to 40 mg twice a day to 80 mg a.m. 40 mg p.m. for 3 days after that visit.  She also saw Dr. Barry Dienes of general surgery. She had a melanoma excised from her mid chest wall and they would like to go back in to take out more tissue. Please see below.  06/27/16-she is done much better after diuresis. Lost approximate 32 pounds. She had an very aggressive diuresis with metolazone. Be careful. She is now off this medication. Continue with Lasix once a day. Very happy with results. Breathing well. Able to get prosthesis back on.  Past Medical History:  Diagnosis Date  . Asthma   . CHF (congestive heart failure) ([place])   . COPD (chronic obstructive pulmonary disease) ([place])   . Diabetes mellitus without complication ([place])   . GERD (gastroesophageal reflux disease)   . Hx of right BKA ([place])   . Hypertension     Past Surgical History:  Procedure Laterality Date  . LOWER EXTREMITY ANGIOGRAPHY N/A 03/19/2016   Procedure: Lower Extremity Angiography;  Surgeon: Serafina Mitchell, MD;  Location: [facility];  Service: Cardiovascular;  Laterality: N/A;  . PERIPHERAL VASCULAR INTERVENTION Bilateral 03/19/2016   Procedure: Peripheral Vascular Intervention;  Surgeon: Serafina Mitchell, MD;  Location: [facility];  Service: Cardiovascular;  Laterality: Bilateral;  iliacs    Current Medications: Current Meds  Medication Sig  . ADVAIR DISKUS 250-50 MCG/DOSE AEPB Inhale 1 puff into the lungs 2 (two) times daily.  Marland Kitchen ALPRAZolam (XANAX) 0.25 MG tablet Take 1 tablet (0.25 mg total) by mouth every 12 (twelve)  hours as needed for anxiety.  Marland Kitchen aspirin EC 81 MG EC tablet Take 1 tablet (81 mg total) by mouth daily.  Marland Kitchen atorvastatin (LIPITOR) 40 MG tablet Take 40 mg by mouth daily.  . carvedilol (COREG) 12.5 MG tablet Take 1 tablet (12.5 mg total) by mouth 2 (two) times daily with a meal.  . Cholecalciferol (D3-50) 50000 units capsule Take 50,000 Units by mouth 2 (two) times a week.   . DULoxetine (CYMBALTA) 60 MG  capsule Take 60 mg by mouth daily.  . furosemide (LASIX) 80 MG tablet Take 1 tablet (80 mg total) by mouth daily.  Marland Kitchen gabapentin (NEURONTIN) 300 MG capsule Take 300 mg by mouth at bedtime.  . iron polysaccharides (NIFEREX) 150 MG capsule Take 1 capsule (150 mg total) by mouth daily.  Marland Kitchen levothyroxine (SYNTHROID, LEVOTHROID) 25 MCG tablet Take 25 mcg by mouth daily.  Marland Kitchen lisinopril (PRINIVIL,ZESTRIL) 20 MG tablet Take 20 mg by mouth daily.  . metFORMIN (GLUCOPHAGE) 1000 MG tablet Take 1,000 mg by mouth 2 (two) times daily.  . metolazone (ZAROXOLYN) 2.5 MG tablet Take 2.5 mg by mouth 2 (two) times a week.  . montelukast (SINGULAIR) 10 MG tablet Take 10 mg by mouth at bedtime.  . nitroGLYCERIN (NITROSTAT) 0.4 MG SL tablet Place 0.4 mg under the tongue every 5 (five) minutes as needed for chest pain.  Marland Kitchen omeprazole (PRILOSEC) 40 MG capsule Take 40 mg by mouth daily.  Marland Kitchen oxyCODONE-acetaminophen (PERCOCET/ROXICET) 5-325 MG tablet Take 1 tablet by mouth every 4 (four) hours as needed for severe pain.  . polyethylene glycol (MIRALAX / GLYCOLAX) packet Take 17 g by mouth 2 (two) times daily.  Marland Kitchen senna-docusate (SENOKOT-S) 8.6-50 MG tablet Take 1 tablet by mouth 2 (two) times daily.  Marland Kitchen spironolactone (ALDACTONE) 25 MG tablet Take 12.5 mg by mouth daily.   . VENTOLIN HFA 108 (90 Base) MCG/ACT inhaler Inhale 1 puff into the lungs every 6 (six) hours as needed for shortness of breath.   . warfarin (COUMADIN) 4 MG tablet Take 4-6 mg by mouth daily. Patient alternates between 4 mg and 6 mg daily. Patient had 4 mg on 03/17/16     Allergies:   Codeine; Ativan [lorazepam]; and Ambien [zolpidem]   Social History   Social History  . Marital status: Married    Spouse name: N/A  . Number of children: N/A  . Years of education: N/A   Social History Main Topics  . Smoking status: Former Research scientist (life sciences)  . Smokeless tobacco: Never Used  . Alcohol use No  . Drug use: No  . Sexual activity: Not Asked   Other Topics Concern    . None   Social History Narrative  . None     family history includes Cancer in her father; Diabetes in her mother; Stroke in her mother. ROS:   Please see the history of present illness.   Denies any bleeding, syncope, orthopnea, PND  All other systems reviewed and are negative.   EKGs/Labs/Other Studies Reviewed:    EKG:  Paced  Recent Labs: 03/18/2016: B Natriuretic Peptide 765.5; TSH 4.251 03/19/2016: ALT 15 03/21/2016: Magnesium 2.1 03/23/2016: Hemoglobin 8.4; Platelets 185 06/04/2016: BUN 35; Creatinine, Ser 1.30; Potassium 4.5; Sodium 140   Recent Lipid Panel No results found for: CHOL, TRIG, HDL, CHOLHDL, VLDL, LDLCALC, LDLDIRECT  Physical Exam:    VS:  BP 110/70   Pulse 79   Ht 5\' 5"  (1.651 m)   Wt 137 lb (62.1 kg)   LMP  (LMP  Unknown)   BMI 22.80 kg/m     Wt Readings from Last 3 Encounters:  06/27/16 137 lb (62.1 kg)  06/04/16 126 lb 9.6 oz (57.4 kg)  05/27/16 158 lb (71.7 kg)     GEN:  Well nourished, well developed in no acute distress, In wheelchair HEENT: Normal NECK: Mid neck JVD; No carotid bruits LYMPHATICS: No lymphadenopathy CARDIAC: RRR, no murmurs, rubs, gallops RESPIRATORY:  Clear to auscultation without rales, wheezing or rhonchi  ABDOMEN: Soft, non-tender, non-distended Right upper quadrant surgical scar noted MUSCULOSKELETAL:   right BKA, trace edema, distal pulses reduced  SKIN: Edema is much improved. Dorsum of foot wound improved. NEUROLOGIC:  Alert but not oriented. Pleasant. PSYCHIATRIC:  Dementia noted   CXR 2 view 03/18/16 IMPRESSION: Central mild vascular congestion without convincing pulmonary edema. Stable trace bilateral pleural effusion with basilar atelectasis. No segmental infiltrates.  Echo 03/20/16 Study Conclusions  - Left ventricle: The cavity size was normal. Wall thickness was normal. Systolic function was severely reduced. The estimated ejection fraction was in the range of 20% to 25%. Diffuse hypokinesis.  Doppler parameters are consistent with both elevated ventricular end-diastolic filling pressure and elevated left atrial filling pressure. - Aortic valve: Valve area (VTI): 0.63 cm^2. Valve area (Vmax): 0.89 cm^2. Valve area (Vmean): 0.75 cm^2. - Mitral valve: Calcified annulus. Mildly thickened leaflets . There was mild regurgitation. - Right ventricle: The cavity size was moderately dilated. - Right atrium: The atrium was moderately dilated. - Atrial septum: No defect or patent foramen ovale was identified. - Tricuspid valve: There was moderate-severe regurgitation. - Pulmonary arteries: PA peak pressure: 39 mm Hg (S). - Pericardium, extracardiac: There was a left pleural effusion.   ASSESSMENT:    1. Chronic heart failure, unspecified heart failure type ([place])   2. Long-term use of high-risk medication   3. Pulmonary hypertension, primary ([place])   4. Permanent atrial fibrillation ([place])   5. PAD (peripheral artery disease) ([place])   6. Chronic systolic heart failure (HCC)    PLAN:    In order of problems listed above:  Acute on Chronic systolic heart failure/dilated cardiomyopathy  - Newly reduced ejection fraction of 20% with global hypokinesis, in September 2017 EF was 50% at [facility].  - During hospitalization we stopped diltiazem and started carvedilol. Continue ACE inhibitor lisinopril.   - We will continue with carvedilol 12.5 mg twice a day   -  Lasix to 80 mg once a day.  She had very aggressive diuresis with metolazone, 32 pounds.  Peripheral vascular disease  - Dr. Trula Slade, common iliac stenting  Permanent atrial fibrillation  - Coumadin therapy  - Pacemaker interrogation showed permanent, 100% atrial fibrillation when reviewed by Dr. Loletha Grayer. no changes.  Pacemaker St. Jude  - She is an appointment upcoming with Dr. Caryl Comes in June. We will have him determine if she needs her merlin bedside device at [facility] at that time. She has been without it for the  past 2 years. Ritta Slot is reluctant to look it up without official EP order. No changes  Amputated right BKA.  Peripheral vascular disease with wound/blister left foot  - Seen by Dr. Servando Snare   - She was offered either bypass surgery versus repeat endovascular evaluation with possible intervention versus waiting to see if wound heals. Sister and patient stated that they had a lot going on in their life and desired to wait. They are going to follow-up and if the wound has not healed there will pursue endovascular intervention.  Should it worsen, they can perform a procedure on a more urgent basis. The wound was described as 5.5 x 3 cm wound to dorsum of left foot without erythema. The wound is improving. Much improved..  Preoperative cardiovascular risk stratification  - Dr. Barry Dienes performed melanoma excision. She would like to go under general anesthesia to remove further tissue around her chest wall. I still feel given her comorbidities that she would be of high risk for general anesthesia for any elective procedure. If surgery is felt to be emergent or urgent, please let me know.  Dementia  - Her sister does state that she has increasing worsening baseline dementia. She was not oriented to place or time.  Medication Adjustments/Labs and Tests Ordered: Current medicines are reviewed at length with the patient today.  Concerns regarding medicines are outlined above. Labs and tests ordered and medication changes are outlined in the patient instructions below:  Patient Instructions  Medication Instructions:  The current medical regimen is effective;  continue present plan and medications.  Labwork: Please have blood work today.  Follow-Up: Follow up in 4 months with Cecilie Kicks, NP.  You will receive a letter in the mail 2 months before you are due.  Please call us when you receive this letter to schedule your follow up appointment.  If you need a refill on your cardiac medications  before your next appointment, please call your pharmacy.  Thank you for choosing [facility]!!        Signed, Candee Furbish, MD  06/27/2016 5:27 PM    [facility]

## 2016-06-28 LAB — BASIC METABOLIC PANEL
BUN / CREAT RATIO: 33 — AB (ref 12–28)
BUN: 42 mg/dL — ABNORMAL HIGH (ref 8–27)
CO2: 27 mmol/L (ref 20–29)
CREATININE: 1.27 mg/dL — AB (ref 0.57–1.00)
Calcium: 9.5 mg/dL (ref 8.7–10.3)
Chloride: 90 mmol/L — ABNORMAL LOW (ref 96–106)
GFR calc Af Amer: 47 mL/min/{1.73_m2} — ABNORMAL LOW (ref >59)
GFR, EST NON AFRICAN AMERICAN: 41 mL/min/{1.73_m2} — AB (ref >59)
Glucose: 88 mg/dL (ref 65–99)
POTASSIUM: 5 mmol/L (ref 3.5–5.2)
SODIUM: 136 mmol/L (ref 134–144)

## 2016-07-11 ENCOUNTER — Ambulatory Visit (INDEPENDENT_AMBULATORY_CARE_PROVIDER_SITE_OTHER): Payer: Medicare HMO | Admitting: Internal Medicine

## 2016-07-11 VITALS — BP 122/72 | HR 80 | Ht 65.0 in | Wt 133.4 lb

## 2016-07-11 DIAGNOSIS — I482 Chronic atrial fibrillation: Secondary | ICD-10-CM | POA: Diagnosis not present

## 2016-07-11 DIAGNOSIS — I495 Sick sinus syndrome: Secondary | ICD-10-CM | POA: Diagnosis not present

## 2016-07-11 DIAGNOSIS — I420 Dilated cardiomyopathy: Secondary | ICD-10-CM | POA: Diagnosis not present

## 2016-07-11 DIAGNOSIS — Z950 Presence of cardiac pacemaker: Secondary | ICD-10-CM | POA: Diagnosis not present

## 2016-07-11 DIAGNOSIS — I5022 Chronic systolic (congestive) heart failure: Secondary | ICD-10-CM | POA: Diagnosis not present

## 2016-07-11 DIAGNOSIS — I4821 Permanent atrial fibrillation: Secondary | ICD-10-CM

## 2016-07-11 LAB — CUP PACEART INCLINIC DEVICE CHECK
Battery Remaining Longevity: 106 mo
Brady Statistic RV Percent Paced: 99.13 %
Implantable Lead Implant Date: 20161205
Implantable Lead Location: 753860
Implantable Pulse Generator Implant Date: 20161205
Lead Channel Impedance Value: 450 Ohm
Lead Channel Pacing Threshold Amplitude: 0.5 V
Lead Channel Pacing Threshold Pulse Width: 0.4 ms
Lead Channel Setting Pacing Amplitude: 2.5 V
Lead Channel Setting Pacing Pulse Width: 0.4 ms
MDC IDC LEAD IMPLANT DT: 20161205
MDC IDC LEAD LOCATION: 753859
MDC IDC MSMT BATTERY VOLTAGE: 2.98 V
MDC IDC MSMT LEADCHNL RA IMPEDANCE VALUE: 412.5 Ohm
MDC IDC MSMT LEADCHNL RA SENSING INTR AMPL: 0.7 mV
MDC IDC MSMT LEADCHNL RV PACING THRESHOLD AMPLITUDE: 0.5 V
MDC IDC MSMT LEADCHNL RV PACING THRESHOLD PULSEWIDTH: 0.4 ms
MDC IDC MSMT LEADCHNL RV SENSING INTR AMPL: 12 mV
MDC IDC PG SERIAL: 7843315
MDC IDC SESS DTM: 20180628162042
MDC IDC SET LEADCHNL RV SENSING SENSITIVITY: 4 mV
MDC IDC STAT BRADY RA PERCENT PACED: 10 %
Pulse Gen Model: 2240

## 2016-07-11 NOTE — Progress Notes (Signed)
Patient Care Team: Wenda Low, MD as PCP - General (Internal Medicine)   HPI  Sarah Moss is a 76 y.o. female Seen in follow-up for pacemaker implanted in [facility]. She was hospitalized 3/18 with acute congestive heart failure noted to have an interval decrease in LV function from 55--20% heavily associated with pacemaker implantation.  She was diuresed significantly with a 32 pound weight loss.  She has atrial fibrillation and is status post AV ablation. She is on Coumadin.  She has dementia. When seen by Dr. Marlou Porch 2 weeks ago she was noted to be not oriented to time or place.  She is aware of her history of CHF and fluid accumulation   Denies SOB or edema or CP at this point    Records and Results Reviewed notes from providers and hospital  Date K Hgb  3/18   8.4  6/18 5.0       Past Medical History:  Diagnosis Date  . Asthma   . CHF (congestive heart failure) ([place])   . COPD (chronic obstructive pulmonary disease) ([place])   . Diabetes mellitus without complication ([place])   . GERD (gastroesophageal reflux disease)   . Hx of right BKA ([place])   . Hypertension     Past Surgical History:  Procedure Laterality Date  . LOWER EXTREMITY ANGIOGRAPHY N/A 03/19/2016   Procedure: Lower Extremity Angiography;  Surgeon: Serafina Mitchell, MD;  Location: [facility];  Service: Cardiovascular;  Laterality: N/A;  . PERIPHERAL VASCULAR INTERVENTION Bilateral 03/19/2016   Procedure: Peripheral Vascular Intervention;  Surgeon: Serafina Mitchell, MD;  Location: [facility];  Service: Cardiovascular;  Laterality: Bilateral;  iliacs    Current Outpatient Prescriptions  Medication Sig Dispense Refill  . ADVAIR DISKUS 250-50 MCG/DOSE AEPB Inhale 1 puff into the lungs 2 (two) times daily.    Marland Kitchen ALPRAZolam (XANAX) 0.25 MG tablet Take 1 tablet (0.25 mg total) by mouth every 12 (twelve) hours as needed for anxiety. 15 tablet 0  . aspirin EC 81 MG EC tablet Take 1 tablet  (81 mg total) by mouth daily.    Marland Kitchen atorvastatin (LIPITOR) 40 MG tablet Take 40 mg by mouth daily.    . carvedilol (COREG) 12.5 MG tablet Take 1 tablet (12.5 mg total) by mouth 2 (two) times daily with a meal. 60 tablet 9  . Cholecalciferol (D3-50) 50000 units capsule Take 50,000 Units by mouth 2 (two) times a week.     . DULoxetine (CYMBALTA) 60 MG capsule Take 60 mg by mouth daily.    . furosemide (LASIX) 80 MG tablet Take 1 tablet (80 mg total) by mouth daily. 90 tablet 3  . gabapentin (NEURONTIN) 300 MG capsule Take 300 mg by mouth at bedtime.    . iron polysaccharides (NIFEREX) 150 MG capsule Take 1 capsule (150 mg total) by mouth daily.    Marland Kitchen levothyroxine (SYNTHROID, LEVOTHROID) 25 MCG tablet Take 25 mcg by mouth daily.    Marland Kitchen lisinopril (PRINIVIL,ZESTRIL) 20 MG tablet Take 20 mg by mouth daily.    . metFORMIN (GLUCOPHAGE) 1000 MG tablet Take 1,000 mg by mouth 2 (two) times daily.    . metolazone (ZAROXOLYN) 2.5 MG tablet Take 2.5 mg by mouth 2 (two) times a week.    . montelukast (SINGULAIR) 10 MG tablet Take 10 mg by mouth at bedtime.    . nitroGLYCERIN (NITROSTAT) 0.4 MG SL tablet Place 0.4 mg under the tongue every 5 (five) minutes as  needed for chest pain.    Marland Kitchen omeprazole (PRILOSEC) 40 MG capsule Take 40 mg by mouth daily.    Marland Kitchen oxyCODONE-acetaminophen (PERCOCET/ROXICET) 5-325 MG tablet Take 1 tablet by mouth every 4 (four) hours as needed for severe pain.    . polyethylene glycol (MIRALAX / GLYCOLAX) packet Take 17 g by mouth 2 (two) times daily. 14 each 0  . senna-docusate (SENOKOT-S) 8.6-50 MG tablet Take 1 tablet by mouth 2 (two) times daily.    Marland Kitchen spironolactone (ALDACTONE) 25 MG tablet Take 12.5 mg by mouth daily.     . VENTOLIN HFA 108 (90 Base) MCG/ACT inhaler Inhale 1 puff into the lungs every 6 (six) hours as needed for shortness of breath.     . warfarin (COUMADIN) 4 MG tablet Take 4-6 mg by mouth daily. Patient alternates between 4 mg and 6 mg daily. Patient had 4 mg on 03/17/16      No current facility-administered medications for this visit.     Allergies  Allergen Reactions  . Codeine Itching and Nausea And Vomiting  . Ativan [Lorazepam] Other (See Comments)    "Makes me confused and crazy"  . Ambien [Zolpidem] Other (See Comments)    "Has the opposite effect; makes me more alert/hyper"      Review of Systems negative except from HPI and PMH  Physical Exam BP 122/72   Pulse 80   Ht 5\' 5"  (1.651 m)   Wt 133 lb 6 oz (60.5 kg)   LMP  (LMP Unknown)   SpO2 94%   BMI 22.19 kg/m  Well developed and well nourished in no acute distress HENT normal E scleral and icterus clear Neck Supple JVP flat; carotids brisk and full Clear to ausculation Regular rate and rhythm, no murmurs gallops or rub Soft with active bowel sounds No clubbing cyanosis   Edema s/p RBKA  Alert   grossly normal motor and sensory function Skin Warm and Dry  ECG demonstrates ventricular pacing at 80 Intervals-/14/45 with  Assessment and  Plan  Atrial fibrillation-permanent  Pacemaker-St. Jude  Cardiomyopathy-presumed this pacemaker mediated  CHF chronic systolic vlass 2  Anemia  On Anticoagulation;  No bleeding issues but Hgb is low and will need to check this  Relatively stable and now euvolemic  She is not very active and given current paucity of symptoms, I am not in favor of CRT upgrade, notwithstanding age  Continue current meds         Current medicines are reviewed at length with the patient today .  The patient does not  have concerns regarding medicines.

## 2016-07-11 NOTE — Patient Instructions (Addendum)
Medication Instructions:    Your physician recommends that you continue on your current medications as directed. Please refer to the Current Medication list given to you today.  --- If you need a refill on your cardiac medications before your next appointment, please call your pharmacy. ---  Labwork:  None ordered  Testing/Procedures:  None ordered  Follow-Up: Remote monitoring is used to monitor your Pacemaker of ICD from home. This monitoring reduces the number of office visits required to check your device to one time per year. It allows [place] to keep an eye on the functioning of your device to ensure it is working properly. You are scheduled for a device check from home on 10/10/2016. You may send your transmission at any time that day. If you have a wireless device, the transmission will be sent automatically. After your physician reviews your transmission, you will receive a postcard with your next transmission date.   Your physician wants you to follow-up in: 1 year with Sarah Marshall, NP.  You will receive a reminder letter in the mail two months in advance. If you don't receive a letter, please call our office to schedule the follow-up appointment.   Thank you for choosing [facility]!!

## 2016-07-18 ENCOUNTER — Telehealth: Payer: Self-pay | Admitting: *Deleted

## 2016-07-18 DIAGNOSIS — R710 Precipitous drop in hematocrit: Secondary | ICD-10-CM

## 2016-07-18 NOTE — Telephone Encounter (Signed)
Follow Up:; ° ° °Returning your call. °

## 2016-07-18 NOTE — Telephone Encounter (Signed)
Faxing order to [facility] for CBC w/ diff

## 2016-07-18 NOTE — Telephone Encounter (Signed)
Left message for dtr to call me to arrange f/u CBC w/ diff, per Dr. Caryl Comes. (CBC in March - Hgb low.  Pt is on Coumadin also)

## 2016-07-24 ENCOUNTER — Emergency Department (HOSPITAL_COMMUNITY): Payer: Medicare HMO

## 2016-07-24 ENCOUNTER — Encounter (HOSPITAL_COMMUNITY): Payer: Self-pay | Admitting: Emergency Medicine

## 2016-07-24 ENCOUNTER — Inpatient Hospital Stay (HOSPITAL_COMMUNITY)
Admission: EM | Admit: 2016-07-24 | Discharge: 2016-07-28 | DRG: 389 | Disposition: A | Discharge status: Skilled Nursing Facility | Payer: Medicare HMO | Attending: Family Medicine | Admitting: Family Medicine

## 2016-07-24 DIAGNOSIS — R410 Disorientation, unspecified: Secondary | ICD-10-CM | POA: Diagnosis not present

## 2016-07-24 DIAGNOSIS — G4730 Sleep apnea, unspecified: Secondary | ICD-10-CM | POA: Diagnosis present

## 2016-07-24 DIAGNOSIS — E1151 Type 2 diabetes mellitus with diabetic peripheral angiopathy without gangrene: Secondary | ICD-10-CM | POA: Diagnosis present

## 2016-07-24 DIAGNOSIS — Z7901 Long term (current) use of anticoagulants: Secondary | ICD-10-CM | POA: Diagnosis not present

## 2016-07-24 DIAGNOSIS — Z7982 Long term (current) use of aspirin: Secondary | ICD-10-CM

## 2016-07-24 DIAGNOSIS — E1122 Type 2 diabetes mellitus with diabetic chronic kidney disease: Secondary | ICD-10-CM | POA: Diagnosis present

## 2016-07-24 DIAGNOSIS — K219 Gastro-esophageal reflux disease without esophagitis: Secondary | ICD-10-CM | POA: Diagnosis present

## 2016-07-24 DIAGNOSIS — I482 Chronic atrial fibrillation: Secondary | ICD-10-CM

## 2016-07-24 DIAGNOSIS — E039 Hypothyroidism, unspecified: Secondary | ICD-10-CM | POA: Diagnosis present

## 2016-07-24 DIAGNOSIS — I5032 Chronic diastolic (congestive) heart failure: Secondary | ICD-10-CM | POA: Diagnosis present

## 2016-07-24 DIAGNOSIS — Z950 Presence of cardiac pacemaker: Secondary | ICD-10-CM | POA: Diagnosis not present

## 2016-07-24 DIAGNOSIS — I130 Hypertensive heart and chronic kidney disease with heart failure and stage 1 through stage 4 chronic kidney disease, or unspecified chronic kidney disease: Secondary | ICD-10-CM | POA: Diagnosis present

## 2016-07-24 DIAGNOSIS — Z833 Family history of diabetes mellitus: Secondary | ICD-10-CM | POA: Diagnosis not present

## 2016-07-24 DIAGNOSIS — I739 Peripheral vascular disease, unspecified: Secondary | ICD-10-CM | POA: Diagnosis present

## 2016-07-24 DIAGNOSIS — I272 Pulmonary hypertension, unspecified: Secondary | ICD-10-CM | POA: Diagnosis present

## 2016-07-24 DIAGNOSIS — J449 Chronic obstructive pulmonary disease, unspecified: Secondary | ICD-10-CM | POA: Diagnosis present

## 2016-07-24 DIAGNOSIS — R1032 Left lower quadrant pain: Secondary | ICD-10-CM | POA: Diagnosis not present

## 2016-07-24 DIAGNOSIS — K567 Ileus, unspecified: Principal | ICD-10-CM | POA: Diagnosis present

## 2016-07-24 DIAGNOSIS — I4821 Permanent atrial fibrillation: Secondary | ICD-10-CM | POA: Diagnosis present

## 2016-07-24 DIAGNOSIS — I481 Persistent atrial fibrillation: Secondary | ICD-10-CM | POA: Diagnosis present

## 2016-07-24 DIAGNOSIS — N183 Chronic kidney disease, stage 3 unspecified: Secondary | ICD-10-CM | POA: Diagnosis present

## 2016-07-24 DIAGNOSIS — K56609 Unspecified intestinal obstruction, unspecified as to partial versus complete obstruction: Secondary | ICD-10-CM | POA: Diagnosis not present

## 2016-07-24 DIAGNOSIS — E1142 Type 2 diabetes mellitus with diabetic polyneuropathy: Secondary | ICD-10-CM | POA: Diagnosis present

## 2016-07-24 DIAGNOSIS — Z823 Family history of stroke: Secondary | ICD-10-CM | POA: Diagnosis not present

## 2016-07-24 DIAGNOSIS — K5900 Constipation, unspecified: Secondary | ICD-10-CM | POA: Diagnosis present

## 2016-07-24 DIAGNOSIS — R509 Fever, unspecified: Secondary | ICD-10-CM

## 2016-07-24 DIAGNOSIS — I4811 Longstanding persistent atrial fibrillation: Secondary | ICD-10-CM | POA: Diagnosis present

## 2016-07-24 DIAGNOSIS — M549 Dorsalgia, unspecified: Secondary | ICD-10-CM

## 2016-07-24 DIAGNOSIS — Z7984 Long term (current) use of oral hypoglycemic drugs: Secondary | ICD-10-CM | POA: Diagnosis not present

## 2016-07-24 DIAGNOSIS — I10 Essential (primary) hypertension: Secondary | ICD-10-CM | POA: Diagnosis not present

## 2016-07-24 DIAGNOSIS — R109 Unspecified abdominal pain: Secondary | ICD-10-CM

## 2016-07-24 DIAGNOSIS — M5441 Lumbago with sciatica, right side: Secondary | ICD-10-CM | POA: Diagnosis not present

## 2016-07-24 DIAGNOSIS — G8929 Other chronic pain: Secondary | ICD-10-CM | POA: Diagnosis not present

## 2016-07-24 DIAGNOSIS — J439 Emphysema, unspecified: Secondary | ICD-10-CM | POA: Diagnosis present

## 2016-07-24 DIAGNOSIS — Z89511 Acquired absence of right leg below knee: Secondary | ICD-10-CM

## 2016-07-24 DIAGNOSIS — M5442 Lumbago with sciatica, left side: Secondary | ICD-10-CM | POA: Diagnosis not present

## 2016-07-24 LAB — COMPREHENSIVE METABOLIC PANEL
ALBUMIN: 3.4 g/dL — AB (ref 3.5–5.0)
ALT: 18 U/L (ref 14–54)
ANION GAP: 11 (ref 5–15)
AST: 28 U/L (ref 15–41)
Alkaline Phosphatase: 80 U/L (ref 38–126)
BUN: 20 mg/dL (ref 6–20)
CHLORIDE: 99 mmol/L — AB (ref 101–111)
CO2: 23 mmol/L (ref 22–32)
Calcium: 8.7 mg/dL — ABNORMAL LOW (ref 8.9–10.3)
Creatinine, Ser: 0.9 mg/dL (ref 0.44–1.00)
GFR calc Af Amer: >60 mL/min (ref >60)
GFR calc non Af Amer: >60 mL/min (ref >60)
GLUCOSE: 159 mg/dL — AB (ref 65–99)
Potassium: 4.3 mmol/L (ref 3.5–5.1)
SODIUM: 133 mmol/L — AB (ref 135–145)
Total Bilirubin: 0.6 mg/dL (ref 0.3–1.2)
Total Protein: 6.6 g/dL (ref 6.5–8.1)

## 2016-07-24 LAB — URINALYSIS, ROUTINE W REFLEX MICROSCOPIC
BACTERIA UA: NONE SEEN
BILIRUBIN URINE: NEGATIVE
Glucose, UA: NEGATIVE mg/dL
Hgb urine dipstick: NEGATIVE
KETONES UR: NEGATIVE mg/dL
Nitrite: NEGATIVE
PH: 5 (ref 5.0–8.0)
PROTEIN: NEGATIVE mg/dL
Specific Gravity, Urine: 1.013 (ref 1.005–1.030)

## 2016-07-24 LAB — CBC
HEMATOCRIT: 31.8 % — AB (ref 36.0–46.0)
HEMOGLOBIN: 10.3 g/dL — AB (ref 12.0–15.0)
MCH: 27.2 pg (ref 26.0–34.0)
MCHC: 32.4 g/dL (ref 30.0–36.0)
MCV: 83.9 fL (ref 78.0–100.0)
Platelets: 169 10*3/uL (ref 150–400)
RBC: 3.79 MIL/uL — AB (ref 3.87–5.11)
RDW: 18.4 % — ABNORMAL HIGH (ref 11.5–15.5)
WBC: 7.3 10*3/uL (ref 4.0–10.5)

## 2016-07-24 LAB — LIPASE, BLOOD: LIPASE: 51 U/L (ref 11–51)

## 2016-07-24 MED ORDER — DULOXETINE HCL 60 MG PO CPEP
60.0000 mg | ORAL_CAPSULE | Freq: Every day | ORAL | Status: DC
  Administered 2016-07-25 – 2016-07-28 (×4): 60 mg via ORAL
  Filled 2016-07-24 (×4): qty 1

## 2016-07-24 MED ORDER — INSULIN ASPART 100 UNIT/ML ~~LOC~~ SOLN
0.0000 [IU] | SUBCUTANEOUS | Status: DC
  Administered 2016-07-25 – 2016-07-26 (×2): 2 [IU] via SUBCUTANEOUS

## 2016-07-24 MED ORDER — SODIUM CHLORIDE 0.9 % IV BOLUS (SEPSIS)
250.0000 mL | Freq: Once | INTRAVENOUS | Status: AC
  Administered 2016-07-24: 250 mL via INTRAVENOUS

## 2016-07-24 MED ORDER — HYDROMORPHONE HCL 1 MG/ML IJ SOLN
1.0000 mg | Freq: Once | INTRAMUSCULAR | Status: AC
  Administered 2016-07-24: 1 mg via INTRAVENOUS
  Filled 2016-07-24: qty 1

## 2016-07-24 MED ORDER — LEVOTHYROXINE SODIUM 25 MCG PO TABS
25.0000 ug | ORAL_TABLET | Freq: Every day | ORAL | Status: DC
  Administered 2016-07-25 – 2016-07-28 (×4): 25 ug via ORAL
  Filled 2016-07-24 (×4): qty 1

## 2016-07-24 MED ORDER — SPIRONOLACTONE 25 MG PO TABS
12.5000 mg | ORAL_TABLET | Freq: Every day | ORAL | Status: DC
  Administered 2016-07-25 – 2016-07-28 (×4): 12.5 mg via ORAL
  Filled 2016-07-24 (×5): qty 1

## 2016-07-24 MED ORDER — ATORVASTATIN CALCIUM 40 MG PO TABS
40.0000 mg | ORAL_TABLET | Freq: Every day | ORAL | Status: DC
  Administered 2016-07-25 – 2016-07-28 (×4): 40 mg via ORAL
  Filled 2016-07-24 (×4): qty 1

## 2016-07-24 MED ORDER — ASPIRIN EC 81 MG PO TBEC
81.0000 mg | DELAYED_RELEASE_TABLET | Freq: Every day | ORAL | Status: DC
  Administered 2016-07-25 – 2016-07-28 (×4): 81 mg via ORAL
  Filled 2016-07-24 (×4): qty 1

## 2016-07-24 MED ORDER — PANTOPRAZOLE SODIUM 40 MG PO TBEC
40.0000 mg | DELAYED_RELEASE_TABLET | Freq: Every day | ORAL | Status: DC
Start: 2016-07-25 — End: 2016-07-28
  Administered 2016-07-25 – 2016-07-28 (×4): 40 mg via ORAL
  Filled 2016-07-24 (×4): qty 1

## 2016-07-24 MED ORDER — FENTANYL CITRATE (PF) 100 MCG/2ML IJ SOLN
25.0000 ug | Freq: Once | INTRAMUSCULAR | Status: AC
  Administered 2016-07-24: 25 ug via INTRAVENOUS
  Filled 2016-07-24: qty 2

## 2016-07-24 MED ORDER — IOPAMIDOL (ISOVUE-300) INJECTION 61%
INTRAVENOUS | Status: AC
  Administered 2016-07-24: 100 mL
  Filled 2016-07-24: qty 100

## 2016-07-24 MED ORDER — SODIUM CHLORIDE 0.9 % IV SOLN
INTRAVENOUS | Status: DC
  Administered 2016-07-24 – 2016-07-26 (×3): via INTRAVENOUS

## 2016-07-24 MED ORDER — ONDANSETRON HCL 4 MG/2ML IJ SOLN
4.0000 mg | Freq: Once | INTRAMUSCULAR | Status: AC
Start: 2016-07-24 — End: 2016-07-24
  Administered 2016-07-24: 4 mg via INTRAVENOUS
  Filled 2016-07-24: qty 2

## 2016-07-24 MED ORDER — LISINOPRIL 20 MG PO TABS
20.0000 mg | ORAL_TABLET | Freq: Every day | ORAL | Status: DC
  Administered 2016-07-25 – 2016-07-28 (×4): 20 mg via ORAL
  Filled 2016-07-24 (×4): qty 1

## 2016-07-24 MED ORDER — MOMETASONE FURO-FORMOTEROL FUM 200-5 MCG/ACT IN AERO
2.0000 | INHALATION_SPRAY | Freq: Two times a day (BID) | RESPIRATORY_TRACT | Status: DC
  Administered 2016-07-25 – 2016-07-28 (×7): 2 via RESPIRATORY_TRACT
  Filled 2016-07-24: qty 8.8

## 2016-07-24 MED ORDER — CARVEDILOL 12.5 MG PO TABS
12.5000 mg | ORAL_TABLET | Freq: Two times a day (BID) | ORAL | Status: DC
  Administered 2016-07-25 – 2016-07-28 (×7): 12.5 mg via ORAL
  Filled 2016-07-24 (×7): qty 1

## 2016-07-24 MED ORDER — GABAPENTIN 300 MG PO CAPS
300.0000 mg | ORAL_CAPSULE | Freq: Every day | ORAL | Status: DC
  Administered 2016-07-25 – 2016-07-27 (×4): 300 mg via ORAL
  Filled 2016-07-24 (×4): qty 1

## 2016-07-24 NOTE — ED Triage Notes (Addendum)
Per EMS:  Pt from [facility] with c/o of 8/10 abdominal pain.  PTA vitals: 152/86, R 88, RR 18, 98% RA, 132 CBG

## 2016-07-24 NOTE — ED Provider Notes (Signed)
[facility] DEPT Provider Note   CSN: [id number] Arrival date & time: 07/24/16  1625     History   Chief Complaint Chief Complaint  Patient presents with  . Abdominal Pain    HPI Sarah Moss is a 76 y.o. female.  Patient from [place] home. Patient with complaint of left lower quadrant abdominal pain started about 11:00 this morning. Pain is sharp and crampy in nature. Does move to the left flank area. Associated with nausea but no vomiting. Patient alert. Patient followed by cardiology has a history congestive heart failure. COPD. History of right BKA. Hypertension and diabetes. Clinically there may be some mild dementia. Patient is on the blood thinner Coumadin.  In addition patient has a known history of peripheral vascular disease. Has a stent in the left groin area. The patient states her foot is stable and fine. Patient also apparently has a renal stent. Patient has a pacemaker. And has a history supposedly of atrial fibrillation. Chronic monitoring here shows a sinus rhythm.      Past Medical History:  Diagnosis Date  . Asthma   . CHF (congestive heart failure) ([facility])   . COPD (chronic obstructive pulmonary disease) ([place])   . Diabetes mellitus without complication ([place])   . GERD (gastroesophageal reflux disease)   . Hx of right BKA ([place])   . Hypertension     Patient Active Problem List   Diagnosis Date Noted  . Ileus ([place]) 07/24/2016  . Permanent atrial fibrillation ([place]) 05/10/2016  . Status post below knee amputation of right lower extremity ([place]) 05/10/2016  . Longstanding persistent atrial fibrillation ([place]) 04/02/2016  . CHB (complete heart block) ([facility]) s/p AV node ablation 04/02/2016  . Chronic anticoagulation 03/22/2016  . Microcytic anemia 03/22/2016  . Diabetes mellitus with hemoglobin A1c goal of 7.0%-8.0% ([place])   . Cardiomyopathy ([place]) 03/21/2016  . PAD (peripheral artery disease) ([place])   . Chronic atrial fibrillation ([place])  03/18/2016  . Amputee, below knee ([place]) 03/18/2016  . Congestive heart failure ([place]) 03/18/2016  . Pulmonary emphysema ([place]) 03/18/2016  . GERD (gastroesophageal reflux disease) 03/18/2016  . Acute systolic heart failure ([place]) 03/18/2016  . Bilateral pleural effusion 02/24/2015  . S/P AV nodal ablation 02/21/2015  . Cardiac pacemaker in situ 02/20/2015  . Urticaria 02/20/2015  . Type 2 diabetes mellitus with complication ([place]) 67/89/3810  . Tachy-brady syndrome ([place]) 12/19/2014  . Normal left ventricular systolic function and wall motion 10/12/2014  . Benign essential hypertension 09/14/2014  . Dyspnea 09/14/2014  . Long-term use of aspirin therapy 09/14/2014  . Allergic rhinitis 08/03/2014  . DOE (dyspnea on exertion) 08/03/2014  . Pulmonary HTN ([place]) 08/03/2014  . Sleep apnea 08/03/2014  . Sleep apnea with use of continuous positive airway pressure (CPAP) 08/03/2014  . Chronic diastolic CHF (congestive heart failure) ([place]) 05/30/2014  . HTN (hypertension) 11/04/2012  . Hypothyroidism 11/04/2012  . Type 2 diabetes mellitus with diabetic polyneuropathy ([place]) 11/04/2012  . Red blood cell antibody positive 10/28/2011  . Arthropathy 08/17/2009  . Mixed incontinence 08/17/2009  . Benign renovascular hypertension 08/17/2009  . Anxiety state 06/05/2009  . Abnormal finding on thyroid function test 06/01/2009  . Vitamin D deficiency 06/01/2009  . Type II or unspecified type diabetes mellitus without mention of complication, not stated as uncontrolled 05/26/2009  . Asthma 05/24/2009  . Chronic kidney disease, stage III (moderate) 05/24/2009  . Depressive disorder, not elsewhere classified 05/24/2009  . Insomnia 05/24/2009  . Osteoarthritis 05/24/2009  . Osteoporosis 05/24/2009  .  Peripheral vascular disease ([place]) 05/24/2009    Past Surgical History:  Procedure Laterality Date  . LOWER EXTREMITY ANGIOGRAPHY N/A 03/19/2016   Procedure: Lower Extremity Angiography;  Surgeon: Serafina Mitchell, MD;  Location: [facility];  Service: Cardiovascular;  Laterality: N/A;  . PERIPHERAL VASCULAR INTERVENTION Bilateral 03/19/2016   Procedure: Peripheral Vascular Intervention;  Surgeon: Serafina Mitchell, MD;  Location: [facility];  Service: Cardiovascular;  Laterality: Bilateral;  iliacs    OB History    No data available       Home Medications    Prior to Admission medications   Medication Sig Start Date End Date Taking? Authorizing Provider  ADVAIR DISKUS 250-50 MCG/DOSE AEPB Inhale 1 puff into the lungs 2 (two) times daily. 03/08/16  Yes [provider]  aspirin EC 81 MG EC tablet Take 1 tablet (81 mg total) by mouth daily. 03/24/16  Yes Eugenie Filler, MD  atorvastatin (LIPITOR) 40 MG tablet Take 40 mg by mouth daily. 05/30/14  Yes [provider]  carvedilol (COREG) 12.5 MG tablet Take 1 tablet (12.5 mg total) by mouth 2 (two) times daily with a meal. 05/27/16  Yes Isaiah Serge, NP  DULoxetine (CYMBALTA) 60 MG capsule Take 60 mg by mouth daily.   Yes [provider]  furosemide (LASIX) 40 MG tablet Take 40 mg by mouth daily.   Yes [provider]  furosemide (LASIX) 80 MG tablet Take 1 tablet (80 mg total) by mouth daily. Patient taking differently: Take 80 mg by mouth daily as needed for fluid (for 3 lb weight gain).  06/04/16 09/02/16 Yes Isaiah Serge, NP  gabapentin (NEURONTIN) 300 MG capsule Take 300 mg by mouth at bedtime. 05/30/14  Yes [provider]  iron polysaccharides (NIFEREX) 150 MG capsule Take 1 capsule (150 mg total) by mouth daily. 03/24/16  Yes Eugenie Filler, MD  levothyroxine (SYNTHROID, LEVOTHROID) 25 MCG tablet Take 25 mcg by mouth daily. 05/30/14  Yes [provider]  lisinopril (PRINIVIL,ZESTRIL) 20 MG tablet Take 20 mg by mouth daily.   Yes [provider]  metFORMIN (GLUCOPHAGE) 1000 MG tablet Take 1,000 mg by mouth 2 (two) times daily. 05/30/14  Yes [provider]  montelukast (SINGULAIR) 10 MG tablet Take 10 mg by mouth at bedtime.   Yes [provider]  nitroGLYCERIN (NITROSTAT) 0.4 MG SL tablet Place 0.4 mg under the tongue every 5 (five) minutes as needed for chest pain.   Yes [provider]  omeprazole (PRILOSEC) 40 MG capsule Take 40 mg by mouth daily.   Yes [provider]  oxyCODONE-acetaminophen (PERCOCET/ROXICET) 5-325 MG tablet Take 1 tablet by mouth every 4 (four) hours as needed for severe pain.   Yes [provider]  polyethylene glycol (MIRALAX / GLYCOLAX) packet Take 17 g by mouth 2 (two) times daily. 03/23/16  Yes Eugenie Filler, MD  senna-docusate (SENOKOT-S) 8.6-50 MG tablet Take 1 tablet by mouth 2 (two) times daily. 03/23/16  Yes Eugenie Filler, MD  spironolactone (ALDACTONE) 25 MG tablet Take 12.5 mg by mouth daily.    Yes [provider]  UNABLE TO FIND Take 120 mLs by mouth 2 (two) times daily. Med Name: MedPass   Yes [provider]  VENTOLIN HFA 108 (90 Base) MCG/ACT inhaler Inhale 1 puff into the lungs every 6 (six) hours as needed for shortness of breath.  03/08/16  Yes [provider]  Vitamin D, Ergocalciferol, (DRISDOL) 50000  units CAPS capsule Take 50,000 Units by mouth 2 (two) times a week.   Yes [provider]  warfarin (COUMADIN) 6 MG tablet Take 6 mg by mouth daily.   Yes [provider]  ALPRAZolam (XANAX) 0.25 MG tablet Take 1 tablet (0.25 mg total) by mouth every 12 (twelve) hours as needed for anxiety. Patient not taking: Reported on 07/24/2016 03/23/16   Eugenie Filler, MD    Family History Family History  Problem Relation Age of Onset  . Stroke Mother   . Diabetes Mother   . Cancer Father     Social History Social History  Substance Use Topics  . Smoking status: Former Research scientist (life sciences)  . Smokeless tobacco: Never Used  . Alcohol use No     Allergies   Codeine; Ativan [lorazepam]; and Ambien [zolpidem]   Review of  Systems Review of Systems  Constitutional: Negative for fever.  HENT: Negative for congestion.   Respiratory: Negative for shortness of breath.   Cardiovascular: Negative for chest pain.  Gastrointestinal: Positive for abdominal pain and nausea.  Genitourinary: Positive for flank pain. Negative for dysuria.  Musculoskeletal: Negative for back pain.  Skin: Negative for rash.  Neurological: Negative for syncope.  Hematological: Bruises/bleeds easily.  Psychiatric/Behavioral: Positive for confusion.     Physical Exam Updated Vital Signs BP 120/63   Pulse 70   Temp 98 F (36.7 C) (Oral)   Resp 12   LMP  (LMP Unknown)   SpO2 93%   Physical Exam  Constitutional: She appears well-developed and well-nourished. No distress.  HENT:  Head: Normocephalic and atraumatic.  Mouth/Throat: Oropharynx is clear and moist.  Eyes: Conjunctivae and EOM are normal. Pupils are equal, round, and reactive to light.  Neck: Neck supple.  Cardiovascular: Normal rate, regular rhythm and normal heart sounds.   Pulmonary/Chest: Effort normal and breath sounds normal. No respiratory distress.  Abdominal: Soft. Bowel sounds are normal. There is no tenderness.  Musculoskeletal: She exhibits no edema.  Right below the knee amputation. Left leg with Refill around 2 seconds. Warm no bluing. No pain in the foot.  Neurological: She is alert. No cranial nerve deficit or sensory deficit. She exhibits normal muscle tone. Coordination normal.  Skin: Skin is warm.  Nursing note and vitals reviewed.    ED Treatments / Results  Labs (all labs ordered are listed, but only abnormal results are displayed) Labs Reviewed  COMPREHENSIVE METABOLIC PANEL - Abnormal; Notable for the following:       Result Value   Sodium 133 (*)    Chloride 99 (*)    Glucose, Bld 159 (*)    Calcium 8.7 (*)    Albumin 3.4 (*)    All other components within normal limits  CBC - Abnormal; Notable for the following:    RBC 3.79 (*)     Hemoglobin 10.3 (*)    HCT 31.8 (*)    RDW 18.4 (*)    All other components within normal limits  URINALYSIS, ROUTINE W REFLEX MICROSCOPIC - Abnormal; Notable for the following:    Leukocytes, UA SMALL (*)    Squamous Epithelial / LPF 0-5 (*)    All other components within normal limits  LIPASE, BLOOD  PROTIME-INR  COMPREHENSIVE METABOLIC PANEL  CBC    EKG  EKG Interpretation  Date/Time:  Wednesday July 24 2016 23:36:17 EDT Ventricular Rate:  75 PR Interval:    QRS Duration: 140 QT Interval:  468 QTC Calculation: 523 R Axis:   -13 Text  Interpretation:  Sinus rhythm Prolonged PR interval Left bundle branch block Baseline wander in lead(s) V2 V3 V6 A-V paced Confirmed by Fredia Sorrow [phone]) on 07/24/2016 11:42:43 PM       Radiology Ct Abdomen Pelvis W Contrast  Result Date: 07/24/2016 CLINICAL DATA:  Left lower quadrant pain with nausea EXAM: CT ABDOMEN AND PELVIS WITH CONTRAST TECHNIQUE: Multidetector CT imaging of the abdomen and pelvis was performed using the standard protocol following bolus administration of intravenous contrast. CONTRAST:  130mL ISOVUE-300 IOPAMIDOL (ISOVUE-300) INJECTION 61% COMPARISON:  09/02/2010, 07/16/2010 FINDINGS: Lower chest: Small bilateral pleural effusions. Cardiomegaly. Hazy edema or atelectasis within the posterior lung bases. Hepatobiliary: Nonvisualized gallbladder presumably due to surgical absence. Mild intra hepatic biliary dilatation. Enlarged extrahepatic common bile duct, measuring up to 15 mm. Pancreas: Unremarkable. No pancreatic ductal dilatation or surrounding inflammatory changes. Spleen: Normal in size without focal abnormality. Adrenals/Urinary Tract: Adrenal glands are within normal limits. Atrophic right kidney. Cortical scarring lower pole left kidney. No hydronephrosis. Bladder normal Stomach/Bowel: Stomach is nonenlarged. Borderline enlarged fluid-filled loops of small bowel measuring up to 2.8 cm in the lower abdomen and upper  pelvis with gradual transition to decompressed distal small bowel loops. No colon wall thickening. Appendix not well identified but no right lower quadrant inflammation. Vascular/Lymphatic: Heavily calcified aorta without aneurysmal dilatation. Bilateral iliac stents. No significantly enlarged abdominal or pelvic lymph nodes. Reproductive: Prostate is unremarkable. Other: No free air or free fluid. Musculoskeletal: Degenerative changes of the spine. No acute or suspicious bone lesion. IMPRESSION: 1. There are borderline enlarged fluid-filled loops of small bowel in the lower abdomen and upper pelvis with decompressed distal small bowel loops. Findings could be secondary to ileus although developing bowel obstruction is also a consideration. 2. There are small bilateral pleural effusions. Cardiomegaly with hazy atelectasis or edema at the bases 3. Mild to moderate intra and extrahepatic biliary dilatation, nonvisualized gallbladder which is presumed to be surgically absent. Findings could be related to post cholecystectomy change however suggest correlation with laboratory values. This is progressed since previous CT from 2012. 4. Atrophic right kidney with cortical scarring in the lower pole of the left kidney. No hydronephrosis. Electronically Signed   By: Donavan Foil M.D.   On: 07/24/2016 22:15    Procedures Procedures (including critical care time)  Medications Ordered in ED Medications  0.9 %  sodium chloride infusion ( Intravenous New Bag/Given 07/24/16 1733)  insulin aspart (novoLOG) injection 0-15 Units (not administered)  mometasone-formoterol (DULERA) 200-5 MCG/ACT inhaler 2 puff (not administered)  aspirin EC tablet 81 mg (not administered)  atorvastatin (LIPITOR) tablet 40 mg (not administered)  carvedilol (COREG) tablet 12.5 mg (not administered)  DULoxetine (CYMBALTA) DR capsule 60 mg (not administered)  gabapentin (NEURONTIN) capsule 300 mg (not administered)  lisinopril  (PRINIVIL,ZESTRIL) tablet 20 mg (not administered)  levothyroxine (SYNTHROID, LEVOTHROID) tablet 25 mcg (not administered)  pantoprazole (PROTONIX) EC tablet 40 mg (not administered)  spironolactone (ALDACTONE) tablet 12.5 mg (not administered)  ondansetron (ZOFRAN) injection 4 mg (4 mg Intravenous Given 07/24/16 1734)  sodium chloride 0.9 % bolus 250 mL (0 mLs Intravenous Stopped 07/24/16 1757)  fentaNYL (SUBLIMAZE) injection 25 mcg (25 mcg Intravenous Given 07/24/16 1734)  fentaNYL (SUBLIMAZE) injection 25 mcg (25 mcg Intravenous Given 07/24/16 2032)  iopamidol (ISOVUE-300) 61 % injection (100 mLs  Contrast Given 07/24/16 2143)  HYDROmorphone (DILAUDID) injection 1 mg (1 mg Intravenous Given 07/24/16 2244)     Initial Impression / Assessment and Plan / ED Course  I have reviewed  the triage vital signs and the nursing notes.  Pertinent labs & imaging results that were available during my care of the patient were reviewed by me and considered in my medical decision making (see chart for details).    Discussed with the hospitalist admitting team. Patient CT scan raises concerns for early small bowel obstruction. Doubt that there is an ileus and sisters good bowel sounds. There is some increased stool in the colon. But do not think that this is distinctly constipation. Patient does have risk factors for small bowel obstruction. She's had a hysterectomy as well as her gallbladder removed. Do not think there is any problem with her vascular stents. Do not feel that this is representing ischemia to her left leg. Patient's pain controlled here with the 1 mg of Dilaudid. Hospitalist team will admit. Put her on bowel rest.  Patient's cardiac monitoring here shows no evidence of atrial fibrillation. Patient does have a pacemaker. EKG here shows AV paced rhythm. Patient is on Coumadin and has INR pending.   Final Clinical Impressions(s) / ED Diagnoses   Final diagnoses:  Left lower quadrant pain  SBO  (small bowel obstruction) ([place])    New Prescriptions New Prescriptions   No medications on file     Fredia Sorrow, MD 07/24/16 2343

## 2016-07-24 NOTE — H&P (Signed)
History and Physical    Sarah Moss HDQ:[id number] DOB: 01/31/1940 DOA: 07/24/2016  PCP: Wenda Low, MD  Patient coming from: Facility  Chief Complaint: LLQ Pain  HPI: Sarah Moss is a 76 y.o. female with medical history significant of COPD, DM2, GERD, hx R BKA, HTN, chronic systolic diastolic HTN. Of note, patient is poor historian. Patient's sister in room who reports patient has been refusing bowel regimen regularly. Patient noted to have a hx of significant constipation 2-3 weeks prior to admission with resultant abd pain, resolved with large BM. Unknown if patient has been constipated at facility.  ED Course: In the ED, pt underwent abd CT with findings worrisome for developing ileus. WBC within normal limit. Given radiographic concerns, hospitalist consulted for consideration for admission.  Review of Systems:  Review of Systems  Constitutional: Negative for chills, fever, malaise/fatigue and weight loss.  HENT: Negative for congestion, ear discharge and sinus pain.   Eyes: Negative for double vision, photophobia and discharge.  Respiratory: Negative for hemoptysis, sputum production and shortness of breath.   Cardiovascular: Negative for palpitations, orthopnea and claudication.  Gastrointestinal: Positive for abdominal pain, constipation and nausea.  Genitourinary: Negative for frequency, hematuria and urgency.  Musculoskeletal: Negative for back pain, joint pain and neck pain.  Neurological: Negative for tingling, tremors, seizures and loss of consciousness.  Psychiatric/Behavioral: Negative for hallucinations and memory loss. The patient is not nervous/anxious.     Past Medical History:  Diagnosis Date  . Asthma   . CHF (congestive heart failure) ([place])   . COPD (chronic obstructive pulmonary disease) ([place])   . Diabetes mellitus without complication ([place])   . GERD (gastroesophageal reflux disease)   . Hx of right BKA ([place])   . Hypertension     Past  Surgical History:  Procedure Laterality Date  . LOWER EXTREMITY ANGIOGRAPHY N/A 03/19/2016   Procedure: Lower Extremity Angiography;  Surgeon: Serafina Mitchell, MD;  Location: [facility];  Service: Cardiovascular;  Laterality: N/A;  . PERIPHERAL VASCULAR INTERVENTION Bilateral 03/19/2016   Procedure: Peripheral Vascular Intervention;  Surgeon: Serafina Mitchell, MD;  Location: [facility];  Service: Cardiovascular;  Laterality: Bilateral;  iliacs     reports that she has quit smoking. She has never used smokeless tobacco. She reports that she does not drink alcohol or use drugs.  Allergies  Allergen Reactions  . Codeine Itching and Nausea And Vomiting  . Ativan [Lorazepam] Other (See Comments)    "Makes me confused and crazy"  . Ambien [Zolpidem] Other (See Comments)    "Has the opposite effect; makes me more alert/hyper"    Family History  Problem Relation Age of Onset  . Stroke Mother   . Diabetes Mother   . Cancer Father     Prior to Admission medications   Medication Sig Start Date End Date Taking? Authorizing Provider  ADVAIR DISKUS 250-50 MCG/DOSE AEPB Inhale 1 puff into the lungs 2 (two) times daily. 03/08/16  Yes [provider]  aspirin EC 81 MG EC tablet Take 1 tablet (81 mg total) by mouth daily. 03/24/16  Yes Eugenie Filler, MD  atorvastatin (LIPITOR) 40 MG tablet Take 40 mg by mouth daily. 05/30/14  Yes [provider]  carvedilol (COREG) 12.5 MG tablet Take 1 tablet (12.5 mg total) by mouth 2 (two) times daily with a meal. 05/27/16  Yes Isaiah Serge, NP  DULoxetine (CYMBALTA) 60 MG capsule Take 60 mg by mouth daily.   Yes [provider]  furosemide (LASIX) 40 MG tablet Take 40 mg by mouth daily.   Yes [provider]  furosemide (LASIX) 80 MG tablet Take 1 tablet (80 mg total) by mouth daily. Patient taking differently: Take 80 mg by mouth daily as needed for fluid (for 3 lb weight gain).  06/04/16 09/02/16 Yes Isaiah Serge, NP  gabapentin (NEURONTIN) 300 MG capsule Take 300 mg by mouth at bedtime. 05/30/14  Yes [provider]  iron polysaccharides (NIFEREX) 150 MG capsule Take 1 capsule (150 mg total) by mouth daily. 03/24/16  Yes Eugenie Filler, MD  levothyroxine (SYNTHROID, LEVOTHROID) 25 MCG tablet Take 25 mcg by mouth daily. 05/30/14  Yes [provider]  lisinopril (PRINIVIL,ZESTRIL) 20 MG tablet Take 20 mg by mouth daily.   Yes [provider]  metFORMIN (GLUCOPHAGE) 1000 MG tablet Take 1,000 mg by mouth 2 (two) times daily. 05/30/14  Yes [provider]  montelukast (SINGULAIR) 10 MG tablet Take 10 mg by mouth at bedtime.   Yes [provider]  nitroGLYCERIN (NITROSTAT) 0.4 MG SL tablet Place 0.4 mg under the tongue every 5 (five) minutes as needed for chest pain.   Yes [provider]  omeprazole (PRILOSEC) 40 MG capsule Take 40 mg by mouth daily.   Yes [provider]  oxyCODONE-acetaminophen (PERCOCET/ROXICET) 5-325 MG tablet Take 1 tablet by mouth every 4 (four) hours as needed for severe pain.   Yes [provider]  polyethylene glycol (MIRALAX / GLYCOLAX) packet Take 17 g by mouth 2 (two) times daily. 03/23/16  Yes Eugenie Filler, MD  senna-docusate (SENOKOT-S) 8.6-50 MG tablet Take 1 tablet by mouth 2 (two) times daily. 03/23/16  Yes Eugenie Filler, MD  spironolactone (ALDACTONE) 25 MG tablet Take 12.5 mg by mouth daily.    Yes [provider]  UNABLE TO FIND Take 120 mLs by mouth 2 (two) times daily. Med Name: MedPass   Yes [provider]  VENTOLIN HFA 108 (90 Base) MCG/ACT inhaler Inhale 1 puff into the lungs every 6 (six) hours as needed for shortness of breath.  03/08/16  Yes [provider]  Vitamin D, Ergocalciferol, (DRISDOL) 50000 units CAPS capsule Take 50,000 Units by mouth 2 (two) times a week.   Yes [provider]  warfarin (COUMADIN) 6 MG tablet Take 6 mg by mouth daily.    Yes [provider]  ALPRAZolam (XANAX) 0.25 MG tablet Take 1 tablet (0.25 mg total) by mouth every 12 (twelve) hours as needed for anxiety. Patient not taking: Reported on 07/24/2016 03/23/16   Eugenie Filler, MD    Physical Exam: Vitals:   07/24/16 2115 07/24/16 2130 07/24/16 2200 07/24/16 2245  BP: 129/70 138/64  128/69  Pulse: 73 70    Resp: 10 12 12 13   Temp:      TempSrc:      SpO2: 96% 93%      Constitutional: NAD, calm, comfortable Vitals:   07/24/16 2115 07/24/16 2130 07/24/16 2200 07/24/16 2245  BP: 129/70 138/64  128/69  Pulse: 73 70    Resp: 10 12 12 13   Temp:      TempSrc:      SpO2: 96% 93%     Eyes: PERRL, lids and conjunctivae normal ENMT: Mucous membranes are moist. Posterior pharynx clear of any exudate or lesions.Normal dentition.  Neck: normal, supple, no masses, no thyromegaly Respiratory: clear to auscultation bilaterally, no wheezing, no crackles. Normal respiratory effort. No accessory muscle  use.  Cardiovascular: Regular rate and rhythm Abdomen: generalized tenderness, decreased BS, nondistended  Musculoskeletal: no clubbing / cyanosis. No joint deformity upper and lower extremities. Good ROM, no contractures. Normal muscle tone.  Skin: no rashes, lesions, ulcers. No induration Neurologic: CN 2-12 grossly intact. Sensation intact, DTR normal. Strength 5/5 in all 4.  Psychiatric: Normal judgment and insight. Alert and oriented x 3. Normal mood.    Labs on Admission: I have personally reviewed following labs and imaging studies  CBC:  Recent Labs Lab 07/24/16 1633  WBC 7.3  HGB 10.3*  HCT 31.8*  MCV 83.9  PLT 811   Basic Metabolic Panel:  Recent Labs Lab 07/24/16 1633  NA 133*  K 4.3  CL 99*  CO2 23  GLUCOSE 159*  BUN 20  CREATININE 0.90  CALCIUM 8.7*   GFR: Estimated Creatinine Clearance: 47.1 mL/min (by C-G formula based on SCr of 0.9 mg/dL). Liver Function Tests:  Recent Labs Lab 07/24/16 1633  AST 28  ALT  18  ALKPHOS 80  BILITOT 0.6  PROT 6.6  ALBUMIN 3.4*    Recent Labs Lab 07/24/16 1633  LIPASE 51   No results for input(s): AMMONIA in the last 168 hours. Coagulation Profile: No results for input(s): INR, PROTIME in the last 168 hours. Cardiac Enzymes: No results for input(s): CKTOTAL, CKMB, CKMBINDEX, TROPONINI in the last 168 hours. BNP (last 3 results) No results for input(s): PROBNP in the last 8760 hours. HbA1C: No results for input(s): HGBA1C in the last 72 hours. CBG: No results for input(s): GLUCAP in the last 168 hours. Lipid Profile: No results for input(s): CHOL, HDL, LDLCALC, TRIG, CHOLHDL, LDLDIRECT in the last 72 hours. Thyroid Function Tests: No results for input(s): TSH, T4TOTAL, FREET4, T3FREE, THYROIDAB in the last 72 hours. Anemia Panel: No results for input(s): VITAMINB12, FOLATE, FERRITIN, TIBC, IRON, RETICCTPCT in the last 72 hours. Urine analysis:    Component Value Date/Time   COLORURINE YELLOW 07/24/2016 1708   APPEARANCEUR CLEAR 07/24/2016 1708   LABSPEC 1.013 07/24/2016 1708   PHURINE 5.0 07/24/2016 1708   GLUCOSEU NEGATIVE 07/24/2016 1708   HGBUR NEGATIVE 07/24/2016 1708   BILIRUBINUR NEGATIVE 07/24/2016 1708   KETONESUR NEGATIVE 07/24/2016 1708   PROTEINUR NEGATIVE 07/24/2016 1708   NITRITE NEGATIVE 07/24/2016 1708   LEUKOCYTESUR SMALL (A) 07/24/2016 1708   Sepsis Labs: !!!!!!!!!!!!!!!!!!!!!!!!!!!!!!!!!!!!!!!!!!!! @LABRCNTIP (procalcitonin:4,lacticidven:4) )No results found for this or any previous visit (from the past 240 hour(s)).   Radiological Exams on Admission: Ct Abdomen Pelvis W Contrast  Result Date: 07/24/2016 CLINICAL DATA:  Left lower quadrant pain with nausea EXAM: CT ABDOMEN AND PELVIS WITH CONTRAST TECHNIQUE: Multidetector CT imaging of the abdomen and pelvis was performed using the standard protocol following bolus administration of intravenous contrast. CONTRAST:  118mL ISOVUE-300 IOPAMIDOL (ISOVUE-300) INJECTION 61%  COMPARISON:  09/02/2010, 07/16/2010 FINDINGS: Lower chest: Small bilateral pleural effusions. Cardiomegaly. Hazy edema or atelectasis within the posterior lung bases. Hepatobiliary: Nonvisualized gallbladder presumably due to surgical absence. Mild intra hepatic biliary dilatation. Enlarged extrahepatic common bile duct, measuring up to 15 mm. Pancreas: Unremarkable. No pancreatic ductal dilatation or surrounding inflammatory changes. Spleen: Normal in size without focal abnormality. Adrenals/Urinary Tract: Adrenal glands are within normal limits. Atrophic right kidney. Cortical scarring lower pole left kidney. No hydronephrosis. Bladder normal Stomach/Bowel: Stomach is nonenlarged. Borderline enlarged fluid-filled loops of small bowel measuring up to 2.8 cm in the lower abdomen and upper pelvis with gradual transition to decompressed distal small bowel loops. No colon wall thickening. Appendix not  well identified but no right lower quadrant inflammation. Vascular/Lymphatic: Heavily calcified aorta without aneurysmal dilatation. Bilateral iliac stents. No significantly enlarged abdominal or pelvic lymph nodes. Reproductive: Prostate is unremarkable. Other: No free air or free fluid. Musculoskeletal: Degenerative changes of the spine. No acute or suspicious bone lesion. IMPRESSION: 1. There are borderline enlarged fluid-filled loops of small bowel in the lower abdomen and upper pelvis with decompressed distal small bowel loops. Findings could be secondary to ileus although developing bowel obstruction is also a consideration. 2. There are small bilateral pleural effusions. Cardiomegaly with hazy atelectasis or edema at the bases 3. Mild to moderate intra and extrahepatic biliary dilatation, nonvisualized gallbladder which is presumed to be surgically absent. Findings could be related to post cholecystectomy change however suggest correlation with laboratory values. This is progressed since previous CT from 2012. 4.  Atrophic right kidney with cortical scarring in the lower pole of the left kidney. No hydronephrosis. Electronically Signed   By: Donavan Foil M.D.   On: 07/24/2016 22:15   CT reviewed. Large stool in the ascending colon into transverse colon per my own read  Assessment/Plan Active Problems:   Pulmonary emphysema (HCC)   PAD (peripheral artery disease) (HCC)   Longstanding persistent atrial fibrillation (HCC)   Cardiac pacemaker in situ   Chronic diastolic CHF (congestive heart failure) (HCC)   Chronic kidney disease, stage III (moderate)   HTN (hypertension)   Hypothyroidism   Long-term use of aspirin therapy   Pulmonary HTN (HCC)   Sleep apnea with use of continuous positive airway pressure (CPAP)   Type 2 diabetes mellitus with diabetic polyneuropathy (HCC)   Permanent atrial fibrillation ([place])   Status post below knee amputation of right lower extremity ([place])   1. Possible ileus 1. CT reviewed. Patient with findings concerning for developing ileus although stool seen in ascending colon per my read 2. Will keep NPO except for meds 3. Continue on basal IVF 4. Admit to med-surg 5. Will give trial of soap suds enema 2. Afib 1. Currently rate controlled 2. Continue on scheduled beta blocker 3. Chronic anticoagulation 1. INR ordered by EDP 2. Request coumadin dosing per pharmacy 4. Hx chronic diastolic chf 1. Stable at present 2. Cont gentle IVF as per above 3. Hold lasix while NPO 5. HTN 1. BP stable 2. Will cont home regimen 6. Hx pulm HTN 1. Appears to be stable 7. CKD3 1. Labs reviewed 2. Stable and at baseline 8. DM2 1. Will continue on SSI coverage 9. Hx tachy-brady, s/p pacemaker 1. Stable at present  DVT prophylaxis: Therapeutic anticoagulation  Code Status: Full Family Communication: Pt in room, family at bedside Disposition Plan: Uncertain at this time  Consults called:  Admission status: will likely require greater than 2 midnight stay to resolve  possible ileus   Rubylee Zamarripa, Orpah Melter MD [facility] Pager [phone]  If 7PM-7AM, please contact night-coverage [email or web address] Password [facility]  07/24/2016, 11:04 PM

## 2016-07-24 NOTE — ED Notes (Signed)
Sarah Moss SISTER [phone] Call if needed and for room update.

## 2016-07-24 NOTE — ED Notes (Addendum)
Pt from [place]. Pt reports abdominal pain in LLQ that started around 11 am today. Pt reports the pain is sharp and constant. Pt reports some nausea, no vomiting. Ritta Slot did a UA today that came back clean. Pt alert and oriented.

## 2016-07-24 NOTE — ED Notes (Signed)
Patient transported to CT 

## 2016-07-25 ENCOUNTER — Inpatient Hospital Stay (HOSPITAL_COMMUNITY): Payer: Medicare HMO

## 2016-07-25 DIAGNOSIS — I5032 Chronic diastolic (congestive) heart failure: Secondary | ICD-10-CM

## 2016-07-25 DIAGNOSIS — I10 Essential (primary) hypertension: Secondary | ICD-10-CM

## 2016-07-25 DIAGNOSIS — K56609 Unspecified intestinal obstruction, unspecified as to partial versus complete obstruction: Secondary | ICD-10-CM

## 2016-07-25 DIAGNOSIS — K567 Ileus, unspecified: Principal | ICD-10-CM

## 2016-07-25 DIAGNOSIS — E039 Hypothyroidism, unspecified: Secondary | ICD-10-CM

## 2016-07-25 DIAGNOSIS — G8929 Other chronic pain: Secondary | ICD-10-CM

## 2016-07-25 DIAGNOSIS — N183 Chronic kidney disease, stage 3 (moderate): Secondary | ICD-10-CM

## 2016-07-25 DIAGNOSIS — I481 Persistent atrial fibrillation: Secondary | ICD-10-CM

## 2016-07-25 DIAGNOSIS — M5441 Lumbago with sciatica, right side: Secondary | ICD-10-CM

## 2016-07-25 LAB — COMPREHENSIVE METABOLIC PANEL
ALBUMIN: 3.3 g/dL — AB (ref 3.5–5.0)
ALT: 19 U/L (ref 14–54)
ANION GAP: 11 (ref 5–15)
AST: 26 U/L (ref 15–41)
Alkaline Phosphatase: 79 U/L (ref 38–126)
BILIRUBIN TOTAL: 0.6 mg/dL (ref 0.3–1.2)
BUN: 20 mg/dL (ref 6–20)
CHLORIDE: 102 mmol/L (ref 101–111)
CO2: 22 mmol/L (ref 22–32)
Calcium: 8.6 mg/dL — ABNORMAL LOW (ref 8.9–10.3)
Creatinine, Ser: 0.94 mg/dL (ref 0.44–1.00)
GFR calc Af Amer: >60 mL/min (ref >60)
GFR, EST NON AFRICAN AMERICAN: 57 mL/min — AB (ref >60)
GLUCOSE: 97 mg/dL (ref 65–99)
POTASSIUM: 4.6 mmol/L (ref 3.5–5.1)
Sodium: 135 mmol/L (ref 135–145)
TOTAL PROTEIN: 6.4 g/dL — AB (ref 6.5–8.1)

## 2016-07-25 LAB — CBC
HCT: 29.4 % — ABNORMAL LOW (ref 36.0–46.0)
HEMATOCRIT: 30.7 % — AB (ref 36.0–46.0)
HEMOGLOBIN: 10 g/dL — AB (ref 12.0–15.0)
Hemoglobin: 9.6 g/dL — ABNORMAL LOW (ref 12.0–15.0)
MCH: 27.9 pg (ref 26.0–34.0)
MCH: 27.7 pg (ref 26.0–34.0)
MCHC: 32.6 g/dL (ref 30.0–36.0)
MCHC: 32.7 g/dL (ref 30.0–36.0)
MCV: 84.7 fL (ref 78.0–100.0)
MCV: 85.8 fL (ref 78.0–100.0)
PLATELETS: 168 10*3/uL (ref 150–400)
Platelets: 190 10*3/uL (ref 150–400)
RBC: 3.58 MIL/uL — ABNORMAL LOW (ref 3.87–5.11)
RBC: 3.47 MIL/uL — AB (ref 3.87–5.11)
RDW: 18.8 % — ABNORMAL HIGH (ref 11.5–15.5)
RDW: 18.7 % — ABNORMAL HIGH (ref 11.5–15.5)
WBC: 7 10*3/uL (ref 4.0–10.5)
WBC: 6.5 10*3/uL (ref 4.0–10.5)

## 2016-07-25 LAB — GLUCOSE, CAPILLARY
GLUCOSE-CAPILLARY: 114 mg/dL — AB (ref 65–99)
GLUCOSE-CAPILLARY: 132 mg/dL — AB (ref 65–99)
Glucose-Capillary: 105 mg/dL — ABNORMAL HIGH (ref 65–99)
Glucose-Capillary: 110 mg/dL — ABNORMAL HIGH (ref 65–99)
Glucose-Capillary: 77 mg/dL (ref 65–99)
Glucose-Capillary: 93 mg/dL (ref 65–99)
Glucose-Capillary: 94 mg/dL (ref 65–99)
Glucose-Capillary: 95 mg/dL (ref 65–99)

## 2016-07-25 LAB — MRSA PCR SCREENING: MRSA by PCR: NEGATIVE

## 2016-07-25 LAB — PROTIME-INR
INR: 2.49
PROTHROMBIN TIME: 27.4 s — AB (ref 11.4–15.2)

## 2016-07-25 MED ORDER — KETOROLAC TROMETHAMINE 15 MG/ML IJ SOLN
15.0000 mg | Freq: Once | INTRAMUSCULAR | Status: AC
  Administered 2016-07-25: 15 mg via INTRAVENOUS

## 2016-07-25 MED ORDER — CYCLOBENZAPRINE HCL 5 MG PO TABS
7.5000 mg | ORAL_TABLET | Freq: Three times a day (TID) | ORAL | Status: DC
  Administered 2016-07-25: 7.5 mg via ORAL
  Filled 2016-07-25: qty 2

## 2016-07-25 MED ORDER — HYDROMORPHONE HCL 1 MG/ML IJ SOLN
1.0000 mg | INTRAMUSCULAR | Status: DC | PRN
  Administered 2016-07-25 – 2016-07-26 (×3): 1 mg via INTRAVENOUS
  Filled 2016-07-25 (×3): qty 1

## 2016-07-25 MED ORDER — WARFARIN SODIUM 6 MG PO TABS
6.0000 mg | ORAL_TABLET | Freq: Once | ORAL | Status: AC
  Administered 2016-07-25: 6 mg via ORAL
  Filled 2016-07-25: qty 1

## 2016-07-25 MED ORDER — ACETAMINOPHEN 325 MG PO TABS
650.0000 mg | ORAL_TABLET | Freq: Four times a day (QID) | ORAL | Status: DC | PRN
  Administered 2016-07-25: 650 mg via ORAL
  Filled 2016-07-25: qty 2

## 2016-07-25 MED ORDER — KETOROLAC TROMETHAMINE 15 MG/ML IJ SOLN
15.0000 mg | Freq: Four times a day (QID) | INTRAMUSCULAR | Status: DC | PRN
  Administered 2016-07-25 – 2016-07-26 (×4): 15 mg via INTRAVENOUS
  Filled 2016-07-25 (×5): qty 1

## 2016-07-25 MED ORDER — WARFARIN - PHARMACIST DOSING INPATIENT: Freq: Every day | Status: DC

## 2016-07-25 MED ORDER — CYCLOBENZAPRINE HCL 10 MG PO TABS
10.0000 mg | ORAL_TABLET | Freq: Three times a day (TID) | ORAL | Status: DC
  Administered 2016-07-25 – 2016-07-26 (×5): 10 mg via ORAL
  Filled 2016-07-25 (×6): qty 1

## 2016-07-25 MED ORDER — MORPHINE SULFATE (PF) 4 MG/ML IV SOLN
1.0000 mg | Freq: Once | INTRAVENOUS | Status: AC
  Administered 2016-07-25: 1 mg via INTRAVENOUS
  Filled 2016-07-25: qty 1

## 2016-07-25 NOTE — Progress Notes (Signed)
Pt has an order for soap sud enema , pt stated that she cant do it right now due to severe pain, message MD for another pain medicine , received Toradol 15 mg every 6h at [facility], Dr. Baltazar Najjar ordered another dose of Toradol 15mg  x1

## 2016-07-25 NOTE — Progress Notes (Signed)
Soap sud enema given, with small bowel movement noted, pt still complaining of abdominal pain.

## 2016-07-25 NOTE — Progress Notes (Signed)
Pt has temperature of 101.2 and states Toradol has not helped pain.  MD notified and new orders received.  Will carry out and continue to monitor.  Sarah Moss

## 2016-07-25 NOTE — Progress Notes (Signed)
ANTICOAGULATION CONSULT NOTE - Initial Consult  Pharmacy Consult for Coumadin Indication: atrial fibrillation  Allergies  Allergen Reactions  . Codeine Itching and Nausea And Vomiting  . Ativan [Lorazepam] Other (See Comments)    "Makes me confused and crazy"  . Ambien [Zolpidem] Other (See Comments)    "Has the opposite effect; makes me more alert/hyper"   Assessment: 76 yo F presents on 7/11 with LLQ pain. On Coumadin 6mg  daily for Afib. Last dose was 7/10. INR is therapeutic at 2.49 today. Hgb 10, plts wnl.  Goal of Therapy:  INR 2-3 Monitor platelets by anticoagulation protocol: Yes   Plan: Give Coumadin 6mg  PO x 1 early this am Monitor daily INR, CBC, s/s of bleed   Elenor Quinones, PharmD, BCPS Clinical Pharmacist Pager [phone] 07/25/2016 3:12 AM

## 2016-07-25 NOTE — Progress Notes (Signed)
[facility]  PROGRESS NOTE  Sarah Moss WIO:[id number] DOB: Feb 07, 1940 DOA: 07/24/2016 PCP: Wenda Low, MD   Brief HPI:    76 y.o. female with medical history significant of COPD, DM2, GERD, hx R BKA, HTN, chronic systolic diastolic HTN. Of note, patient is poor historian. Patient's sister in room who reports patient has been refusing bowel regimen regularly. Patient noted to have a hx of significant constipation 2-3 weeks prior to admission with resultant abd pain, resolved with large BM. Unknown if patient has been constipated at facility   Subjective   This morning patient continues to have pain and left flank region, worse on movement. Patient does have history of chronic back pain and underwent 2 back surgeries in the past.   Assessment/Plan:     1. Abdominal pain/left flank pain- patient had CT abdomen pelvis with contrast which showed no acute abnormality. No stone seen no previous history of kidney stones. Patient does have history of chronic back pain and had 2 back surgeries. Lumbar spine x-ray obtained today showed, moderately severe lumbar degenerative changes most severe at L2-3 and L5-S1. No acute fracture noted. Will continue with pain management. Dilaudid 1 mg every 4 hours when necessary 2. Fever-patient developed temperature of 101.2, UA at the time of admission was negative, will obtain blood cultures, urine culture, chest x-ray. Follow CBC in a.m. No signs and symptoms of infection. We'll monitor. No antibiotics at this time. 3. Diabetes mellitus-continue sliding scale insulin with NovoLog. Blood glucose is well controlled. Last glucose 105, 132 4. Ileus- CT abdomen pelvis showed possible ileus. Patient received an enema yesterday. She has no nausea or vomiting. Continue with IV normal saline at 75 ml per hour. 5. Atrial fibrillation- heart rate is controlled, continue beta blockers. Continue Coumadin per pharmacy consultation. 6. Chronic diastolic  CHF-stable, continue gentle IV hydration. Lasix is currently hold. 7. Hypertension-blood pressure stable, continue lisinopril, Coreg.    DVT prophylaxis: Warfarin  Code Status: Full code  Family Communication: No family at bedside   Disposition Plan: Discharge to skilled facility when medically stable   Consultants:  None  Procedures:  None  Continuous infusions . sodium chloride 75 mL/hr at 07/24/16 1733      Antibiotics:   Anti-infectives    None       Objective   Vitals:   07/25/16 0045 07/25/16 0438 07/25/16 0819 07/25/16 1419  BP: (!) 104/54 (!) 112/50  (!) 120/58  Pulse: 73 70  82  Resp: 14 14  16   Temp: 98.2 F (36.8 C) 98.3 F (36.8 C)  (!) 101.2 F (38.4 C)  TempSrc: Oral Oral  Oral  SpO2: 95% 93% 92% 96%  Weight: 63.5 kg (140 lb)     Height: 5\' 5"  (1.651 m)       Intake/Output Summary (Last 24 hours) at 07/25/16 1604 Last data filed at 07/25/16 1419  Gross per 24 hour  Intake              250 ml  Output              600 ml  Net             -350 ml   Filed Weights   07/25/16 0045  Weight: 63.5 kg (140 lb)     Physical Examination:   Physical Exam: Eyes: No icterus, extraocular muscles intact  Mouth: Oral mucosa is moist, no lesions on palate,  Neck: Supple, no deformities, masses, or tenderness Lungs: Normal respiratory  effort, bilateral clear to auscultation, no crackles or wheezes.  Heart: Regular rate and rhythm, S1 and S2 normal, no murmurs, rubs auscultated Abdomen: BS normoactive,soft,nondistended,non-tender to palpation,no organomegaly. Back- positive left CVA tenderness, muscle spasm Extremities: No pretibial edema, no erythema, no cyanosis, no clubbing Neuro : Alert and oriented to time, place and person, No focal deficits  Skin: No rashes seen on exam     Data Reviewed: I have personally reviewed following labs and imaging studies  CBG:  Recent Labs Lab 07/25/16 0050 07/25/16 0350 07/25/16 0434  07/25/16 0757 07/25/16 1232  GLUCAP 94 114* 110* 105* 132*    CBC:  Recent Labs Lab 07/24/16 1633 07/25/16 0044 07/25/16 0434  WBC 7.3 6.5 7.0  HGB 10.3* 10.0* 9.6*  HCT 31.8* 30.7* 29.4*  MCV 83.9 85.8 84.7  PLT 169 190 409    Basic Metabolic Panel:  Recent Labs Lab 07/24/16 1633 07/25/16 0044  NA 133* 135  K 4.3 4.6  CL 99* 102  CO2 23 22  GLUCOSE 159* 97  BUN 20 20  CREATININE 0.90 0.94  CALCIUM 8.7* 8.6*    Recent Results (from the past 240 hour(s))  MRSA PCR Screening     Status: None   Collection Time: 07/25/16  3:26 AM  Result Value Ref Range Status   MRSA by PCR NEGATIVE NEGATIVE Final    Comment:        The GeneXpert MRSA Assay (FDA approved for NASAL specimens only), is one component of a comprehensive MRSA colonization surveillance program. It is not intended to diagnose MRSA infection nor to guide or monitor treatment for MRSA infections.      Liver Function Tests:  Recent Labs Lab 07/24/16 1633 07/25/16 0044  AST 28 26  ALT 18 19  ALKPHOS 80 79  BILITOT 0.6 0.6  PROT 6.6 6.4*  ALBUMIN 3.4* 3.3*    Recent Labs Lab 07/24/16 1633  LIPASE 51   No results for input(s): AMMONIA in the last 168 hours.  Cardiac Enzymes: No results for input(s): CKTOTAL, CKMB, CKMBINDEX, TROPONINI in the last 168 hours. BNP (last 3 results)  Recent Labs  03/05/16 0800 03/18/16 1132  BNP 842.0* 765.5*    ProBNP (last 3 results) No results for input(s): PROBNP in the last 8760 hours.    Studies: Dg Chest 2 View  Result Date: 07/25/2016 CLINICAL DATA:  76 year old with acute onset of fever. Current history of chronic diastolic CHF. EXAM: CHEST  2 VIEW COMPARISON:  03/18/2016 and earlier, including CT chest 07/16/2009. FINDINGS: AP erect and lateral images were obtained. Cardiac silhouette moderately enlarged, unchanged since earlier this year though increased in size since 2016. Thoracic aorta atherosclerotic, unchanged. Hilar and  mediastinal contours otherwise unremarkable. Dual lead left subclavian transvenous pacemaker with the lead tips at the expected location of the right atrial appendage and the lateral right ventricle. Mildly prominent bronchovascular markings diffusely and mild central peribronchial thickening, unchanged. Lungs otherwise clear. Pulmonary vascularity normal without evidence of pulmonary edema. Small bilateral pleural effusions, best seen on the lateral image. IMPRESSION: 1. Small bilateral pleural effusions. No acute cardiopulmonary disease otherwise. 2. Moderate cardiomegaly without evidence of pulmonary edema. 3. Thoracic aortic atherosclerosis. 4. Stable mild changes of chronic bronchitis and/or asthma. Electronically Signed   By: Evangeline Dakin M.D.   On: 07/25/2016 15:26   Dg Lumbar Spine Complete  Result Date: 07/25/2016 CLINICAL DATA:  Low back pain EXAM: LUMBAR SPINE - COMPLETE 4+ VIEW COMPARISON:  CT abdomen pelvis 07/24/2016 FINDINGS: Mild  anterolisthesis L4-5. Advanced disc degeneration at L2-3 with disc space narrowing and spurring. Moderate disc degeneration at L1-2, L3-4, and L5-S1. Mild degenerative change L5-S1. Negative for fracture. Atherosclerotic disease. Right renal artery stent. Bilateral common iliac artery stents. Normal bowel gas pattern. Contrast in urinary bladder from recent CT. No hydronephrosis. IMPRESSION: Moderately severe lumbar degenerative change most severe at L2-3 and L5-S1. Negative for fracture. Electronically Signed   By: Franchot Gallo M.D.   On: 07/25/2016 09:24   Ct Abdomen Pelvis W Contrast  Result Date: 07/24/2016 CLINICAL DATA:  Left lower quadrant pain with nausea EXAM: CT ABDOMEN AND PELVIS WITH CONTRAST TECHNIQUE: Multidetector CT imaging of the abdomen and pelvis was performed using the standard protocol following bolus administration of intravenous contrast. CONTRAST:  163mL ISOVUE-300 IOPAMIDOL (ISOVUE-300) INJECTION 61% COMPARISON:  09/02/2010, 07/16/2010  FINDINGS: Lower chest: Small bilateral pleural effusions. Cardiomegaly. Hazy edema or atelectasis within the posterior lung bases. Hepatobiliary: Nonvisualized gallbladder presumably due to surgical absence. Mild intra hepatic biliary dilatation. Enlarged extrahepatic common bile duct, measuring up to 15 mm. Pancreas: Unremarkable. No pancreatic ductal dilatation or surrounding inflammatory changes. Spleen: Normal in size without focal abnormality. Adrenals/Urinary Tract: Adrenal glands are within normal limits. Atrophic right kidney. Cortical scarring lower pole left kidney. No hydronephrosis. Bladder normal Stomach/Bowel: Stomach is nonenlarged. Borderline enlarged fluid-filled loops of small bowel measuring up to 2.8 cm in the lower abdomen and upper pelvis with gradual transition to decompressed distal small bowel loops. No colon wall thickening. Appendix not well identified but no right lower quadrant inflammation. Vascular/Lymphatic: Heavily calcified aorta without aneurysmal dilatation. Bilateral iliac stents. No significantly enlarged abdominal or pelvic lymph nodes. Reproductive: Prostate is unremarkable. Other: No free air or free fluid. Musculoskeletal: Degenerative changes of the spine. No acute or suspicious bone lesion. IMPRESSION: 1. There are borderline enlarged fluid-filled loops of small bowel in the lower abdomen and upper pelvis with decompressed distal small bowel loops. Findings could be secondary to ileus although developing bowel obstruction is also a consideration. 2. There are small bilateral pleural effusions. Cardiomegaly with hazy atelectasis or edema at the bases 3. Mild to moderate intra and extrahepatic biliary dilatation, nonvisualized gallbladder which is presumed to be surgically absent. Findings could be related to post cholecystectomy change however suggest correlation with laboratory values. This is progressed since previous CT from 2012. 4. Atrophic right kidney with cortical  scarring in the lower pole of the left kidney. No hydronephrosis. Electronically Signed   By: Donavan Foil M.D.   On: 07/24/2016 22:15    Scheduled Meds: . aspirin EC  81 mg Oral Daily  . atorvastatin  40 mg Oral Daily  . carvedilol  12.5 mg Oral BID WC  . cyclobenzaprine  10 mg Oral TID  . DULoxetine  60 mg Oral Daily  . gabapentin  300 mg Oral QHS  . insulin aspart  0-15 Units Subcutaneous Q4H  . levothyroxine  25 mcg Oral QAC breakfast  . lisinopril  20 mg Oral Daily  . mometasone-formoterol  2 puff Inhalation BID  . pantoprazole  40 mg Oral Daily  . spironolactone  12.5 mg Oral Daily  . Warfarin - Pharmacist Dosing Inpatient   Does not apply q1800      Time spent: 25 min  [facility] Pager [phone]. If 7PM-7AM, please contact night-coverage at [email or web address], Office  [phone]  password [email or web address]  07/25/2016, 4:04 PM  LOS: 1 day

## 2016-07-26 DIAGNOSIS — M5442 Lumbago with sciatica, left side: Secondary | ICD-10-CM

## 2016-07-26 DIAGNOSIS — R509 Fever, unspecified: Secondary | ICD-10-CM

## 2016-07-26 LAB — PROTIME-INR
INR: 2.56
Prothrombin Time: 28 seconds — ABNORMAL HIGH (ref 11.4–15.2)

## 2016-07-26 LAB — URINALYSIS, ROUTINE W REFLEX MICROSCOPIC
Bilirubin Urine: NEGATIVE
Glucose, UA: NEGATIVE mg/dL
Hgb urine dipstick: NEGATIVE
Ketones, ur: NEGATIVE mg/dL
Leukocytes, UA: NEGATIVE
Nitrite: NEGATIVE
Protein, ur: NEGATIVE mg/dL
Specific Gravity, Urine: 1.021 (ref 1.005–1.030)
pH: 5 (ref 5.0–8.0)

## 2016-07-26 LAB — GLUCOSE, CAPILLARY
GLUCOSE-CAPILLARY: 113 mg/dL — AB (ref 65–99)
GLUCOSE-CAPILLARY: 95 mg/dL (ref 65–99)
Glucose-Capillary: 82 mg/dL (ref 65–99)
Glucose-Capillary: 79 mg/dL (ref 65–99)
Glucose-Capillary: 134 mg/dL — ABNORMAL HIGH (ref 65–99)

## 2016-07-26 LAB — BASIC METABOLIC PANEL
ANION GAP: 8 (ref 5–15)
BUN: 23 mg/dL — ABNORMAL HIGH (ref 6–20)
CALCIUM: 8.4 mg/dL — AB (ref 8.9–10.3)
CO2: 22 mmol/L (ref 22–32)
Chloride: 108 mmol/L (ref 101–111)
Creatinine, Ser: 1.07 mg/dL — ABNORMAL HIGH (ref 0.44–1.00)
GFR calc Af Amer: 57 mL/min — ABNORMAL LOW (ref >60)
GFR calc non Af Amer: 49 mL/min — ABNORMAL LOW (ref >60)
GLUCOSE: 77 mg/dL (ref 65–99)
POTASSIUM: 4.3 mmol/L (ref 3.5–5.1)
Sodium: 138 mmol/L (ref 135–145)

## 2016-07-26 LAB — URINE CULTURE

## 2016-07-26 LAB — CBC
HCT: 29.5 % — ABNORMAL LOW (ref 36.0–46.0)
Hemoglobin: 9.6 g/dL — ABNORMAL LOW (ref 12.0–15.0)
MCH: 27.9 pg (ref 26.0–34.0)
MCHC: 32.5 g/dL (ref 30.0–36.0)
MCV: 85.8 fL (ref 78.0–100.0)
Platelets: 153 10*3/uL (ref 150–400)
RBC: 3.44 MIL/uL — ABNORMAL LOW (ref 3.87–5.11)
RDW: 19.3 % — AB (ref 11.5–15.5)
WBC: 5.3 10*3/uL (ref 4.0–10.5)

## 2016-07-26 MED ORDER — OXYCODONE-ACETAMINOPHEN 5-325 MG PO TABS
1.0000 | ORAL_TABLET | ORAL | Status: DC | PRN
  Administered 2016-07-26 – 2016-07-27 (×3): 2 via ORAL
  Filled 2016-07-26 (×3): qty 2

## 2016-07-26 MED ORDER — WARFARIN SODIUM 6 MG PO TABS
6.0000 mg | ORAL_TABLET | Freq: Once | ORAL | Status: AC
  Administered 2016-07-26: 6 mg via ORAL
  Filled 2016-07-26: qty 1

## 2016-07-26 MED ORDER — HYDROMORPHONE HCL 1 MG/ML IJ SOLN
1.0000 mg | Freq: Once | INTRAMUSCULAR | Status: AC
  Administered 2016-07-26: 1 mg via INTRAVENOUS
  Filled 2016-07-26: qty 1

## 2016-07-26 MED ORDER — OXYCODONE-ACETAMINOPHEN 5-325 MG PO TABS
1.0000 | ORAL_TABLET | Freq: Four times a day (QID) | ORAL | Status: DC | PRN
  Administered 2016-07-26 (×2): 2 via ORAL
  Filled 2016-07-26 (×2): qty 2

## 2016-07-26 MED ORDER — IBUPROFEN 600 MG PO TABS
600.0000 mg | ORAL_TABLET | Freq: Once | ORAL | Status: AC
  Administered 2016-07-26: 600 mg via ORAL
  Filled 2016-07-26: qty 1

## 2016-07-26 NOTE — Clinical Social Work Placement (Signed)
   CLINICAL SOCIAL WORK PLACEMENT  NOTE  Date:  07/26/2016  Patient Details  Name: Sarah Moss MRN: [id number] Date of Birth: [birth year]  Clinical Social Work is seeking post-discharge placement for this patient at the [place] level of care (*CSW will initial, date and re-position this form in  chart as items are completed):      Patient/family provided with [facility] Work Department's list of facilities offering this level of care within the geographic area requested by the patient (or if unable, by the patient's family).  Yes   Patient/family informed of their freedom to choose among providers that offer the needed level of care, that participate in Medicare, Medicaid or managed care program needed by the patient, have an available bed and are willing to accept the patient.      Patient/family informed of [facility]'s ownership interest in [facility] and [facility], as well as of the fact that they are under no obligation to receive care at these facilities.  PASRR submitted to EDS on       PASRR number received on 07/26/16     Existing PASRR number confirmed on       FL2 transmitted to all facilities in geographic area requested by pt/family on 07/26/16     FL2 transmitted to all facilities within larger geographic area on       Patient informed that his/her managed care company has contracts with or will negotiate with certain facilities, including the following:        Yes   Patient/family informed of bed offers received.  Patient chooses bed at [facility]     Physician recommends and patient chooses bed at      Patient to be transferred to [facility] on  .  Patient to be transferred to facility by [facility]     Patient family notified on   of transfer.  Name of family member notified:        PHYSICIAN       Additional Comment:     _______________________________________________ Eileen Stanford, LCSW 07/26/2016, 11:34 AM

## 2016-07-26 NOTE — Clinical Social Work Note (Signed)
Clinical Social Work Assessment  Patient Details  Name: Sarah Moss MRN: [id number] Date of Birth: October 06, 1940  Date of referral:  07/26/16               Reason for consult:  Facility Placement                Permission sought to share information with:  Family Supports Permission granted to share information::     Name::        Agency::     Relationship::     Contact Information:     Housing/Transportation Living arrangements for the past 2 months:  [place] of Information:  Patient Patient Interpreter Needed:  None Criminal Activity/Legal Involvement Pertinent to Current Situation/Hospitalization:  No - Comment as needed Significant Relationships:  Siblings, Adult Children Lives with:  Adult Children Do you feel safe going back to the place where you live?    Need for family participation in patient care:     Care giving concerns:  No family or friends present at bedside during initial assessment.    Social Worker assessment / plan:  CSW spoke with pt at bedside to complete initial assessment. Pt is from [facility]. Pt is agreeable to return to [facility] at d/c. Pt has two sisters whom live in [place]. CSW will initiate insurance auth for return to [facility]. Facility aware.   Employment status:  Retired Nurse, adult PT Recommendations:  Not assessed at this time [place] / Referral to community resources:  [place]  Patient/Family's Response to care:  Pt verbalized understanding of CSW role and expressed appreciation for support. Pt denies any concern regarding pt care at this time.   Patient/Family's Understanding of and Emotional Response to Diagnosis, Current Treatment, and Prognosis:  Pt understanding and realistic regarding physical limitations. Pt understands the need for SNF placement at d/c. Pt agreeable to SNF placement at d/c, at this time. Pt's responses emotionally appropriate  during conversation with CSW. Pt denies any concern regarding treatment plan at this time. CSW will continue to provide support and facilitate d/c needs.   Emotional Assessment Appearance:  Appears stated age Attitude/Demeanor/Rapport:   (Patient was appropriate.) Affect (typically observed):  Accepting, Appropriate, Calm Orientation:  Oriented to Self, Oriented to  Time, Oriented to Place, Oriented to Situation Alcohol / Substance use:  Not Applicable Psych involvement (Current and /or in the community):  No (Comment)  Discharge Needs  Concerns to be addressed:  Care Coordination Readmission within the last 30 days:  No Current discharge risk:  Dependent with Mobility Barriers to Discharge:  Ship broker, Continued Medical Work up   [employer], LCSW 07/26/2016, 11:32 AM

## 2016-07-26 NOTE — Progress Notes (Signed)
ANTICOAGULATION CONSULT NOTE - Follow Up Consult  Pharmacy Consult:  Coumadin Indication: atrial fibrillation  Allergies  Allergen Reactions  . Codeine Itching and Nausea And Vomiting  . Ativan [Lorazepam] Other (See Comments)    "Makes me confused and crazy"  . Ambien [Zolpidem] Other (See Comments)    "Has the opposite effect; makes me more alert/hyper"    Patient Measurements: Height: 5\' 5"  (165.1 cm) Weight: 140 lb (63.5 kg) IBW/kg (Calculated) : 57  Vital Signs: Temp: 98.8 F (37.1 C) (07/13 0440) Temp Source: Oral (07/13 0440) BP: 133/50 (07/13 0440) Pulse Rate: 74 (07/13 0440)  Labs:  Recent Labs  07/24/16 1633 07/25/16 0044 07/25/16 0434 07/26/16 0411  HGB 10.3* 10.0* 9.6* 9.6*  HCT 31.8* 30.7* 29.4* 29.5*  PLT 169 190 168 153  LABPROT  --  27.4*  --  28.0*  INR  --  2.49  --  2.56  CREATININE 0.90 0.94  --  1.07*    Estimated Creatinine Clearance: 39.6 mL/min (A) (by C-G formula based on SCr of 1.07 mg/dL (H)).    Assessment: 65 YOF continues on Coumadin for history of AFib.  INR therapeutic on home dose.  No bleeding reported.   Goal of Therapy:  INR 2-3    Plan:  Coumadin 6mg  PO today Daily INR F/U resume home meds, micro data   Zeinab Rodwell D. Mina Marble, PharmD, BCPS Pager:  [phone] 07/26/2016, 9:16 AM

## 2016-07-26 NOTE — Evaluation (Signed)
Physical Therapy Evaluation Patient Details Name: Sarah Moss MRN: [id number] DOB: 08-09-1940 Today's Date: 07/26/2016   History of Present Illness  76 y.o. female with medical history significant of COPD, DM2, GERD, hx R BKA, HTN, chronic systolic diastolic HTN admitted with L flank pain.  Found to have increased stool burden and L2-3 & L5-S1 degenerative arthritic changes.    Clinical Impression  Patient presents with decreased mobility due to pain L lower back into groin.  Feel she will need skilled PT in the acute setting to maximize mobility and return to SNF level rehab at d/c.     Follow Up Recommendations SNF    Equipment Recommendations  None recommended by PT    Recommendations for Other Services       Precautions / Restrictions Precautions Precautions: Fall Precaution Comments: R BKA      Mobility  Bed Mobility Overal bed mobility: Needs Assistance Bed Mobility: Supine to Sit;Sit to Supine     Supine to sit: Min assist;HOB elevated Sit to supine: Min assist;HOB elevated   General bed mobility comments: assist for trunk to sit and L leg to supine due to pain  Transfers Overall transfer level: Needs assistance   Transfers: Stand Pivot Transfers   Stand pivot transfers: Min guard       General transfer comment: bed<>BSC  Ambulation/Gait             General Gait Details: reports unable to tolerate ambulation this session  Stairs            Wheelchair Mobility    Modified Rankin (Stroke Patients Only)       Balance Overall balance assessment: Needs assistance   Sitting balance-Leahy Scale: Good     Standing balance support: During functional activity Standing balance-Leahy Scale: Poor Standing balance comment: UE support during OOB transfer due to pain and not wearing R LE prosthesis                             Pertinent Vitals/Pain Pain Assessment: 0-10 Pain Score: 8  Pain Location: L side lower back around  into groin Pain Descriptors / Indicators: Sharp Pain Intervention(s): Monitored during session    Home Living Family/patient expects to be discharged to:: Skilled nursing facility                 Additional Comments: [facility]    Prior Function Level of Independence: Needs assistance   Gait / Transfers Assistance Needed: at times help to pivot to [facility], but able to go unaided as well, was walking in therapy with prosthesis  ADL's / Homemaking Assistance Needed: able to sponge bathe by herself and shower on her own        Hand Dominance        Extremity/Trunk Assessment   Upper Extremity Assessment Upper Extremity Assessment: Overall WFL for tasks assessed    Lower Extremity Assessment Lower Extremity Assessment: LLE deficits/detail LLE Deficits / Details: painful with antigravity testing, but overall AROM WFL LLE: Unable to fully assess due to pain       Communication   Communication: No difficulties  Cognition Arousal/Alertness: Awake/alert Behavior During Therapy: WFL for tasks assessed/performed Overall Cognitive Status: Within Functional Limits for tasks assessed  General Comments General comments (skin integrity, edema, etc.): R LE prosthesis in room    Exercises     Assessment/Plan    PT Assessment Patient needs continued PT services  PT Problem List Decreased mobility;Decreased strength;Decreased activity tolerance;Pain;Decreased balance;Decreased safety awareness       PT Treatment Interventions DME instruction;Functional mobility training;Balance training;Therapeutic exercise;Therapeutic activities;Patient/family education;Gait training    PT Goals (Current goals can be found in the Care Plan section)  Acute Rehab PT Goals Patient Stated Goal: To return to [facility] PT Goal Formulation: With patient Time For Goal Achievement: 08/02/16 Potential to Achieve Goals: Good     Frequency Min 2X/week   Barriers to discharge        Co-evaluation               AM-PAC PT "6 Clicks" Daily Activity  Outcome Measure Difficulty turning over in bed (including adjusting bedclothes, sheets and blankets)?: A Little Difficulty moving from lying on back to sitting on the side of the bed? : Total Difficulty sitting down on and standing up from a chair with arms (e.g., wheelchair, bedside commode, etc,.)?: Total Help needed moving to and from a bed to chair (including a wheelchair)?: Total Help needed walking in hospital room?: Total Help needed climbing 3-5 steps with a railing? : Total 6 Click Score: 8    End of Session   Activity Tolerance: Patient limited by pain Patient left: in bed;with call bell/phone within reach   PT Visit Diagnosis: Difficulty in walking, not elsewhere classified (R26.2);Pain Pain - Right/Left: Left Pain - part of body:  (L lower back, and groin)    Time: 1031-5945 PT Time Calculation (min) (ACUTE ONLY): 24 min   Charges:   PT Evaluation $PT Eval Moderate Complexity: 1 Procedure PT Treatments $Therapeutic Activity: 8-22 mins   PT G CodesMagda Moss, [place] [phone] 07/26/2016   Sarah Moss 07/26/2016, 5:06 PM

## 2016-07-26 NOTE — Discharge Instructions (Signed)

## 2016-07-26 NOTE — Progress Notes (Signed)
[facility]  PROGRESS NOTE  Sarah Moss HLK:[id number] DOB: [birth year] DOA: 07/24/2016 PCP: Wenda Low, MD   Brief HPI:    76 y.o. female with medical history significant of COPD, DM2, GERD, hx R BKA, HTN, chronic systolic diastolic HTN. Of note, patient is poor historian. Patient's sister in room who reports patient has been refusing bowel regimen regularly. Patient noted to have a hx of significant constipation 2-3 weeks prior to admission with resultant abd pain, resolved with large BM. Unknown if patient has been constipated at facility   Subjective   This morning patient continues to have back pain. Lumbar spine x-ray done yesterday, showed moderately severe lumbar degenerative change most severe at L2-3 and L5-S1. Negative for fracture.   Assessment/Plan:     1. Left flank pain- patient had CT abdomen pelvis with contrast which showed no acute abnormality. No stone seen no previous history of kidney stones. Patient does have history of chronic back pain and had 2 back surgeries. Lumbar spine x-ray obtained today showed, moderately severe lumbar degenerative changes most severe at L2-3 and L5-S1. No acute fracture noted. Will continue with pain management. We'll start Percocet 1-2 tablets every 6 hours when necessary. Will obtain PT OT evaluation 2. Fever-resolved, patient developed temperature of 101.2 yesterday, UA at the time of admission was negative,  blood cultures, urine culture, chest x-ray obtained.blood culture and urine culture are negative to date. Chest x-ray shows no pneumonia. WBC normal this morning 5.3. No signs and symptoms of infection. We'll monitor. No antibiotics at this time. 3. Diabetes mellitus-continue sliding scale insulin with NovoLog. Blood glucose is well controlled. Last glucose 105, 132 4. Ileus- CT abdomen pelvis showed possible ileus. Patient received an enema yesterday. She has no nausea or vomiting. Continue with IV normal saline at 75 ml  per hour. We'll start clear liquid diet today. Advance as tolerated 5. Atrial fibrillation- heart rate is controlled, continue beta blockers. Continue Coumadin per pharmacy consultation. 6. Chronic diastolic CHF-stable,  Lasix is currently on hold. 7. Hypertension-blood pressure stable, continue lisinopril, Coreg.    DVT prophylaxis: Warfarin  Code Status: Full code  Family Communication: No family at bedside   Disposition Plan: Discharge to skilled facility when medically stable   Consultants:  None  Procedures:  None  Continuous infusions . sodium chloride 75 mL/hr at 07/26/16 1317      Antibiotics:   Anti-infectives    None       Objective   Vitals:   07/25/16 1419 07/25/16 2250 07/26/16 0440 07/26/16 0917  BP: (!) 120/58 (!) 141/55 (!) 133/50   Pulse: 82 78 74   Resp: 16 14 16    Temp: (!) 101.2 F (38.4 C) 98.7 F (37.1 C) 98.8 F (37.1 C)   TempSrc: Oral Oral Oral   SpO2: 96% 96% 91% 95%  Weight:      Height:        Intake/Output Summary (Last 24 hours) at 07/26/16 1435 Last data filed at 07/26/16 1152  Gross per 24 hour  Intake          2893.75 ml  Output              800 ml  Net          2093.75 ml   Filed Weights   07/25/16 0045  Weight: 63.5 kg (140 lb)     Physical Examination:   Physical Exam: Eyes: No icterus, extraocular muscles intact  Mouth: Oral mucosa is moist, no  lesions on palate,  Neck: Supple, no deformities, masses, or tenderness Lungs: Normal respiratory effort, bilateral clear to auscultation, no crackles or wheezes.  Heart: Regular rate and rhythm, S1 and S2 normal, no murmurs, rubs auscultated Abdomen: BS normoactive,soft,nondistended,non-tender to palpation,no organomegaly Extremities: No pretibial edema, no erythema, no cyanosis, no clubbing Neuro : Alert and oriented to time, place and person, No focal deficits  Skin: No rashes seen on exam     Data Reviewed: I have personally reviewed following labs  and imaging studies  CBG:  Recent Labs Lab 07/25/16 2044 07/26/16 0001 07/26/16 0423 07/26/16 0806 07/26/16 1152  GLUCAP 95 77 82 79 134*    CBC:  Recent Labs Lab 07/24/16 1633 07/25/16 0044 07/25/16 0434 07/26/16 0411  WBC 7.3 6.5 7.0 5.3  HGB 10.3* 10.0* 9.6* 9.6*  HCT 31.8* 30.7* 29.4* 29.5*  MCV 83.9 85.8 84.7 85.8  PLT 169 190 168 676    Basic Metabolic Panel:  Recent Labs Lab 07/24/16 1633 07/25/16 0044 07/26/16 0411  NA 133* 135 138  K 4.3 4.6 4.3  CL 99* 102 108  CO2 23 22 22   GLUCOSE 159* 97 77  BUN 20 20 23*  CREATININE 0.90 0.94 1.07*  CALCIUM 8.7* 8.6* 8.4*    Recent Results (from the past 240 hour(s))  MRSA PCR Screening     Status: None   Collection Time: 07/25/16  3:26 AM  Result Value Ref Range Status   MRSA by PCR NEGATIVE NEGATIVE Final    Comment:        The GeneXpert MRSA Assay (FDA approved for NASAL specimens only), is one component of a comprehensive MRSA colonization surveillance program. It is not intended to diagnose MRSA infection nor to guide or monitor treatment for MRSA infections.   Culture, blood (Routine X 2) w Reflex to ID Panel     Status: None (Preliminary result)   Collection Time: 07/25/16  2:36 PM  Result Value Ref Range Status   Specimen Description BLOOD RIGHT ANTECUBITAL  Final   Special Requests   Final    BOTTLES DRAWN AEROBIC AND ANAEROBIC Blood Culture adequate volume   Culture NO GROWTH < 24 HOURS  Final   Report Status PENDING  Incomplete  Culture, blood (Routine X 2) w Reflex to ID Panel     Status: None (Preliminary result)   Collection Time: 07/25/16  2:36 PM  Result Value Ref Range Status   Specimen Description BLOOD RIGHT HAND  Final   Special Requests   Final    BOTTLES DRAWN AEROBIC AND ANAEROBIC Blood Culture adequate volume   Culture NO GROWTH < 24 HOURS  Final   Report Status PENDING  Incomplete     Liver Function Tests:  Recent Labs Lab 07/24/16 1633 07/25/16 0044  AST 28  26  ALT 18 19  ALKPHOS 80 79  BILITOT 0.6 0.6  PROT 6.6 6.4*  ALBUMIN 3.4* 3.3*    Recent Labs Lab 07/24/16 1633  LIPASE 51   No results for input(s): AMMONIA in the last 168 hours.  Cardiac Enzymes: No results for input(s): CKTOTAL, CKMB, CKMBINDEX, TROPONINI in the last 168 hours. BNP (last 3 results)  Recent Labs  03/05/16 0800 03/18/16 1132  BNP 842.0* 765.5*    ProBNP (last 3 results) No results for input(s): PROBNP in the last 8760 hours.    Studies: Dg Chest 2 View  Result Date: 07/25/2016 CLINICAL DATA:  76 year old with acute onset of fever. Current history of chronic diastolic CHF.  EXAM: CHEST  2 VIEW COMPARISON:  03/18/2016 and earlier, including CT chest 07/16/2009. FINDINGS: AP erect and lateral images were obtained. Cardiac silhouette moderately enlarged, unchanged since earlier this year though increased in size since 2016. Thoracic aorta atherosclerotic, unchanged. Hilar and mediastinal contours otherwise unremarkable. Dual lead left subclavian transvenous pacemaker with the lead tips at the expected location of the right atrial appendage and the lateral right ventricle. Mildly prominent bronchovascular markings diffusely and mild central peribronchial thickening, unchanged. Lungs otherwise clear. Pulmonary vascularity normal without evidence of pulmonary edema. Small bilateral pleural effusions, best seen on the lateral image. IMPRESSION: 1. Small bilateral pleural effusions. No acute cardiopulmonary disease otherwise. 2. Moderate cardiomegaly without evidence of pulmonary edema. 3. Thoracic aortic atherosclerosis. 4. Stable mild changes of chronic bronchitis and/or asthma. Electronically Signed   By: Evangeline Dakin M.D.   On: 07/25/2016 15:26   Dg Lumbar Spine Complete  Result Date: 07/25/2016 CLINICAL DATA:  Low back pain EXAM: LUMBAR SPINE - COMPLETE 4+ VIEW COMPARISON:  CT abdomen pelvis 07/24/2016 FINDINGS: Mild anterolisthesis L4-5. Advanced disc  degeneration at L2-3 with disc space narrowing and spurring. Moderate disc degeneration at L1-2, L3-4, and L5-S1. Mild degenerative change L5-S1. Negative for fracture. Atherosclerotic disease. Right renal artery stent. Bilateral common iliac artery stents. Normal bowel gas pattern. Contrast in urinary bladder from recent CT. No hydronephrosis. IMPRESSION: Moderately severe lumbar degenerative change most severe at L2-3 and L5-S1. Negative for fracture. Electronically Signed   By: Franchot Gallo M.D.   On: 07/25/2016 09:24   Ct Abdomen Pelvis W Contrast  Result Date: 07/24/2016 CLINICAL DATA:  Left lower quadrant pain with nausea EXAM: CT ABDOMEN AND PELVIS WITH CONTRAST TECHNIQUE: Multidetector CT imaging of the abdomen and pelvis was performed using the standard protocol following bolus administration of intravenous contrast. CONTRAST:  110mL ISOVUE-300 IOPAMIDOL (ISOVUE-300) INJECTION 61% COMPARISON:  09/02/2010, 07/16/2010 FINDINGS: Lower chest: Small bilateral pleural effusions. Cardiomegaly. Hazy edema or atelectasis within the posterior lung bases. Hepatobiliary: Nonvisualized gallbladder presumably due to surgical absence. Mild intra hepatic biliary dilatation. Enlarged extrahepatic common bile duct, measuring up to 15 mm. Pancreas: Unremarkable. No pancreatic ductal dilatation or surrounding inflammatory changes. Spleen: Normal in size without focal abnormality. Adrenals/Urinary Tract: Adrenal glands are within normal limits. Atrophic right kidney. Cortical scarring lower pole left kidney. No hydronephrosis. Bladder normal Stomach/Bowel: Stomach is nonenlarged. Borderline enlarged fluid-filled loops of small bowel measuring up to 2.8 cm in the lower abdomen and upper pelvis with gradual transition to decompressed distal small bowel loops. No colon wall thickening. Appendix not well identified but no right lower quadrant inflammation. Vascular/Lymphatic: Heavily calcified aorta without aneurysmal  dilatation. Bilateral iliac stents. No significantly enlarged abdominal or pelvic lymph nodes. Reproductive: Prostate is unremarkable. Other: No free air or free fluid. Musculoskeletal: Degenerative changes of the spine. No acute or suspicious bone lesion. IMPRESSION: 1. There are borderline enlarged fluid-filled loops of small bowel in the lower abdomen and upper pelvis with decompressed distal small bowel loops. Findings could be secondary to ileus although developing bowel obstruction is also a consideration. 2. There are small bilateral pleural effusions. Cardiomegaly with hazy atelectasis or edema at the bases 3. Mild to moderate intra and extrahepatic biliary dilatation, nonvisualized gallbladder which is presumed to be surgically absent. Findings could be related to post cholecystectomy change however suggest correlation with laboratory values. This is progressed since previous CT from 2012. 4. Atrophic right kidney with cortical scarring in the lower pole of the left kidney. No hydronephrosis. Electronically  Signed   By: Donavan Foil M.D.   On: 07/24/2016 22:15    Scheduled Meds: . aspirin EC  81 mg Oral Daily  . atorvastatin  40 mg Oral Daily  . carvedilol  12.5 mg Oral BID WC  . cyclobenzaprine  10 mg Oral TID  . DULoxetine  60 mg Oral Daily  . gabapentin  300 mg Oral QHS  . insulin aspart  0-15 Units Subcutaneous Q4H  . levothyroxine  25 mcg Oral QAC breakfast  . lisinopril  20 mg Oral Daily  . mometasone-formoterol  2 puff Inhalation BID  . pantoprazole  40 mg Oral Daily  . spironolactone  12.5 mg Oral Daily  . warfarin  6 mg Oral ONCE-1800  . Warfarin - Pharmacist Dosing Inpatient   Does not apply q1800      Time spent: 25 min  [facility] Pager [phone]. If 7PM-7AM, please contact night-coverage at [email or web address], Office  [phone]  password [email or web address]  07/26/2016, 2:35 PM  LOS: 2 days

## 2016-07-27 ENCOUNTER — Inpatient Hospital Stay (HOSPITAL_COMMUNITY): Payer: Medicare HMO

## 2016-07-27 DIAGNOSIS — R1032 Left lower quadrant pain: Secondary | ICD-10-CM

## 2016-07-27 DIAGNOSIS — R410 Disorientation, unspecified: Secondary | ICD-10-CM

## 2016-07-27 LAB — CBC
HEMATOCRIT: 27.6 % — AB (ref 36.0–46.0)
HEMOGLOBIN: 9.1 g/dL — AB (ref 12.0–15.0)
MCH: 28.1 pg (ref 26.0–34.0)
MCHC: 33 g/dL (ref 30.0–36.0)
MCV: 85.2 fL (ref 78.0–100.0)
Platelets: 161 10*3/uL (ref 150–400)
RBC: 3.24 MIL/uL — ABNORMAL LOW (ref 3.87–5.11)
RDW: 19 % — ABNORMAL HIGH (ref 11.5–15.5)
WBC: 4.4 10*3/uL (ref 4.0–10.5)

## 2016-07-27 LAB — PROTIME-INR
INR: 2.88
Prothrombin Time: 30.8 seconds — ABNORMAL HIGH (ref 11.4–15.2)

## 2016-07-27 LAB — GLUCOSE, CAPILLARY
GLUCOSE-CAPILLARY: 111 mg/dL — AB (ref 65–99)
GLUCOSE-CAPILLARY: 158 mg/dL — AB (ref 65–99)
GLUCOSE-CAPILLARY: 133 mg/dL — AB (ref 65–99)
GLUCOSE-CAPILLARY: 113 mg/dL — AB (ref 65–99)
Glucose-Capillary: 148 mg/dL — ABNORMAL HIGH (ref 65–99)
Glucose-Capillary: 99 mg/dL (ref 65–99)

## 2016-07-27 MED ORDER — OXYCODONE-ACETAMINOPHEN 5-325 MG PO TABS
1.0000 | ORAL_TABLET | Freq: Three times a day (TID) | ORAL | Status: DC | PRN
  Administered 2016-07-27 – 2016-07-28 (×3): 1 via ORAL
  Filled 2016-07-27 (×3): qty 1

## 2016-07-27 MED ORDER — KETOROLAC TROMETHAMINE 30 MG/ML IJ SOLN
15.0000 mg | Freq: Four times a day (QID) | INTRAMUSCULAR | Status: DC | PRN
  Administered 2016-07-27 – 2016-07-28 (×3): 15 mg via INTRAVENOUS
  Filled 2016-07-27 (×4): qty 1

## 2016-07-27 MED ORDER — INSULIN ASPART 100 UNIT/ML ~~LOC~~ SOLN: 0.0000 [IU] | Freq: Every day | SUBCUTANEOUS | Status: DC

## 2016-07-27 MED ORDER — WARFARIN SODIUM 5 MG PO TABS
5.0000 mg | ORAL_TABLET | Freq: Once | ORAL | Status: AC
  Administered 2016-07-27: 5 mg via ORAL
  Filled 2016-07-27: qty 1

## 2016-07-27 MED ORDER — INSULIN ASPART 100 UNIT/ML ~~LOC~~ SOLN
0.0000 [IU] | Freq: Three times a day (TID) | SUBCUTANEOUS | Status: DC
  Administered 2016-07-27: 2 [IU] via SUBCUTANEOUS
  Administered 2016-07-27: 3 [IU] via SUBCUTANEOUS
  Administered 2016-07-27 – 2016-07-28 (×2): 2 [IU] via SUBCUTANEOUS

## 2016-07-27 NOTE — Progress Notes (Signed)
[facility]  PROGRESS NOTE  Sarah Moss TMH:[id number] DOB: [birth year] DOA: 07/24/2016 PCP: Wenda Low, MD   Brief HPI:    76 y.o. female with medical history significant of COPD, DM2, GERD, hx R BKA, HTN, chronic systolic diastolic HTN. Of note, patient is poor historian. Patient's sister in room who reports patient has been refusing bowel regimen regularly. Patient noted to have a hx of significant constipation 2-3 weeks prior to admission with resultant abd pain, resolved with large BM. Unknown if patient has been constipated at facility   Subjective   Patient this morning is confused, does not know that she is now hospital. Does not month and year. Oriented to person only.   Assessment/Plan:     1. Left flank pain- patient had CT abdomen pelvis with contrast which showed no acute abnormality. No stone seen no previous history of kidney stones. Patient does have history of chronic back pain and had 2 back surgeries. Lumbar spine x-ray obtained today showed, moderately severe lumbar degenerative changes most severe at L2-3 and L5-S1. No acute fracture noted. Will continue with pain management.Dose of Percocet has been reduced due to delirium. PTOT has been consulted. Likely will need to skilled facility. 2. Delirium-patient is confused likely from the multiple opioids for pain control. We'll discontinue Flexeril and cut down the dose of Percocet to one tablet by mouth every 8 hours when necessary 3. Fever-resolved, patient developed temperature of 101.2 yesterday, UA at the time of admission was negative,  blood cultures, urine culture, chest x-ray obtained.blood culture and urine culture are negative to date. Chest x-ray shows no pneumonia. WBC normal this morning 5.3. No signs and symptoms of infection. We'll monitor. No antibiotics at this time. 4. Diabetes mellitus-continue sliding scale insulin with NovoLog. Blood glucose is well controlled. Last glucose 105, 132 5. Ileus-  resolved, CT abdomen pelvis showed possible ileus. Patient received an enema yesterday. She has no nausea or vomiting. Will change IV fluids to [facility]. Diet has been advanced. 6. Atrial fibrillation- heart rate is controlled, continue beta blockers. Continue Coumadin per pharmacy consultation. 7. Chronic diastolic CHF-stable,  Lasix is currently on hold. 8. Hypertension-blood pressure stable, continue lisinopril, Coreg.    DVT prophylaxis: Warfarin  Code Status: Full code  Family Communication: No family at bedside   Disposition Plan: Discharge to skilled facility when medically stable   Consultants:  None  Procedures:  None  Continuous infusions . sodium chloride 10 mL/hr at 07/26/16 1438      Antibiotics:   Anti-infectives    None       Objective   Vitals:   07/27/16 0556 07/27/16 0851 07/27/16 0856 07/27/16 1344  BP: (!) 126/51   (!) 115/53  Pulse: 78   80  Resp: 16   16  Temp: 97.6 F (36.4 C)   97.8 F (36.6 C)  TempSrc: Oral   Oral  SpO2: 99% 90% 93% 100%  Weight:      Height:        Intake/Output Summary (Last 24 hours) at 07/27/16 1730 Last data filed at 07/27/16 1609  Gross per 24 hour  Intake              780 ml  Output             1200 ml  Net             -420 ml   Filed Weights   07/25/16 0045  Weight: 63.5 kg (140 lb)  Physical Examination:   Physical Exam: Eyes: No icterus, extraocular muscles intact  Mouth: Oral mucosa is moist, no lesions on palate,  Neck: Supple, no deformities, masses, or tenderness Lungs: Normal respiratory effort, bilateral clear to auscultation, no crackles or wheezes.  Heart: Regular rate and rhythm, S1 and S2 normal, no murmurs, rubs auscultated Abdomen: BS normoactive,soft,nondistended,non-tender to palpation,no organomegaly Extremities: No pretibial edema, no erythema, no cyanosis, no clubbing Neuro : Alert and oriented to Person. Not oriented to time and place.  Skin: No rashes seen on  exam     Data Reviewed: I have personally reviewed following labs and imaging studies  CBG:  Recent Labs Lab 07/26/16 2349 07/27/16 0346 07/27/16 0753 07/27/16 1153 07/27/16 1700  GLUCAP 99 111* 158* 148* 133*    CBC:  Recent Labs Lab 07/24/16 1633 07/25/16 0044 07/25/16 0434 07/26/16 0411 07/27/16 0458  WBC 7.3 6.5 7.0 5.3 4.4  HGB 10.3* 10.0* 9.6* 9.6* 9.1*  HCT 31.8* 30.7* 29.4* 29.5* 27.6*  MCV 83.9 85.8 84.7 85.8 85.2  PLT 169 190 168 153 716    Basic Metabolic Panel:  Recent Labs Lab 07/24/16 1633 07/25/16 0044 07/26/16 0411  NA 133* 135 138  K 4.3 4.6 4.3  CL 99* 102 108  CO2 23 22 22   GLUCOSE 159* 97 77  BUN 20 20 23*  CREATININE 0.90 0.94 1.07*  CALCIUM 8.7* 8.6* 8.4*    Recent Results (from the past 240 hour(s))  Culture, Urine     Status: Abnormal   Collection Time: 07/24/16  5:08 PM  Result Value Ref Range Status   Specimen Description URINE, RANDOM  Final   Special Requests NONE  Final   Culture MULTIPLE SPECIES PRESENT, SUGGEST RECOLLECTION (A)  Final   Report Status 07/26/2016 FINAL  Final  MRSA PCR Screening     Status: None   Collection Time: 07/25/16  3:26 AM  Result Value Ref Range Status   MRSA by PCR NEGATIVE NEGATIVE Final    Comment:        The GeneXpert MRSA Assay (FDA approved for NASAL specimens only), is one component of a comprehensive MRSA colonization surveillance program. It is not intended to diagnose MRSA infection nor to guide or monitor treatment for MRSA infections.   Culture, blood (Routine X 2) w Reflex to ID Panel     Status: None (Preliminary result)   Collection Time: 07/25/16  2:36 PM  Result Value Ref Range Status   Specimen Description BLOOD RIGHT ANTECUBITAL  Final   Special Requests   Final    BOTTLES DRAWN AEROBIC AND ANAEROBIC Blood Culture adequate volume   Culture NO GROWTH 2 DAYS  Final   Report Status PENDING  Incomplete  Culture, blood (Routine X 2) w Reflex to ID Panel     Status:  None (Preliminary result)   Collection Time: 07/25/16  2:36 PM  Result Value Ref Range Status   Specimen Description BLOOD RIGHT HAND  Final   Special Requests   Final    BOTTLES DRAWN AEROBIC AND ANAEROBIC Blood Culture adequate volume   Culture NO GROWTH 2 DAYS  Final   Report Status PENDING  Incomplete     Liver Function Tests:  Recent Labs Lab 07/24/16 1633 07/25/16 0044  AST 28 26  ALT 18 19  ALKPHOS 80 79  BILITOT 0.6 0.6  PROT 6.6 6.4*  ALBUMIN 3.4* 3.3*    Recent Labs Lab 07/24/16 1633  LIPASE 51   No results for input(s): AMMONIA  in the last 168 hours.  Cardiac Enzymes: No results for input(s): CKTOTAL, CKMB, CKMBINDEX, TROPONINI in the last 168 hours. BNP (last 3 results)  Recent Labs  03/05/16 0800 03/18/16 1132  BNP 842.0* 765.5*    ProBNP (last 3 results) No results for input(s): PROBNP in the last 8760 hours.    Studies: Ct Renal Stone Study  Result Date: 07/27/2016 CLINICAL DATA:  LEFT flank pain and groin for 2 weeks, history type II diabetes mellitus, hypertension, asthma, CHF, COPD, stage III chronic kidney disease, pulmonary hypertension, chronic atrial fibrillation EXAM: CT ABDOMEN AND PELVIS WITHOUT CONTRAST TECHNIQUE: Multidetector CT imaging of the abdomen and pelvis was performed following the standard protocol without IV contrast. Sagittal and coronal MPR images reconstructed from axial data set. Oral contrast not administered for this indication. COMPARISON:  07/24/2016 FINDINGS: Lower chest: Bibasilar pleural effusions and compressive atelectasis of the lower lobes. Hepatobiliary: Gallbladder surgically absent. Liver unremarkable. Mild chronic extrahepatic biliary dilatation CBD 11 mm previously 10 mm Pancreas: Normal appearance Spleen: Normal appearance Adrenals/Urinary Tract: Adrenal glands normal appearance. Atrophic RIGHT kidney. LEFT kidney normal appearance without mass or hydronephrosis. Ureters and bladder unremarkable.  Stomach/Bowel: Appendix not visualized. Stomach and bowel loops unremarkable for technique. Vascular/Lymphatic: Atherosclerotic calcifications aorta, iliac arteries, and abdominal branch vessels. Reproductive: Uterus surgically absent. Nonvisualization of ovaries. Other: No free air, free fluid, hernia, or definite inflammatory process. Musculoskeletal: Scattered degenerative disc and facet disease changes lumbar spine. Schmorl's nodes at superior endplates of L3 and L4. Diffuse osseous demineralization. IMPRESSION: Mild chronic extrahepatic biliary dilatation post cholecystectomy recommend correlation with LFTs. RIGHT renal atrophy. No acute intra-abdominal or intrapelvic abnormalities. Small BILATERAL pleural effusions with bibasilar atelectasis. Aortic Atherosclerosis (ICD10-I70.0). Electronically Signed   By: Lavonia Dana M.D.   On: 07/27/2016 13:47    Scheduled Meds: . aspirin EC  81 mg Oral Daily  . atorvastatin  40 mg Oral Daily  . carvedilol  12.5 mg Oral BID WC  . DULoxetine  60 mg Oral Daily  . gabapentin  300 mg Oral QHS  . insulin aspart  0-15 Units Subcutaneous TID WC  . insulin aspart  0-5 Units Subcutaneous QHS  . levothyroxine  25 mcg Oral QAC breakfast  . lisinopril  20 mg Oral Daily  . mometasone-formoterol  2 puff Inhalation BID  . pantoprazole  40 mg Oral Daily  . spironolactone  12.5 mg Oral Daily  . Warfarin - Pharmacist Dosing Inpatient   Does not apply q1800      Time spent: 25 min  [place] [facility] Pager [phone]. If 7PM-7AM, please contact night-coverage at [email or web address], Office  [phone]  password [email or web address]  07/27/2016, 5:30 PM  LOS: 3 days

## 2016-07-27 NOTE — NC FL2 (Signed)
[place] LEVEL OF CARE SCREENING TOOL     IDENTIFICATION  Patient Name: Sarah Moss Birthdate: January 07, 1941 Sex: female Admission Date (Current Location): 07/24/2016  [facility] [place] Number:  Herbalist and Address:  [facility] [place] [address], [place], [place] [address]      Provider Number: [id number]  Attending Physician Name and Address:  Oswald Hillock, MD  Relative Name and Phone Number:       Current Level of Care: Hospital Recommended Level of Care: [place] Prior Approval Number:    Date Approved/Denied:   PASRR Number: [id number]  Discharge Plan: SNF    Current Diagnoses: Patient Active Problem List   Diagnosis Date Noted  . Ileus ([place]) 07/24/2016  . Permanent atrial fibrillation ([place]) 05/10/2016  . Status post below knee amputation of right lower extremity ([place]) 05/10/2016  . Longstanding persistent atrial fibrillation ([place]) 04/02/2016  . CHB (complete heart block) (HCC) s/p AV node ablation 04/02/2016  . Chronic anticoagulation 03/22/2016  . Microcytic anemia 03/22/2016  . Diabetes mellitus with hemoglobin A1c goal of 7.0%-8.0% ([place])   . Cardiomyopathy ([place]) 03/21/2016  . PAD (peripheral artery disease) ([place])   . Chronic atrial fibrillation ([place]) 03/18/2016  . Amputee, below knee ([place]) 03/18/2016  . Congestive heart failure ([place]) 03/18/2016  . Pulmonary emphysema ([place]) 03/18/2016  . GERD (gastroesophageal reflux disease) 03/18/2016  . Acute systolic heart failure ([place]) 03/18/2016  . Bilateral pleural effusion 02/24/2015  . S/P AV nodal ablation 02/21/2015  . Cardiac pacemaker in situ 02/20/2015  . Urticaria 02/20/2015  . Type 2 diabetes mellitus with complication ([place]) 78/24/2353  . Tachy-brady syndrome ([place]) 12/19/2014  . Normal left ventricular systolic function and wall motion 10/12/2014  . Benign essential hypertension 09/14/2014  . Dyspnea 09/14/2014  . Long-term use of  aspirin therapy 09/14/2014  . Allergic rhinitis 08/03/2014  . DOE (dyspnea on exertion) 08/03/2014  . Pulmonary HTN ([place]) 08/03/2014  . Sleep apnea 08/03/2014  . Sleep apnea with use of continuous positive airway pressure (CPAP) 08/03/2014  . Chronic diastolic CHF (congestive heart failure) ([place]) 05/30/2014  . HTN (hypertension) 11/04/2012  . Hypothyroidism 11/04/2012  . Type 2 diabetes mellitus with diabetic polyneuropathy ([place]) 11/04/2012  . Red blood cell antibody positive 10/28/2011  . Arthropathy 08/17/2009  . Mixed incontinence 08/17/2009  . Benign renovascular hypertension 08/17/2009  . Anxiety state 06/05/2009  . Abnormal finding on thyroid function test 06/01/2009  . Vitamin D deficiency 06/01/2009  . Type II or unspecified type diabetes mellitus without mention of complication, not stated as uncontrolled 05/26/2009  . Asthma 05/24/2009  . Chronic kidney disease, stage III (moderate) 05/24/2009  . Depressive disorder, not elsewhere classified 05/24/2009  . Insomnia 05/24/2009  . Osteoarthritis 05/24/2009  . Osteoporosis 05/24/2009  . Peripheral vascular disease ([place]) 05/24/2009    Orientation RESPIRATION BLADDER Height & Weight     Self, Time, Situation, Place  Normal Continent Weight: 140 lb (63.5 kg) Height:  5\' 5"  (165.1 cm)  BEHAVIORAL SYMPTOMS/MOOD NEUROLOGICAL BOWEL NUTRITION STATUS      Incontinent  (Please see d/c summary)  AMBULATORY STATUS COMMUNICATION OF NEEDS Skin   Extensive Assist Verbally Normal                       Personal Care Assistance Level of Assistance  Bathing, Feeding, Dressing Bathing Assistance: Maximum assistance Feeding assistance: Independent Dressing Assistance: Maximum assistance     Functional Limitations Info  Sight, Hearing, Speech Sight Info: Adequate Hearing Info: Adequate Speech Info: Adequate    SPECIAL CARE FACTORS FREQUENCY  PT (By licensed PT)     PT Frequency: 2xweek              Contractures  Contractures Info: Not present    Additional Factors Info  Code Status, Allergies, Insulin Sliding Scale Code Status Info: Full Code Allergies Info: Codeine, Ativan Lorazepam, Ambien Zolpidem   Insulin Sliding Scale Info: 15 units 3x's a day; 5 units at bed       Current Medications (07/27/2016):  This is the current hospital active medication list Current Facility-Administered Medications  Medication Dose Route Frequency Provider Last Rate Last Dose  . 0.9 %  sodium chloride infusion   Intravenous Continuous Oswald Hillock, MD 10 mL/hr at 07/26/16 1438    . aspirin EC tablet 81 mg  81 mg Oral Daily Donne Hazel, MD   81 mg at 07/27/16 1024  . atorvastatin (LIPITOR) tablet 40 mg  40 mg Oral Daily Donne Hazel, MD   40 mg at 07/27/16 1024  . carvedilol (COREG) tablet 12.5 mg  12.5 mg Oral BID WC Donne Hazel, MD   12.5 mg at 07/27/16 0810  . DULoxetine (CYMBALTA) DR capsule 60 mg  60 mg Oral Daily Donne Hazel, MD   60 mg at 07/27/16 1025  . gabapentin (NEURONTIN) capsule 300 mg  300 mg Oral QHS Donne Hazel, MD   300 mg at 07/26/16 2103  . insulin aspart (novoLOG) injection 0-15 Units  0-15 Units Subcutaneous TID WC Oswald Hillock, MD   2 Units at 07/27/16 1257  . insulin aspart (novoLOG) injection 0-5 Units  0-5 Units Subcutaneous QHS Darrick Meigs, Marge Duncans, MD      . ketorolac (TORADOL) 30 MG/ML injection 15 mg  15 mg Intravenous Q6H PRN Oswald Hillock, MD   15 mg at 07/27/16 1021  . levothyroxine (SYNTHROID, LEVOTHROID) tablet 25 mcg  25 mcg Oral QAC breakfast Donne Hazel, MD   25 mcg at 07/27/16 0810  . lisinopril (PRINIVIL,ZESTRIL) tablet 20 mg  20 mg Oral Daily Donne Hazel, MD   20 mg at 07/27/16 1025  . mometasone-formoterol (DULERA) 200-5 MCG/ACT inhaler 2 puff  2 puff Inhalation BID Donne Hazel, MD   2 puff at 07/27/16 [phone]  . oxyCODONE-acetaminophen (PERCOCET/ROXICET) 5-325 MG per tablet 1 tablet  1 tablet Oral Q8H PRN Oswald Hillock, MD      . pantoprazole  (PROTONIX) EC tablet 40 mg  40 mg Oral Daily Donne Hazel, MD   40 mg at 07/27/16 1024  . spironolactone (ALDACTONE) tablet 12.5 mg  12.5 mg Oral Daily Donne Hazel, MD   12.5 mg at 07/27/16 1025  . warfarin (COUMADIN) tablet 5 mg  5 mg Oral ONCE-1800 Tyrone Apple, RPH      . Warfarin - Pharmacist Dosing Inpatient   Does not apply q1800 [name], [facility]         Discharge Medications: Please see discharge summary for a list of discharge medications.  Relevant Imaging Results:  Relevant Lab Results:   Additional Information SSN: [id number]  Normajean Baxter, LCSW

## 2016-07-27 NOTE — Social Work (Addendum)
CSW updated FL2.  CSW faxed clinicals to [facility] [phone] to obtain insurance auth needed for short term rehab. CSW will continue to follow up.  Elissa Hefty, LCSW Clinical Social Worker [phone]

## 2016-07-27 NOTE — Progress Notes (Signed)
Patient placed on 2L [place] due to patient stating feeling short of breath and oxygen saturation being 90%. Patient is comfortable and states she feels better now. RT will continue to monitor

## 2016-07-27 NOTE — Progress Notes (Signed)
ANTICOAGULATION CONSULT NOTE - Follow Up Consult  Pharmacy Consult:  Coumadin Indication: atrial fibrillation  Allergies  Allergen Reactions  . Codeine Itching and Nausea And Vomiting  . Ativan [Lorazepam] Other (See Comments)    "Makes me confused and crazy"  . Ambien [Zolpidem] Other (See Comments)    "Has the opposite effect; makes me more alert/hyper"    Patient Measurements: Height: 5\' 5"  (165.1 cm) Weight: 140 lb (63.5 kg) IBW/kg (Calculated) : 57  Vital Signs: Temp: 97.6 F (36.4 C) (07/14 0556) Temp Source: Oral (07/14 0556) BP: 126/51 (07/14 0556) Pulse Rate: 78 (07/14 0556)  Labs:  Recent Labs  07/24/16 1633 07/25/16 0044 07/25/16 0434 07/26/16 0411 07/27/16 0458  HGB 10.3* 10.0* 9.6* 9.6* 9.1*  HCT 31.8* 30.7* 29.4* 29.5* 27.6*  PLT 169 190 168 153 161  LABPROT  --  27.4*  --  28.0* 30.8*  INR  --  2.49  --  2.56 2.88  CREATININE 0.90 0.94  --  1.07*  --     Estimated Creatinine Clearance: 39.6 mL/min (A) (by C-G formula based on SCr of 1.07 mg/dL (H)).    Assessment: 36 YOF continues on Coumadin for history of AFib.  INR therapeutic and trending up on home dose.  She is eating 100% of her meals.  No bleeding reported.  Home Coumadin dose:  6mg  PO daily   Goal of Therapy:  INR 2-3    Plan:  Coumadin 5mg  PO today Daily INR F/U resume home meds   Shalayah Beagley D. Mina Marble, PharmD, BCPS Pager:  [phone] 07/27/2016, 11:10 AM

## 2016-07-28 DIAGNOSIS — Z89511 Acquired absence of right leg below knee: Secondary | ICD-10-CM

## 2016-07-28 LAB — CBC
HCT: 28.3 % — ABNORMAL LOW (ref 36.0–46.0)
Hemoglobin: 8.9 g/dL — ABNORMAL LOW (ref 12.0–15.0)
MCH: 26.9 pg (ref 26.0–34.0)
MCHC: 31.4 g/dL (ref 30.0–36.0)
MCV: 85.5 fL (ref 78.0–100.0)
PLATELETS: 168 10*3/uL (ref 150–400)
RBC: 3.31 MIL/uL — AB (ref 3.87–5.11)
RDW: 19.5 % — ABNORMAL HIGH (ref 11.5–15.5)
WBC: 5.8 10*3/uL (ref 4.0–10.5)

## 2016-07-28 LAB — PROTIME-INR
INR: 4.13
Prothrombin Time: 41.1 s — ABNORMAL HIGH (ref 11.4–15.2)

## 2016-07-28 LAB — GLUCOSE, CAPILLARY
Glucose-Capillary: 106 mg/dL — ABNORMAL HIGH (ref 65–99)
Glucose-Capillary: 135 mg/dL — ABNORMAL HIGH (ref 65–99)

## 2016-07-28 MED ORDER — ALPRAZOLAM 0.25 MG PO TABS: 0.2500 mg | ORAL_TABLET | Freq: Two times a day (BID) | ORAL | 0 refills | Status: DC | PRN

## 2016-07-28 MED ORDER — OXYCODONE-ACETAMINOPHEN 5-325 MG PO TABS: 1.0000 | ORAL_TABLET | Freq: Four times a day (QID) | ORAL | 0 refills | Status: AC | PRN

## 2016-07-28 NOTE — Progress Notes (Signed)
Contacted Education officer, museum about patient discharge. She is on [facility] and will get things arranged.

## 2016-07-28 NOTE — Progress Notes (Signed)
Received call from [facility] with lab.  He reports that pt. Has critical PT/INR.  INR- 4.13 and PT-41.3.  I have texted paged K. Sofia.  I have also texted paged G. Lama to make aware of critical lab value.

## 2016-07-28 NOTE — Progress Notes (Signed)
PTAR called for transportation back to [facility] at 2:05PM  K[place]ley Spittle, [facility] Clinical Social Worker [phone]

## 2016-07-28 NOTE — Progress Notes (Signed)
Discharge instructions gone over with patient and her sister. Patient is going to [facility] SNF. Medications, appointments, and diet discussed with family. [facility] given a copy of client's discharge with medication times included.

## 2016-07-28 NOTE — Discharge Summary (Signed)
Physician Discharge Summary  Sarah Moss GYK:[id number] DOB: 1940-03-29 DOA: 07/24/2016  PCP: Wenda Low, MD  Admit date: 07/24/2016 Discharge date: 07/28/2016  Time spent: *40 minutes  Recommendations for Outpatient Follow-up:  1. Patient will be discharged on Percocet 1 tablet by mouth every 6 hours when necessary 2. Continue Lasix 40 mg by mouth daily 3. Continue physical therapy at skilled facility  Discharge Diagnoses:  Active Problems:   Pulmonary emphysema (HCC)   PAD (peripheral artery disease) (HCC)   Longstanding persistent atrial fibrillation (HCC)   Cardiac pacemaker in situ   Chronic diastolic CHF (congestive heart failure) (HCC)   Chronic kidney disease, stage III (moderate)   HTN (hypertension)   Hypothyroidism   Long-term use of aspirin therapy   Pulmonary HTN (HCC)   Sleep apnea with use of continuous positive airway pressure (CPAP)   Type 2 diabetes mellitus with diabetic polyneuropathy (HCC)   Permanent atrial fibrillation ([place])   Status post below knee amputation of right lower extremity ([place])   Ileus ([place])   Discharge Condition: Stable  Diet recommendation: Heart healthy diet  Filed Weights   07/25/16 0045  Weight: 63.5 kg (140 lb)    History of present illness:  76 y.o.femalewith medical history significant of COPD, DM2, GERD, hx R BKA, HTN, chronic systolic diastolic HTN. Of note, patient is poor historian. Patient's sister in room who reports patient has been refusing bowel regimen regularly. Patient noted to have a hx of significant constipation 2-3 weeks prior to admission with resultant abd pain, resolved with large BM. Unknown if patient has been constipated at facility  Hospital Course:   1. Left flank pain- patient had CT abdomen pelvis with contrast which showed no acute abnormality. No stone seen no previous history of kidney stones. Patient does have history of chronic back pain and had 2 back surgeries. Lumbar spine x-ray  obtained  showed, moderately severe lumbar degenerative changes most severe at L2-3 and L5-S1. No acute fracture noted. Will continue with pain management.Dose of Percocet has been reduced due to delirium. PTOT has been consulted. Likely will need to continue physical therapy at skilled facility. CT stone protocol also was ordered which showed no ureteral stone or hydronephrosis. 2. Delirium-resolved, patient is confused likely from the multiple opioids for pain control. Improved after Flexeril was discontinued and Percocet dose reduced to every 8 hours when necessary.  3. Fever-resolved, patient developed temperature of 101.2 on 07/26/2016, X 1  UA at the time of admission was negative,  blood cultures, urine culture, chest x-ray obtained.blood culture and urine culture are negative to date. Chest x-ray shows no pneumonia. WBC normal this morning 5.8. No signs and symptoms of infection. No antibiotics at this time. 4. Diabetes mellitus-patient was started on  sliding scale insulin with NovoLog. Blood glucose is well controlled. Continue metformin. 5. Ileus- resolved, CT abdomen pelvis showed possible ileus. Patient received an enema. She has no nausea or vomiting. She is tolerating her diet well.  6. Atrial fibrillation- heart rate is controlled, continue beta blockers. Continue Coumadin per pharmacy consultation. 7. Chronic diastolic CHF-stable, continue Lasix 40 mg by mouth daily 8. Hypertension-blood pressure stable, continue lisinopril, Coreg.  Procedures:  None  Consultations:  None  Discharge Exam: Vitals:   07/28/16 0513 07/28/16 0917  BP: (!) 122/53   Pulse: 68 67  Resp: 18 18  Temp: 98.3 F (36.8 C)     General: Appears in no acute distress Cardiovascular: S1-S2 regular Respiratory: Clear to auscultation bilaterally  Discharge Instructions   Discharge Instructions    Diet - low sodium heart healthy    Complete by:  As directed    Increase activity slowly    Complete by:   As directed      Current Discharge Medication List    CONTINUE these medications which have CHANGED   Details  ALPRAZolam (XANAX) 0.25 MG tablet Take 1 tablet (0.25 mg total) by mouth every 12 (twelve) hours as needed for anxiety. Qty: 10 tablet, Refills: 0    oxyCODONE-acetaminophen (PERCOCET/ROXICET) 5-325 MG tablet Take 1 tablet by mouth every 6 (six) hours as needed for severe pain. Qty: 10 tablet, Refills: 0      CONTINUE these medications which have NOT CHANGED   Details  ADVAIR DISKUS 250-50 MCG/DOSE AEPB Inhale 1 puff into the lungs 2 (two) times daily.    aspirin EC 81 MG EC tablet Take 1 tablet (81 mg total) by mouth daily.    atorvastatin (LIPITOR) 40 MG tablet Take 40 mg by mouth daily.    carvedilol (COREG) 12.5 MG tablet Take 1 tablet (12.5 mg total) by mouth 2 (two) times daily with a meal. Qty: 60 tablet, Refills: 9    DULoxetine (CYMBALTA) 60 MG capsule Take 60 mg by mouth daily.    furosemide (LASIX) 40 MG tablet Take 40 mg by mouth daily.    gabapentin (NEURONTIN) 300 MG capsule Take 300 mg by mouth at bedtime.    iron polysaccharides (NIFEREX) 150 MG capsule Take 1 capsule (150 mg total) by mouth daily.    levothyroxine (SYNTHROID, LEVOTHROID) 25 MCG tablet Take 25 mcg by mouth daily.    lisinopril (PRINIVIL,ZESTRIL) 20 MG tablet Take 20 mg by mouth daily.    metFORMIN (GLUCOPHAGE) 1000 MG tablet Take 1,000 mg by mouth 2 (two) times daily.    montelukast (SINGULAIR) 10 MG tablet Take 10 mg by mouth at bedtime.    nitroGLYCERIN (NITROSTAT) 0.4 MG SL tablet Place 0.4 mg under the tongue every 5 (five) minutes as needed for chest pain.    omeprazole (PRILOSEC) 40 MG capsule Take 40 mg by mouth daily.    polyethylene glycol (MIRALAX / GLYCOLAX) packet Take 17 g by mouth 2 (two) times daily. Qty: 14 each, Refills: 0    senna-docusate (SENOKOT-S) 8.6-50 MG tablet Take 1 tablet by mouth 2 (two) times daily.    spironolactone (ALDACTONE) 25 MG tablet  Take 12.5 mg by mouth daily.     VENTOLIN HFA 108 (90 Base) MCG/ACT inhaler Inhale 1 puff into the lungs every 6 (six) hours as needed for shortness of breath.     Vitamin D, Ergocalciferol, (DRISDOL) 50000 units CAPS capsule Take 50,000 Units by mouth 2 (two) times a week.    warfarin (COUMADIN) 6 MG tablet Take 6 mg by mouth daily.      STOP taking these medications     UNABLE TO FIND        Allergies  Allergen Reactions  . Codeine Itching and Nausea And Vomiting  . Ativan [Lorazepam] Other (See Comments)    "Makes me confused and crazy"  . Ambien [Zolpidem] Other (See Comments)    "Has the opposite effect; makes me more alert/hyper"      The results of significant diagnostics from this hospitalization (including imaging, microbiology, ancillary and laboratory) are listed below for reference.    Significant Diagnostic Studies: Dg Chest 2 View  Result Date: 07/25/2016 CLINICAL DATA:  76 year old with acute onset of fever. Current history of  chronic diastolic CHF. EXAM: CHEST  2 VIEW COMPARISON:  03/18/2016 and earlier, including CT chest 07/16/2009. FINDINGS: AP erect and lateral images were obtained. Cardiac silhouette moderately enlarged, unchanged since earlier this year though increased in size since 2016. Thoracic aorta atherosclerotic, unchanged. Hilar and mediastinal contours otherwise unremarkable. Dual lead left subclavian transvenous pacemaker with the lead tips at the expected location of the right atrial appendage and the lateral right ventricle. Mildly prominent bronchovascular markings diffusely and mild central peribronchial thickening, unchanged. Lungs otherwise clear. Pulmonary vascularity normal without evidence of pulmonary edema. Small bilateral pleural effusions, best seen on the lateral image. IMPRESSION: 1. Small bilateral pleural effusions. No acute cardiopulmonary disease otherwise. 2. Moderate cardiomegaly without evidence of pulmonary edema. 3. Thoracic  aortic atherosclerosis. 4. Stable mild changes of chronic bronchitis and/or asthma. Electronically Signed   By: Evangeline Dakin M.D.   On: 07/25/2016 15:26   Dg Lumbar Spine Complete  Result Date: 07/25/2016 CLINICAL DATA:  Low back pain EXAM: LUMBAR SPINE - COMPLETE 4+ VIEW COMPARISON:  CT abdomen pelvis 07/24/2016 FINDINGS: Mild anterolisthesis L4-5. Advanced disc degeneration at L2-3 with disc space narrowing and spurring. Moderate disc degeneration at L1-2, L3-4, and L5-S1. Mild degenerative change L5-S1. Negative for fracture. Atherosclerotic disease. Right renal artery stent. Bilateral common iliac artery stents. Normal bowel gas pattern. Contrast in urinary bladder from recent CT. No hydronephrosis. IMPRESSION: Moderately severe lumbar degenerative change most severe at L2-3 and L5-S1. Negative for fracture. Electronically Signed   By: Franchot Gallo M.D.   On: 07/25/2016 09:24   Ct Abdomen Pelvis W Contrast  Result Date: 07/24/2016 CLINICAL DATA:  Left lower quadrant pain with nausea EXAM: CT ABDOMEN AND PELVIS WITH CONTRAST TECHNIQUE: Multidetector CT imaging of the abdomen and pelvis was performed using the standard protocol following bolus administration of intravenous contrast. CONTRAST:  114mL ISOVUE-300 IOPAMIDOL (ISOVUE-300) INJECTION 61% COMPARISON:  09/02/2010, 07/16/2010 FINDINGS: Lower chest: Small bilateral pleural effusions. Cardiomegaly. Hazy edema or atelectasis within the posterior lung bases. Hepatobiliary: Nonvisualized gallbladder presumably due to surgical absence. Mild intra hepatic biliary dilatation. Enlarged extrahepatic common bile duct, measuring up to 15 mm. Pancreas: Unremarkable. No pancreatic ductal dilatation or surrounding inflammatory changes. Spleen: Normal in size without focal abnormality. Adrenals/Urinary Tract: Adrenal glands are within normal limits. Atrophic right kidney. Cortical scarring lower pole left kidney. No hydronephrosis. Bladder normal  Stomach/Bowel: Stomach is nonenlarged. Borderline enlarged fluid-filled loops of small bowel measuring up to 2.8 cm in the lower abdomen and upper pelvis with gradual transition to decompressed distal small bowel loops. No colon wall thickening. Appendix not well identified but no right lower quadrant inflammation. Vascular/Lymphatic: Heavily calcified aorta without aneurysmal dilatation. Bilateral iliac stents. No significantly enlarged abdominal or pelvic lymph nodes. Reproductive: Prostate is unremarkable. Other: No free air or free fluid. Musculoskeletal: Degenerative changes of the spine. No acute or suspicious bone lesion. IMPRESSION: 1. There are borderline enlarged fluid-filled loops of small bowel in the lower abdomen and upper pelvis with decompressed distal small bowel loops. Findings could be secondary to ileus although developing bowel obstruction is also a consideration. 2. There are small bilateral pleural effusions. Cardiomegaly with hazy atelectasis or edema at the bases 3. Mild to moderate intra and extrahepatic biliary dilatation, nonvisualized gallbladder which is presumed to be surgically absent. Findings could be related to post cholecystectomy change however suggest correlation with laboratory values. This is progressed since previous CT from 2012. 4. Atrophic right kidney with cortical scarring in the lower pole of the left kidney.  No hydronephrosis. Electronically Signed   By: Donavan Foil M.D.   On: 07/24/2016 22:15   Ct Renal Stone Study  Result Date: 07/27/2016 CLINICAL DATA:  LEFT flank pain and groin for 2 weeks, history type II diabetes mellitus, hypertension, asthma, CHF, COPD, stage III chronic kidney disease, pulmonary hypertension, chronic atrial fibrillation EXAM: CT ABDOMEN AND PELVIS WITHOUT CONTRAST TECHNIQUE: Multidetector CT imaging of the abdomen and pelvis was performed following the standard protocol without IV contrast. Sagittal and coronal MPR images reconstructed  from axial data set. Oral contrast not administered for this indication. COMPARISON:  07/24/2016 FINDINGS: Lower chest: Bibasilar pleural effusions and compressive atelectasis of the lower lobes. Hepatobiliary: Gallbladder surgically absent. Liver unremarkable. Mild chronic extrahepatic biliary dilatation CBD 11 mm previously 10 mm Pancreas: Normal appearance Spleen: Normal appearance Adrenals/Urinary Tract: Adrenal glands normal appearance. Atrophic RIGHT kidney. LEFT kidney normal appearance without mass or hydronephrosis. Ureters and bladder unremarkable. Stomach/Bowel: Appendix not visualized. Stomach and bowel loops unremarkable for technique. Vascular/Lymphatic: Atherosclerotic calcifications aorta, iliac arteries, and abdominal branch vessels. Reproductive: Uterus surgically absent. Nonvisualization of ovaries. Other: No free air, free fluid, hernia, or definite inflammatory process. Musculoskeletal: Scattered degenerative disc and facet disease changes lumbar spine. Schmorl's nodes at superior endplates of L3 and L4. Diffuse osseous demineralization. IMPRESSION: Mild chronic extrahepatic biliary dilatation post cholecystectomy recommend correlation with LFTs. RIGHT renal atrophy. No acute intra-abdominal or intrapelvic abnormalities. Small BILATERAL pleural effusions with bibasilar atelectasis. Aortic Atherosclerosis (ICD10-I70.0). Electronically Signed   By: Lavonia Dana M.D.   On: 07/27/2016 13:47    Microbiology: Recent Results (from the past 240 hour(s))  Culture, Urine     Status: Abnormal   Collection Time: 07/24/16  5:08 PM  Result Value Ref Range Status   Specimen Description URINE, RANDOM  Final   Special Requests NONE  Final   Culture MULTIPLE SPECIES PRESENT, SUGGEST RECOLLECTION (A)  Final   Report Status 07/26/2016 FINAL  Final  MRSA PCR Screening     Status: None   Collection Time: 07/25/16  3:26 AM  Result Value Ref Range Status   MRSA by PCR NEGATIVE NEGATIVE Final    Comment:         The GeneXpert MRSA Assay (FDA approved for NASAL specimens only), is one component of a comprehensive MRSA colonization surveillance program. It is not intended to diagnose MRSA infection nor to guide or monitor treatment for MRSA infections.   Culture, blood (Routine X 2) w Reflex to ID Panel     Status: None (Preliminary result)   Collection Time: 07/25/16  2:36 PM  Result Value Ref Range Status   Specimen Description BLOOD RIGHT ANTECUBITAL  Final   Special Requests   Final    BOTTLES DRAWN AEROBIC AND ANAEROBIC Blood Culture adequate volume   Culture NO GROWTH 2 DAYS  Final   Report Status PENDING  Incomplete  Culture, blood (Routine X 2) w Reflex to ID Panel     Status: None (Preliminary result)   Collection Time: 07/25/16  2:36 PM  Result Value Ref Range Status   Specimen Description BLOOD RIGHT HAND  Final   Special Requests   Final    BOTTLES DRAWN AEROBIC AND ANAEROBIC Blood Culture adequate volume   Culture NO GROWTH 2 DAYS  Final   Report Status PENDING  Incomplete     Labs: Basic Metabolic Panel:  Recent Labs Lab 07/24/16 1633 07/25/16 0044 07/26/16 0411  NA 133* 135 138  K 4.3 4.6 4.3  CL 99*  102 108  CO2 23 22 22   GLUCOSE 159* 97 77  BUN 20 20 23*  CREATININE 0.90 0.94 1.07*  CALCIUM 8.7* 8.6* 8.4*   Liver Function Tests:  Recent Labs Lab 07/24/16 1633 07/25/16 0044  AST 28 26  ALT 18 19  ALKPHOS 80 79  BILITOT 0.6 0.6  PROT 6.6 6.4*  ALBUMIN 3.4* 3.3*    Recent Labs Lab 07/24/16 1633  LIPASE 51   No results for input(s): AMMONIA in the last 168 hours. CBC:  Recent Labs Lab 07/25/16 0044 07/25/16 0434 07/26/16 0411 07/27/16 0458 07/28/16 0433  WBC 6.5 7.0 5.3 4.4 5.8  HGB 10.0* 9.6* 9.6* 9.1* 8.9*  HCT 30.7* 29.4* 29.5* 27.6* 28.3*  MCV 85.8 84.7 85.8 85.2 85.5  PLT 190 168 153 161 168   Cardiac Enzymes: No results for input(s): CKTOTAL, CKMB, CKMBINDEX, TROPONINI in the last 168 hours. BNP: BNP (last 3  results)  Recent Labs  03/05/16 0800 03/18/16 1132  BNP 842.0* 765.5*    ProBNP (last 3 results) No results for input(s): PROBNP in the last 8760 hours.  CBG:  Recent Labs Lab 07/27/16 0753 07/27/16 1153 07/27/16 1700 07/27/16 2148 07/28/16 0824  GLUCAP 158* 148* 133* 113* 106*       Signed:  Eleonore Chiquito S MD.  [facility] 07/28/2016, 11:39 AM

## 2016-07-28 NOTE — Progress Notes (Signed)
Gave report to Suanne Marker, Therapist, sports at [facility] SN[facility]. Patient to transport there by ambulance.

## 2016-07-28 NOTE — Progress Notes (Addendum)
Patient able to return to [facility] SNF today. Patient does not yet have [place] [facility] authorization for physical therapy however patient can return to facility using Medicaid benefits in anticipation of [facility] authorization physical therapy early in the week. CSW updated patient and sister, Dianna. Dianna is currently in route to complete needed paperwork to return to [facility]. CSW will complete transport form and will need scripts for any new controlled substances. CSW will call [facility] once sister signs needed information.   Room Number: 569 Number for report: [place], [facility] Emergency Room Clinical Social Worker [phone]

## 2016-07-28 NOTE — Progress Notes (Addendum)
ANTICOAGULATION CONSULT NOTE - Follow Up Consult  Pharmacy Consult:  Coumadin Indication: atrial fibrillation  Allergies  Allergen Reactions  . Codeine Itching and Nausea And Vomiting  . Ativan [Lorazepam] Other (See Comments)    "Makes me confused and crazy"  . Ambien [Zolpidem] Other (See Comments)    "Has the opposite effect; makes me more alert/hyper"    Patient Measurements: Height: 5\' 5"  (165.1 cm) Weight: 140 lb (63.5 kg) IBW/kg (Calculated) : 57  Vital Signs: Temp: 98.3 F (36.8 C) (07/15 0513) BP: 122/53 (07/15 0513) Pulse Rate: 67 (07/15 0917)  Labs:  Recent Labs  07/26/16 0411 07/27/16 0458 07/28/16 0433  HGB 9.6* 9.1* 8.9*  HCT 29.5* 27.6* 28.3*  PLT 153 161 168  LABPROT 28.0* 30.8* 41.1*  INR 2.56 2.88 4.13*  CREATININE 1.07*  --   --     Estimated Creatinine Clearance: 39.6 mL/min (A) (by C-G formula based on SCr of 1.07 mg/dL (H)).    Assessment: 50 YOF continues on Coumadin for history of AFib.  INR increased to supra-therapeutic level today despite a small dose reduction yesterday.  Patient is now eating 50-100% of meals.  No new, interacting med.  No bleeding reported.  Home Coumadin dose:  6mg  PO daily   Goal of Therapy:  INR 2-3    Plan:  No Coumadin today Daily INR Monitor for s/sx of bleeding (also receiving PRN Toradol) F/U resume home meds   Cindra Austad D. Mina Marble, PharmD, BCPS Pager:  [phone] 07/28/2016, 10:54 AM

## 2016-07-30 LAB — CULTURE, BLOOD (ROUTINE X 2)
Culture: NO GROWTH
Culture: NO GROWTH
Special Requests: ADEQUATE
Special Requests: ADEQUATE

## 2016-08-15 ENCOUNTER — Encounter (INDEPENDENT_AMBULATORY_CARE_PROVIDER_SITE_OTHER): Payer: Self-pay | Admitting: Orthopaedic Surgery

## 2016-08-15 ENCOUNTER — Ambulatory Visit (INDEPENDENT_AMBULATORY_CARE_PROVIDER_SITE_OTHER): Payer: Medicare HMO | Admitting: Orthopaedic Surgery

## 2016-08-15 DIAGNOSIS — G8929 Other chronic pain: Secondary | ICD-10-CM | POA: Diagnosis not present

## 2016-08-15 DIAGNOSIS — M545 Low back pain: Secondary | ICD-10-CM | POA: Diagnosis not present

## 2016-08-15 NOTE — Progress Notes (Signed)
Office Visit Note   Patient: Sarah Moss           Date of Birth: [birth year]           MRN: [id number] Visit Date: 08/15/2016              Requested by: Wenda Low, MD [address] [place], [place] [address] PCP: Wenda Low, MD   Assessment & Plan: Visit Diagnoses:  1. Chronic bilateral low back pain, with sciatica presence unspecified     Plan: Overall impression is lumbar radiculopathy. CT myelogram of the lumbar spine to evaluate for stenosis and structural abnormalities. We will refer the patient to Dr. Ernestina Patches for epidural steroid injection once the results are available. I've also given them a prescription for prosthetic fitting for her right leg. She has recently lost a lot of weight and no longer fits well and is causing her a lot of discomfort and gait abnormality.  Follow-Up Instructions: Return if symptoms worsen or fail to improve.   Orders:  Orders Placed This Encounter  Procedures  . CT LUMBAR SPINE WO CONTRAST   No orders of the defined types were placed in this encounter.     Procedures: No procedures performed   Clinical Data: No additional findings.   Subjective: Chief Complaint  Patient presents with  . Lower Back - Pain    Patient is 76 year old female comes in with severe lumbar radiculopathy for several weeks. She lives at a nursing facility permanently. She has dementia and is status post right below-the-knee amputation since she was 76 years old. She is here today with her daughter and sister. She has tried previous cortisone injections and she is currently taking Percocet and anti-inflammatories. She cannot have an MRI due to pacemaker.    Review of Systems  Constitutional: Negative.   HENT: Negative.   Eyes: Negative.   Respiratory: Negative.   Cardiovascular: Negative.   Endocrine: Negative.   Musculoskeletal: Negative.   Neurological: Negative.   Hematological: Negative.   Psychiatric/Behavioral:  Negative.   All other systems reviewed and are negative.    Objective: Vital Signs: LMP  (LMP Unknown)   Physical Exam  Constitutional: She is oriented to person, place, and time. She appears well-developed and well-nourished.  HENT:  Head: Normocephalic and atraumatic.  Eyes: EOM are normal.  Neck: Neck supple.  Pulmonary/Chest: Effort normal.  Abdominal: Soft.  Neurological: She is alert and oriented to person, place, and time.  Skin: Skin is warm. Capillary refill takes less than 2 seconds.  Psychiatric: She has a normal mood and affect. Her behavior is normal. Judgment and thought content normal.  Nursing note and vitals reviewed.   Ortho Exam Left lower extremity exam is benign for the hip. No sciatic tension signs. No focal motor sensory deficits. Specialty Comments:  No specialty comments available.  Imaging: No results found.   PMFS History: Patient Active Problem List   Diagnosis Date Noted  . Ileus ([place]) 07/24/2016  . Permanent atrial fibrillation ([place]) 05/10/2016  . Status post below knee amputation of right lower extremity ([place]) 05/10/2016  . Longstanding persistent atrial fibrillation ([place]) 04/02/2016  . CHB (complete heart block) (HCC) s/p AV node ablation 04/02/2016  . Chronic anticoagulation 03/22/2016  . Microcytic anemia 03/22/2016  . Diabetes mellitus with hemoglobin A1c goal of 7.0%-8.0% ([place])   . Cardiomyopathy ([place]) 03/21/2016  . PAD (peripheral artery disease) ([place])   . Chronic atrial fibrillation ([place]) 03/18/2016  . Amputee, below knee (  [place]) 03/18/2016  . Congestive heart failure ([place]) 03/18/2016  . Pulmonary emphysema ([place]) 03/18/2016  . GERD (gastroesophageal reflux disease) 03/18/2016  . Acute systolic heart failure ([place]) 03/18/2016  . Bilateral pleural effusion 02/24/2015  . S/P AV nodal ablation 02/21/2015  . Cardiac pacemaker in situ 02/20/2015  . Urticaria 02/20/2015  . Type 2 diabetes mellitus with complication ([place]) 36/46/8032  .  Tachy-brady syndrome ([place]) 12/19/2014  . Normal left ventricular systolic function and wall motion 10/12/2014  . Benign essential hypertension 09/14/2014  . Dyspnea 09/14/2014  . Long-term use of aspirin therapy 09/14/2014  . Allergic rhinitis 08/03/2014  . DOE (dyspnea on exertion) 08/03/2014  . Pulmonary HTN ([place]) 08/03/2014  . Sleep apnea 08/03/2014  . Sleep apnea with use of continuous positive airway pressure (CPAP) 08/03/2014  . Chronic diastolic CHF (congestive heart failure) ([place]) 05/30/2014  . HTN (hypertension) 11/04/2012  . Hypothyroidism 11/04/2012  . Type 2 diabetes mellitus with diabetic polyneuropathy ([place]) 11/04/2012  . Red blood cell antibody positive 10/28/2011  . Arthropathy 08/17/2009  . Mixed incontinence 08/17/2009  . Benign renovascular hypertension 08/17/2009  . Anxiety state 06/05/2009  . Abnormal finding on thyroid function test 06/01/2009  . Vitamin D deficiency 06/01/2009  . Type II or unspecified type diabetes mellitus without mention of complication, not stated as uncontrolled 05/26/2009  . Asthma 05/24/2009  . Chronic kidney disease, stage III (moderate) 05/24/2009  . Depressive disorder, not elsewhere classified 05/24/2009  . Insomnia 05/24/2009  . Osteoarthritis 05/24/2009  . Osteoporosis 05/24/2009  . Peripheral vascular disease ([place]) 05/24/2009   Past Medical History:  Diagnosis Date  . Asthma   . CHF (congestive heart failure) ([place])   . COPD (chronic obstructive pulmonary disease) ([place])   . Diabetes mellitus without complication ([place])   . GERD (gastroesophageal reflux disease)   . Hx of right BKA ([place])   . Hypertension     Family History  Problem Relation Age of Onset  . Stroke Mother   . Diabetes Mother   . Cancer Father     Past Surgical History:  Procedure Laterality Date  . LOWER EXTREMITY ANGIOGRAPHY N/A 03/19/2016   Procedure: Lower Extremity Angiography;  Surgeon: Serafina Mitchell, MD;  Location: [facility];  Service:  Cardiovascular;  Laterality: N/A;  . PERIPHERAL VASCULAR INTERVENTION Bilateral 03/19/2016   Procedure: Peripheral Vascular Intervention;  Surgeon: Serafina Mitchell, MD;  Location: [facility];  Service: Cardiovascular;  Laterality: Bilateral;  iliacs   Social History   Occupational History  . Not on file.   Social History Main Topics  . Smoking status: Former Research scientist (life sciences)  . Smokeless tobacco: Never Used  . Alcohol use No  . Drug use: No  . Sexual activity: Not on file

## 2016-08-19 ENCOUNTER — Encounter (HOSPITAL_COMMUNITY): Payer: Self-pay | Admitting: Emergency Medicine

## 2016-08-19 ENCOUNTER — Emergency Department (HOSPITAL_COMMUNITY)
Admission: EM | Admit: 2016-08-19 | Discharge: 2016-08-20 | Disposition: A | Discharge status: Home / Self Care | Payer: Medicare HMO | Attending: Emergency Medicine | Admitting: Emergency Medicine

## 2016-08-19 ENCOUNTER — Other Ambulatory Visit: Payer: Self-pay

## 2016-08-19 ENCOUNTER — Emergency Department (HOSPITAL_COMMUNITY): Payer: Medicare HMO

## 2016-08-19 DIAGNOSIS — I129 Hypertensive chronic kidney disease with stage 1 through stage 4 chronic kidney disease, or unspecified chronic kidney disease: Secondary | ICD-10-CM | POA: Diagnosis not present

## 2016-08-19 DIAGNOSIS — E119 Type 2 diabetes mellitus without complications: Secondary | ICD-10-CM | POA: Diagnosis not present

## 2016-08-19 DIAGNOSIS — R072 Precordial pain: Secondary | ICD-10-CM | POA: Diagnosis not present

## 2016-08-19 DIAGNOSIS — J449 Chronic obstructive pulmonary disease, unspecified: Secondary | ICD-10-CM | POA: Insufficient documentation

## 2016-08-19 DIAGNOSIS — I509 Heart failure, unspecified: Secondary | ICD-10-CM | POA: Diagnosis not present

## 2016-08-19 DIAGNOSIS — N183 Chronic kidney disease, stage 3 (moderate): Secondary | ICD-10-CM | POA: Insufficient documentation

## 2016-08-19 DIAGNOSIS — R1013 Epigastric pain: Secondary | ICD-10-CM | POA: Diagnosis present

## 2016-08-19 DIAGNOSIS — I110 Hypertensive heart disease with heart failure: Secondary | ICD-10-CM | POA: Insufficient documentation

## 2016-08-19 LAB — BASIC METABOLIC PANEL
ANION GAP: 12 (ref 5–15)
BUN: 19 mg/dL (ref 6–20)
CALCIUM: 9.1 mg/dL (ref 8.9–10.3)
CO2: 23 mmol/L (ref 22–32)
Chloride: 99 mmol/L — ABNORMAL LOW (ref 101–111)
Creatinine, Ser: 0.95 mg/dL (ref 0.44–1.00)
GFR calc Af Amer: >60 mL/min (ref >60)
GFR, EST NON AFRICAN AMERICAN: 56 mL/min — AB (ref >60)
GLUCOSE: 89 mg/dL (ref 65–99)
Potassium: 4.1 mmol/L (ref 3.5–5.1)
SODIUM: 134 mmol/L — AB (ref 135–145)

## 2016-08-19 LAB — CBC
HCT: 31.5 % — ABNORMAL LOW (ref 36.0–46.0)
Hemoglobin: 10.3 g/dL — ABNORMAL LOW (ref 12.0–15.0)
MCH: 28.2 pg (ref 26.0–34.0)
MCHC: 32.7 g/dL (ref 30.0–36.0)
MCV: 86.3 fL (ref 78.0–100.0)
Platelets: 242 10*3/uL (ref 150–400)
RBC: 3.65 MIL/uL — ABNORMAL LOW (ref 3.87–5.11)
RDW: 17.2 % — AB (ref 11.5–15.5)
WBC: 7.1 10*3/uL (ref 4.0–10.5)

## 2016-08-19 LAB — I-STAT TROPONIN, ED
TROPONIN I, POC: 0 ng/mL (ref 0.00–0.08)
TROPONIN I, POC: 0.01 ng/mL (ref 0.00–0.08)

## 2016-08-19 NOTE — ED Provider Notes (Signed)
A [facility] of a thrill in  [facility] DEPT Provider Note   CSN: [id number] Arrival date & time: 08/19/16  2029     History   Chief Complaint Chief Complaint  Patient presents with  . Chest Pain    HPI Sarah Moss is a 76 y.o. female.  Patient currently at [facility]. Patient was complaining of epigastric and chest pain that radiates to the back.  Pain was reportedly 8 out of 10. Patient was given nitroglycerin 3 at the facility. Pain came down to 4 out of 10. Pain was described as sharp. There was no shortness of breath or diaphoresis no nausea or vomiting. Patient is on Coumadin for atrial fib. Patient has a ventricular paced pacemaker. Patient was brought in by EMS. Upon me seeing her patient denied any complaints at all.      Past Medical History:  Diagnosis Date  . Asthma   . CHF (congestive heart failure) ([place])   . COPD (chronic obstructive pulmonary disease) ([place])   . Diabetes mellitus without complication ([place])   . GERD (gastroesophageal reflux disease)   . Hx of right BKA ([place])   . Hypertension     Patient Active Problem List   Diagnosis Date Noted  . Ileus ([place]) 07/24/2016  . Permanent atrial fibrillation ([place]) 05/10/2016  . Status post below knee amputation of right lower extremity ([place]) 05/10/2016  . Longstanding persistent atrial fibrillation ([place]) 04/02/2016  . CHB (complete heart block) (HCC) s/p AV node ablation 04/02/2016  . Chronic anticoagulation 03/22/2016  . Microcytic anemia 03/22/2016  . Diabetes mellitus with hemoglobin A1c goal of 7.0%-8.0% ([place])   . Cardiomyopathy ([place]) 03/21/2016  . PAD (peripheral artery disease) ([place])   . Chronic atrial fibrillation ([place]) 03/18/2016  . Amputee, below knee ([place]) 03/18/2016  . Congestive heart failure ([place]) 03/18/2016  . Pulmonary emphysema ([place]) 03/18/2016  . GERD (gastroesophageal reflux disease) 03/18/2016  . Acute systolic heart failure ([place]) 03/18/2016  . Bilateral  pleural effusion 02/24/2015  . S/P AV nodal ablation 02/21/2015  . Cardiac pacemaker in situ 02/20/2015  . Urticaria 02/20/2015  . Type 2 diabetes mellitus with complication ([place]) 71/69/6789  . Tachy-brady syndrome ([place]) 12/19/2014  . Normal left ventricular systolic function and wall motion 10/12/2014  . Benign essential hypertension 09/14/2014  . Dyspnea 09/14/2014  . Long-term use of aspirin therapy 09/14/2014  . Allergic rhinitis 08/03/2014  . DOE (dyspnea on exertion) 08/03/2014  . Pulmonary HTN ([place]) 08/03/2014  . Sleep apnea 08/03/2014  . Sleep apnea with use of continuous positive airway pressure (CPAP) 08/03/2014  . Chronic diastolic CHF (congestive heart failure) ([place]) 05/30/2014  . HTN (hypertension) 11/04/2012  . Hypothyroidism 11/04/2012  . Type 2 diabetes mellitus with diabetic polyneuropathy ([place]) 11/04/2012  . Red blood cell antibody positive 10/28/2011  . Arthropathy 08/17/2009  . Mixed incontinence 08/17/2009  . Benign renovascular hypertension 08/17/2009  . Anxiety state 06/05/2009  . Abnormal finding on thyroid function test 06/01/2009  . Vitamin D deficiency 06/01/2009  . Type II or unspecified type diabetes mellitus without mention of complication, not stated as uncontrolled 05/26/2009  . Asthma 05/24/2009  . Chronic kidney disease, stage III (moderate) 05/24/2009  . Depressive disorder, not elsewhere classified 05/24/2009  . Insomnia 05/24/2009  . Osteoarthritis 05/24/2009  . Osteoporosis 05/24/2009  . Peripheral vascular disease ([place]) 05/24/2009    Past Surgical History:  Procedure Laterality Date  . LOWER EXTREMITY ANGIOGRAPHY N/A 03/19/2016   Procedure: Lower Extremity Angiography;  Surgeon: Durene Fruits  Pierre Bali, MD;  Location: [facility];  Service: Cardiovascular;  Laterality: N/A;  . PERIPHERAL VASCULAR INTERVENTION Bilateral 03/19/2016   Procedure: Peripheral Vascular Intervention;  Surgeon: Serafina Mitchell, MD;  Location: [facility];   Service: Cardiovascular;  Laterality: Bilateral;  iliacs    OB History    No data available       Home Medications    Prior to Admission medications   Medication Sig Start Date End Date Taking? Authorizing Provider  ADVAIR DISKUS 250-50 MCG/DOSE AEPB Inhale 1 puff into the lungs 2 (two) times daily. 03/08/16  Yes [provider]  ALPRAZolam (XANAX) 0.25 MG tablet Take 1 tablet (0.25 mg total) by mouth every 12 (twelve) hours as needed for anxiety. 07/28/16  Yes Oswald Hillock, MD  aspirin EC 81 MG EC tablet Take 1 tablet (81 mg total) by mouth daily. 03/24/16  Yes Eugenie Filler, MD  atorvastatin (LIPITOR) 40 MG tablet Take 40 mg by mouth daily. 05/30/14  Yes [provider]  carvedilol (COREG) 12.5 MG tablet Take 1 tablet (12.5 mg total) by mouth 2 (two) times daily with a meal. 05/27/16  Yes Isaiah Serge, NP  DULoxetine (CYMBALTA) 60 MG capsule Take 60 mg by mouth daily.   Yes [provider]  furosemide (LASIX) 40 MG tablet Take 40 mg by mouth daily.   Yes [provider]  gabapentin (NEURONTIN) 300 MG capsule Take 300 mg by mouth at bedtime. 05/30/14  Yes [provider]  iron polysaccharides (NIFEREX) 150 MG capsule Take 1 capsule (150 mg total) by mouth daily. 03/24/16  Yes Eugenie Filler, MD  levothyroxine (SYNTHROID, LEVOTHROID) 25 MCG tablet Take 25 mcg by mouth daily. 05/30/14  Yes [provider]  lidocaine (LIDODERM) 5 % Place 1 patch onto the skin every 12 (twelve) hours. Remove & Discard patch within 12 hours or as directed by MD   Yes [provider]  lisinopril (PRINIVIL,ZESTRIL) 20 MG tablet Take 20 mg by mouth daily.   Yes [provider]  metFORMIN (GLUCOPHAGE) 1000 MG tablet Take 1,000 mg by mouth 2 (two) times daily. 05/30/14  Yes [provider]  montelukast (SINGULAIR) 10 MG tablet Take 10 mg by mouth at bedtime.   Yes [provider]  nitroGLYCERIN (NITROSTAT) 0.4 MG SL tablet  Place 0.4 mg under the tongue every 5 (five) minutes as needed for chest pain.   Yes [provider]  omeprazole (PRILOSEC) 40 MG capsule Take 40 mg by mouth daily.   Yes [provider]  oxyCODONE-acetaminophen (PERCOCET/ROXICET) 5-325 MG tablet Take 1 tablet by mouth every 6 (six) hours as needed for severe pain. 07/28/16  Yes Darrick Meigs, Marge Duncans, MD  polyethylene glycol (MIRALAX / GLYCOLAX) packet Take 17 g by mouth 2 (two) times daily. 03/23/16  Yes Eugenie Filler, MD  senna-docusate (SENOKOT-S) 8.6-50 MG tablet Take 1 tablet by mouth 2 (two) times daily. 03/23/16  Yes Eugenie Filler, MD  VENTOLIN HFA 108 [phone] Base) MCG/ACT inhaler Inhale 1 puff into the lungs every 6 (six) hours as needed for shortness of breath.  03/08/16  Yes [provider]  Vitamin D, Ergocalciferol, (DRISDOL) 50000 units CAPS capsule Take 50,000 Units by mouth 2 (two) times a week.   Yes [provider]  warfarin (COUMADIN) 6 MG tablet Take 4 mg by mouth daily.    Yes [provider]    Family History Family History  Problem Relation Age of  Onset  . Stroke Mother   . Diabetes Mother   . Cancer Father     Social History Social History  Substance Use Topics  . Smoking status: Former Research scientist (life sciences)  . Smokeless tobacco: Never Used  . Alcohol use No     Allergies   Codeine; Ativan [lorazepam]; and Ambien [zolpidem]   Review of Systems Review of Systems  Unable to perform ROS: Dementia     Physical Exam Updated Vital Signs BP (!) 132/46   Pulse 74   Resp 15   LMP  (LMP Unknown)   SpO2 98%   Physical Exam  Constitutional: She appears well-developed and well-nourished. No distress.  HENT:  Head: Normocephalic and atraumatic.  Mouth/Throat: Oropharynx is clear and moist.  Eyes: Pupils are equal, round, and reactive to light. Conjunctivae and EOM are normal.  Neck: Normal range of motion. Neck supple.  Cardiovascular: Normal rate, regular rhythm and intact distal  pulses.   Pulmonary/Chest: Effort normal and breath sounds normal. No respiratory distress.  Abdominal: Soft. Bowel sounds are normal. There is no tenderness.  Neurological: She is alert. No cranial nerve deficit or sensory deficit. She exhibits normal muscle tone. Coordination normal.  Skin: Skin is warm.  Nursing note and vitals reviewed.    ED Treatments / Results  Labs (all labs ordered are listed, but only abnormal results are displayed) Labs Reviewed  BASIC METABOLIC PANEL - Abnormal; Notable for the following:       Result Value   Sodium 134 (*)    Chloride 99 (*)    GFR calc non Af Amer 56 (*)    All other components within normal limits  CBC - Abnormal; Notable for the following:    RBC 3.65 (*)    Hemoglobin 10.3 (*)    HCT 31.5 (*)    RDW 17.2 (*)    All other components within normal limits  I-STAT TROPONIN, ED  I-STAT TROPONIN, ED    EKG  EKG Interpretation None       Radiology Dg Chest 2 View  Result Date: 08/19/2016 CLINICAL DATA:  Acute onset of upper abdominal pain, radiating to the back. Initial encounter. EXAM: CHEST  2 VIEW COMPARISON:  Chest radiograph performed 07/25/2016 FINDINGS: The lungs are well-aerated. Peribronchial thickening is noted. There is no evidence of focal opacification, pleural effusion or pneumothorax. The heart is borderline enlarged. A pacemaker is noted at the left chest wall, with leads ending at the right atrium and right ventricle. No acute osseous abnormalities are seen. IMPRESSION: Peribronchial thickening noted. Lungs otherwise clear. Borderline cardiomegaly. Electronically Signed   By: Garald Balding M.D.   On: 08/19/2016 21:18    Procedures Procedures (including critical care time)  Medications Ordered in ED Medications - No data to display   Initial Impression / Assessment and Plan / ED Course  I have reviewed the triage vital signs and the nursing notes.  Pertinent labs & imaging results that were available  during my care of the patient were reviewed by me and considered in my medical decision making (see chart for details).     Patient is alert and will follow commands. With historically seem to be a little bit confused. No recollection of the complaints that seem to bring her here. Was 9 any complaint at all. Workup here for the chest pain and/or abdominal pain. The abdomen was soft and nontender EKG showed ventricular paced. Troponins 2 were negative. Electrolytes without significant abnormality. Mild anemia hemoglobin was 10.3. Patient  nontoxic no acute distress.  Final Clinical Impressions(s) / ED Diagnoses   Final diagnoses:  Precordial pain    New Prescriptions New Prescriptions   No medications on file     Fredia Sorrow, MD 08/19/16 2358

## 2016-08-19 NOTE — Discharge Instructions (Signed)
Patient while being seen by me denied any chest pain or any other complaints. Workup here without any acute findings. Patient had cardiac markers 2 that were negative. Chest x-ray negative. Patient stable for discharge back to nursing facility.

## 2016-08-19 NOTE — ED Triage Notes (Signed)
Per EMS, patient coming from [facility] complaining of upper gastric pain that radiates to back.  Pain 8/10.  Given 3 nitro at facility. Pain came down to 4/10.  Describes pain as sharp.  Denies N/V/SOB/Diaphoresis.  Patient on coumadin for Afib.  Has ventricular paced pacemaker.  A/O x 3. Baseline.  142/78, 77 HR, 98% RA, RR 16, CBG 236, 20G LH.

## 2016-08-20 NOTE — ED Notes (Signed)
[facility] notified for transport 

## 2016-08-23 ENCOUNTER — Other Ambulatory Visit (INDEPENDENT_AMBULATORY_CARE_PROVIDER_SITE_OTHER): Payer: Self-pay | Admitting: Orthopaedic Surgery

## 2016-08-23 DIAGNOSIS — G8929 Other chronic pain: Secondary | ICD-10-CM

## 2016-08-23 DIAGNOSIS — M545 Low back pain: Principal | ICD-10-CM

## 2016-09-04 ENCOUNTER — Other Ambulatory Visit (INDEPENDENT_AMBULATORY_CARE_PROVIDER_SITE_OTHER): Payer: Self-pay | Admitting: Orthopaedic Surgery

## 2016-09-04 ENCOUNTER — Ambulatory Visit
Admission: RE | Admit: 2016-09-04 | Discharge: 2016-09-04 | Disposition: A | Discharge status: Home / Self Care | Payer: Medicare HMO | Source: Ambulatory Visit | Attending: Orthopaedic Surgery | Admitting: Orthopaedic Surgery

## 2016-09-04 DIAGNOSIS — M545 Low back pain, unspecified: Secondary | ICD-10-CM

## 2016-09-04 DIAGNOSIS — G8929 Other chronic pain: Secondary | ICD-10-CM

## 2016-09-04 MED ORDER — IOPAMIDOL (ISOVUE-M 200) INJECTION 41%
15.0000 mL | Freq: Once | INTRAMUSCULAR | Status: AC
Start: 2016-09-04 — End: 2016-09-04
  Administered 2016-09-04: 15 mL via INTRATHECAL

## 2016-09-04 MED ORDER — DIAZEPAM 5 MG PO TABS
5.0000 mg | ORAL_TABLET | Freq: Once | ORAL | Status: AC
  Administered 2016-09-04: 5 mg via ORAL

## 2016-09-04 NOTE — Discharge Instructions (Signed)
Myelogram Discharge Instructions  1. Go home and rest quietly for the next 24 hours.  It is important to lie flat for the next 24 hours.  Get up only to go to the restroom.  You may lie in the bed or on a couch on your back, your stomach, your left side or your right side.  You may have one pillow under your head.  You may have pillows between your knees while you are on your side or under your knees while you are on your back.  2. DO NOT drive today.  Recline the seat as far back as it will go, while still wearing your seat belt, on the way home.  3. You may get up to go to the bathroom as needed.  You may sit up for 10 minutes to eat.  You may resume your normal diet and medications unless otherwise indicated.  Drink lots of extra fluids today and tomorrow.  4. The incidence of headache, nausea, or vomiting is about 5% (one in 20 patients).  If you develop a headache, lie flat and drink plenty of fluids until the headache goes away.  Caffeinated beverages may be helpful.  If you develop severe nausea and vomiting or a headache that does not go away with flat bed rest, call [phone].  5. You may resume normal activities after your 24 hours of bed rest is over; however, do not exert yourself strongly or do any heavy lifting tomorrow. If when you get up you have a headache when standing, go back to bed and force fluids for another 24 hours.  6. Call your physician for a follow-up appointment.  The results of your myelogram will be sent directly to your physician by the following day.  7. If you have any questions or if complications develop after you arrive home, please call [phone].  Discharge instructions have been explained to the patient.  The patient, or the person responsible for the patient, fully understands these instructions.      MAY RESUME COUMADIN TODAY.   May resume Cymbalta on Aug. 23, 2018, after 9:30 am.

## 2016-09-04 NOTE — Progress Notes (Signed)
Pt states she has been off Cymbalta and Coumadin. INR is 1.0.

## 2016-10-10 ENCOUNTER — Ambulatory Visit (INDEPENDENT_AMBULATORY_CARE_PROVIDER_SITE_OTHER): Payer: Medicare HMO | Admitting: *Deleted

## 2016-10-10 DIAGNOSIS — I495 Sick sinus syndrome: Secondary | ICD-10-CM | POA: Diagnosis not present

## 2016-10-10 NOTE — Progress Notes (Signed)
Remote pacemaker transmission.   

## 2016-10-14 ENCOUNTER — Telehealth: Payer: Self-pay | Admitting: Cardiology

## 2016-10-14 NOTE — Telephone Encounter (Signed)
Did not need this encounter °

## 2016-10-15 ENCOUNTER — Encounter: Payer: Self-pay | Admitting: Cardiology

## 2016-10-15 LAB — CUP PACEART REMOTE DEVICE CHECK
Battery Remaining Longevity: 116 mo
Battery Remaining Percentage: 95.5 %
Battery Voltage: 2.99 V
Brady Statistic RV Percent Paced: 99 %
Date Time Interrogation Session: 20180927060014
Implantable Lead Implant Date: 20161205
Implantable Lead Implant Date: 20161205
Implantable Lead Location: 753859
Implantable Lead Location: 753860
Implantable Pulse Generator Implant Date: 20161205
Lead Channel Impedance Value: 450 Ohm
Lead Channel Pacing Threshold Amplitude: 0.5 V
Lead Channel Pacing Threshold Pulse Width: 0.4 ms
Lead Channel Sensing Intrinsic Amplitude: 6.7 mV
Lead Channel Setting Pacing Amplitude: 2.5 V
Lead Channel Setting Pacing Pulse Width: 0.4 ms
Lead Channel Setting Sensing Sensitivity: 4 mV
Pulse Gen Model: 2240
Pulse Gen Serial Number: 7843315

## 2016-10-21 ENCOUNTER — Telehealth: Payer: Self-pay

## 2016-10-21 NOTE — Telephone Encounter (Signed)
[facility] to clarify procedure since it is unusual to hold Coumadin or require general anesthesia for a dermatological procedure. Per their nurse, pt will be having a wide local excision with 1 inch margins and is required to hold warfarin for 5 days prior.  Pt takes warfarin for afib with CHADS2VASc score of 7 (CHF, CAD, age, sex, HTN, DM). She will require a Lovenox bridge for her procedure. We do not manage patient's warfarin - please follow up with her PCP to arrange Lovenox bridge.

## 2016-10-21 NOTE — Telephone Encounter (Signed)
   [facility] Pre-operative Risk Assessment    Request for surgical clearance:  1. What type of surgery is being performed? Melanoma removal  2. When is this surgery scheduled? TBD  3. Are there any medications that need to be held prior to surgery and how long? Patient is currently on Warfarin and will most likely require Lovenox bridge management, we need instructions from you as to how patient should HOLD medication pre operatively. Please advise. Patient does not have surgery scheduled yet because she will require written clearance.  4. Practice name and name of physician performing surgery? [facility]. Dr. Barry Dienes.  5. What is your office phone and fax number? P: [phone]. F: [phone].  6. Anesthesia type (None, local, MAC, general) ? General   Hoover Browns S 10/21/2016, 3:29 PM  _________________________________________________________________   (provider comments below)

## 2016-10-22 NOTE — Telephone Encounter (Signed)
I spoke with nurse at [facility] and gave her information from Cecilie Kicks, NP.  She will check with Dr. Barry Dienes to see if OK to wait until 10/31 appoitment for risk assessment. I also let office know pt would need Lovenox bridge once cleared and this is followed by her PCP.

## 2016-10-22 NOTE — Telephone Encounter (Signed)
Per Dr. Marlou Porch pt is high risk for surgery.  I will see her 11/13/16 and can give risk assessment.  But if surgery needs to be done before the appt on the 31st she would be considered high risk.  Please notify [facility] that pt will need to be seen unless this is emergent surgery.

## 2016-11-12 NOTE — Progress Notes (Addendum)
Cardiology Office Note   Date:  11/13/2016   ID:  DEMRI POULTON, DOB 1940-05-12, MRN [id number]  PCP:  Wenda Low, MD  Cardiologist:  Dr. Marlou Porch    Chief Complaint  Patient presents with  . Pre-op Exam      History of Present Illness: Sarah Moss is a 76 y.o. female who presents for pre-op melanoma removal under general anesthesia - last surgery without clear borders.    Seen in ER in august for chest pain troponin was neg  Pain was upper GI with radiation to back.  Sharp pain. NTG may have helped some.  No EKG changes. No hx of MI or CAD    Pt with history significant of COPD, DM2, GERD, hx R BKA, HTN, chronic systolic diastolic HTN. On coumadin for a fib/  [name] of St.Jude pacemaker placement in December 2015 with right BKA from accident, peripheral arterial disease status post left superficial femoral artery stenting, diabetes with hypertension, atrial fibrillation with tachycardia-bradycardia syndrome on chronic anticoagulation with Coumadin was seen 05/10/16 by Dr. Marlou Porch for post hospital follow-up after being admitted with left ventricular dysfunction, shortness of breath, acute systolic heart failure. She had also been hospitalized in 12/2015 at [facility] for CHF. In 09/2015 echo with EF 50-55%, with mod MR and TR with mod. Pul. HTN. On last visit lasix increased to 80 mg BID but edema and wt have gone up.    She has had stent to LCIA and RCIA by Dr. Trula Slade. Echo was done with new EF 20%, Other hx of single kidney and stent placed in 2006 to renal artery. Now on BB, lasix increased, plan to titrate meds and repeat echo in 3 months.   She had problems with edema until we added metolzone and then she was able to walk around the room with her prosthesis.    Pt has been doing well.  No further chest pain or SOB.  She is in w/c most of time, though she does walk around room to do her ADLS.  She wears her prosthesis.  She has had no further edema.  She  does pull herself along on rails in hallway.      Past Medical History:  Diagnosis Date  . Asthma   . CHF (congestive heart failure) ([place])   . COPD (chronic obstructive pulmonary disease) ([place])   . Diabetes mellitus without complication ([place])   . GERD (gastroesophageal reflux disease)   . Hx of right BKA ([place])   . Hypertension     Past Surgical History:  Procedure Laterality Date  . LOWER EXTREMITY ANGIOGRAPHY N/A 03/19/2016   Procedure: Lower Extremity Angiography;  Surgeon: Serafina Mitchell, MD;  Location: [place] [facility];  Service: Cardiovascular;  Laterality: N/A;  . PERIPHERAL VASCULAR INTERVENTION Bilateral 03/19/2016   Procedure: Peripheral Vascular Intervention;  Surgeon: Serafina Mitchell, MD;  Location: [place] [facility];  Service: Cardiovascular;  Laterality: Bilateral;  iliacs     Current Outpatient Prescriptions  Medication Sig Dispense Refill  . ADVAIR DISKUS 250-50 MCG/DOSE AEPB Inhale 1 puff into the lungs 2 (two) times daily.    Marland Kitchen ALPRAZolam (XANAX) 0.25 MG tablet Take 1 tablet (0.25 mg total) by mouth every 12 (twelve) hours as needed for anxiety. 10 tablet 0  . aspirin EC 81 MG EC tablet Take 1 tablet (81 mg total) by mouth daily.    Marland Kitchen atorvastatin (LIPITOR) 40 MG tablet Take 40 mg by mouth daily.    Marland Kitchen  carvedilol (COREG) 12.5 MG tablet Take 1 tablet (12.5 mg total) by mouth 2 (two) times daily with a meal. 60 tablet 9  . DULoxetine (CYMBALTA) 60 MG capsule Take 60 mg by mouth daily.    . furosemide (LASIX) 40 MG tablet Take 40 mg by mouth daily.    Marland Kitchen gabapentin (NEURONTIN) 300 MG capsule Take 300 mg by mouth at bedtime.    . iron polysaccharides (NIFEREX) 150 MG capsule Take 1 capsule (150 mg total) by mouth daily.    Marland Kitchen levothyroxine (SYNTHROID, LEVOTHROID) 25 MCG tablet Take 25 mcg by mouth daily.    Marland Kitchen lidocaine (LIDODERM) 5 % Place 1 patch onto the skin every 12 (twelve) hours. Remove & Discard patch within 12 hours or as directed by MD    . lisinopril  (PRINIVIL,ZESTRIL) 20 MG tablet Take 20 mg by mouth daily.    . metFORMIN (GLUCOPHAGE) 1000 MG tablet Take 1,000 mg by mouth 2 (two) times daily.    . montelukast (SINGULAIR) 10 MG tablet Take 10 mg by mouth at bedtime.    . nitroGLYCERIN (NITROSTAT) 0.4 MG SL tablet Place 0.4 mg under the tongue every 5 (five) minutes as needed for chest pain.    Marland Kitchen omeprazole (PRILOSEC) 40 MG capsule Take 40 mg by mouth daily.    Marland Kitchen oxyCODONE-acetaminophen (PERCOCET/ROXICET) 5-325 MG tablet Take 1 tablet by mouth every 6 (six) hours as needed for severe pain. 10 tablet 0  . polyethylene glycol (MIRALAX / GLYCOLAX) packet Take 17 g by mouth 2 (two) times daily. 14 each 0  . senna-docusate (SENOKOT-S) 8.6-50 MG tablet Take 1 tablet by mouth 2 (two) times daily.    . VENTOLIN HFA 108 (90 Base) MCG/ACT inhaler Inhale 1 puff into the lungs every 6 (six) hours as needed for shortness of breath.     . Vitamin D, Ergocalciferol, (DRISDOL) 50000 units CAPS capsule Take 50,000 Units by mouth 2 (two) times a week.    . warfarin (COUMADIN) 6 MG tablet Take 4 mg by mouth daily.      No current facility-administered medications for this visit.     Allergies:   Codeine; Ativan [lorazepam]; and Ambien [zolpidem]    Social History:  The patient  reports that she has quit smoking. She has never used smokeless tobacco. She reports that she does not drink alcohol or use drugs.   Family History:  The patient's family history includes Cancer in her father; Diabetes in her mother; Stroke in her mother.    ROS:  General:no colds or fevers, no weight changes Skin:no rashes or ulcers HEENT:no blurred vision, no congestion CV:see HPI PUL:see HPI GI:no diarrhea constipation or melena, no indigestion GU:no hematuria, no dysuria MS:no joint pain, no claudication Neuro:no syncope, no lightheadedness Endo:no diabetes, no thyroid disease  Wt Readings from Last 3 Encounters:  11/13/16 142 lb 12.8 oz (64.8 kg)  07/25/16 140 lb  (63.5 kg)  07/11/16 133 lb 6 oz (60.5 kg)     PHYSICAL EXAM: VS:  BP 124/70   Pulse 72   Ht 5\' 5"  (1.651 m)   Wt 142 lb 12.8 oz (64.8 kg)   LMP  (LMP Unknown)   SpO2 97%   BMI 23.76 kg/m  , BMI Body mass index is 23.76 kg/m. General:Pleasant affect, NAD Skin:Warm and dry, brisk capillary refill HEENT:normocephalic, sclera clear, mucus membranes moist Neck:supple, no JVD, no bruits  Heart:S1S2 RRR without murmur, gallup, rub or click Lungs:clear without rales, rhonchi, or wheezes BHA:LPFX, non tender, +  BS, do not palpate liver spleen or masses Ext:no lower ext edema, Rt BKA, , 2+ radial pulses Neuro:alert and oriented X 3 today, MAE, follows commands, + facial symmetry    EKG:  EKG is ordered today. The ekg ordered today demonstrates V pacing    Recent Labs: 03/18/2016: B Natriuretic Peptide 765.5; TSH 4.251 03/21/2016: Magnesium 2.1 07/25/2016: ALT 19 08/19/2016: BUN 19; Creatinine, Ser 0.95; Hemoglobin 10.3; Platelets 242; Potassium 4.1; Sodium 134    Lipid Panel No results found for: CHOL, TRIG, HDL, CHOLHDL, VLDL, LDLCALC, LDLDIRECT     Other studies Reviewed: Additional studies/ records that were reviewed today include: . Echo 03/2016  Study Conclusions  - Left ventricle: The cavity size was normal. Wall thickness was   normal. Systolic function was severely reduced. The estimated   ejection fraction was in the range of 20% to 25%. Diffuse   hypokinesis. Doppler parameters are consistent with both elevated   ventricular end-diastolic filling pressure and elevated left   atrial filling pressure. - Aortic valve: Valve area (VTI): 0.63 cm^2. Valve area (Vmax):   0.89 cm^2. Valve area (Vmean): 0.75 cm^2. - Mitral valve: Calcified annulus. Mildly thickened leaflets .   There was mild regurgitation. - Right ventricle: The cavity size was moderately dilated. - Right atrium: The atrium was moderately dilated. - Atrial septum: No defect or patent foramen ovale was  identified. - Tricuspid valve: There was moderate-severe regurgitation. - Pulmonary arteries: PA peak pressure: 39 mm Hg (S). - Pericardium, extracardiac: There was a left pleural effusion.   ASSESSMENT AND PLAN:  1.  Pre-op exam for melanoma resection til clear borders.  Will need general anesthesia and hold coumadin with lovenox crossover.  No chest pain and euvolemic today, still at moderate risk though overall has improved.  Would recommend surgery at [facility] due to her health issues including COPD.  She has been stable for last several months. Follow up with Dr. Marlou Porch in jan/Feb and prn  2..cardiomyopathy  euvolemic today (associated with pacemaker implantation per Dr. Olin Pia note)  3. COPD no wheezes today  4. permanent A fib with V pacing. On coumadin will need lovenox coumadin crossover.   5.  PAD with iliac stenting  6.  Pacemaker St Jude  7.  Anemia last hgb 10.3    Current medicines are reviewed with the patient today.  The patient Has no concerns regarding medicines.  The following changes have been made:  See above Labs/ tests ordered today include:see above  Disposition:   FU:  see above     Chart reviewed as part of pre-operative protocol coverage. Given past medical history and time since last visit, based on ACC/AHA guidelines, Sarah Moss would be at acceptable moderate risk for the planned procedure without further cardiovascular testing.  It may be beneficial to have done at [facility] with her hx of cardiomyopathy.  She will need lovenox coumadin crossover, the nursing home has been following INR.      Cecilie Kicks, NP 11/13/2016, 1:27 PM     Signed, Cecilie Kicks, NP  11/13/2016 1:12 PM    [facility], [place], [place] Phone: [phone]; Fax: [phone]

## 2016-11-13 ENCOUNTER — Encounter: Payer: Self-pay | Admitting: Cardiology

## 2016-11-13 ENCOUNTER — Ambulatory Visit (INDEPENDENT_AMBULATORY_CARE_PROVIDER_SITE_OTHER): Payer: Medicare HMO | Admitting: Cardiology

## 2016-11-13 VITALS — BP 124/70 | HR 72 | Ht 65.0 in | Wt 142.8 lb

## 2016-11-13 DIAGNOSIS — D500 Iron deficiency anemia secondary to blood loss (chronic): Secondary | ICD-10-CM

## 2016-11-13 DIAGNOSIS — I495 Sick sinus syndrome: Secondary | ICD-10-CM | POA: Diagnosis not present

## 2016-11-13 DIAGNOSIS — Z950 Presence of cardiac pacemaker: Secondary | ICD-10-CM | POA: Diagnosis not present

## 2016-11-13 DIAGNOSIS — I420 Dilated cardiomyopathy: Secondary | ICD-10-CM | POA: Diagnosis not present

## 2016-11-13 DIAGNOSIS — I4821 Permanent atrial fibrillation: Secondary | ICD-10-CM

## 2016-11-13 DIAGNOSIS — I5022 Chronic systolic (congestive) heart failure: Secondary | ICD-10-CM

## 2016-11-13 DIAGNOSIS — I482 Chronic atrial fibrillation: Secondary | ICD-10-CM

## 2016-11-13 DIAGNOSIS — Z01818 Encounter for other preprocedural examination: Secondary | ICD-10-CM | POA: Diagnosis not present

## 2016-11-13 NOTE — Patient Instructions (Addendum)
Medication Instructions: Your physician recommends that you continue on your current medications as directed. Please refer to the Current Medication list given to you today.  Labwork: None Ordered  Procedures/Testing: None Ordered  Follow-Up: Your physician wants you to follow-up in February 2019 with Dr. Marlou Porch.   If you need a refill on your cardiac medications before your next appointment, please call your pharmacy.

## 2016-11-15 ENCOUNTER — Other Ambulatory Visit: Payer: Self-pay | Admitting: General Surgery

## 2016-11-29 ENCOUNTER — Other Ambulatory Visit: Payer: Self-pay | Admitting: General Surgery

## 2016-12-11 ENCOUNTER — Encounter (HOSPITAL_COMMUNITY): Payer: Self-pay | Admitting: Emergency Medicine

## 2016-12-11 ENCOUNTER — Other Ambulatory Visit: Payer: Self-pay

## 2016-12-11 NOTE — Progress Notes (Signed)
Anesthesia Chart Review:  Pt is a same day work up  Pt is a 76 year old female scheduled for wide local excision with advancement flap closure for chest wall melanoma on 12/12/2016 with Stark Klein, MD  - PCP is Benita Stabile, MD  - Cardiologist is Candee Furbish, MD. Pt cleared for surgery at moderate risk at last office visit 11/13/16 with Cecilie Kicks, MD  - EP cardiologist is Virl Axe, MD who monitor's pt's pacemaker. Last office visit 07/11/16. "She is not very active and given current paucity of symptoms, I am not in favor of CRT upgrade, notwithstanding age"   PMH includes:  CHF, cardiomyopathy (EF 20-25%, pacemaker-mediated), atrial fibrillation, tachycardia-bradycardia syndrome, pacemaker (St. Jude, implanted 2016), HTN, DM, PAD (s/p L SFA stent, RCIA and LCIA stents 03/19/16, R renal artery stent 2006), COPD, asthma, dementia. S/p R BKA. Former smoker.   - Hospitalized 7/11-15/18 for flank pain, delirium, fever, ileus  - Hospitalized 3/5-10/18 for acute on chronic systolic HF, new onset cardiomyopathy (thought to be pacemaker-induced)   Medications include: Advair, ASA 81 mg, Lipitor, carvedilol, Lovenox, Lasix, iron, levothyroxine, lisinopril, metformin, Prilosec, Coumadin. Stopped coumadin 12/07/16, now on lovenox bridge.   Labs will be obtained day of surgery  CXR 08/19/16:  - Peribronchial thickening noted. Lungs otherwise clear. Borderline cardiomegaly.  EKG 11/13/16: V-paced rhythm  Echo 03/20/16:  Left ventricle: The cavity size was normal. Wall thickness was normal. Systolic function was severely reduced. The estimated ejection fraction was in the range of 20% to 25%. Diffuse hypokinesis. Doppler parameters are consistent with both elevated ventricular end-diastolic filling pressure and elevated left atrial filling pressure. - Aortic valve: Valve area (VTI): 0.63 cm^2. Valve area (Vmax): 0.89 cm^2. Valve area (Vmean): 0.75 cm^2. - Mitral valve: Calcified annulus. Mildly  thickened leaflets. There was mild regurgitation. - Right ventricle: The cavity size was moderately dilated. - Right atrium: The atrium was moderately dilated. - Atrial septum: No defect or patent foramen ovale was identified. - Tricuspid valve: There was moderate-severe regurgitation. - Pulmonary arteries: PA peak pressure: 39 mm Hg (S). - Pericardium, extracardiac: There was a left pleural effusion.  Perioperative prescription form for pacemaker is pending.    If labs acceptable day of surgery, I anticipate pt can proceed as scheduled.   Willeen Cass, FNP-BC [facility] Stay [facility]/Anesthesiology Phone: [phone] 12/11/2016 12:16 PM

## 2016-12-11 NOTE — Pre-Procedure Instructions (Signed)
Sarah Moss  12/11/2016      [place], [place] [address] and [place] [address] Phone: [phone] Fax: [phone]  [place], [place] - [address] [place] [address] Phone: [phone] Fax: [phone]  [place], [place] - [place] [place] [address] Phone: [phone] Fax: [phone]    Your procedure is scheduled on Thursday, December 12, 2016  Report to [place] at 5:30 A.M.  Call this number if you have problems the morning of surgery:  [phone]   Remember:  Do not eat food or drink liquids after midnight.  Take these medicines the morning of surgery with A SIP OF WATER : carvedilol (COREG),  levothyroxine (SYNTHROID),  omeprazole (PRILOSEC), ADVAIR DISKUS   IF NEEDED: oxyCODONE for pain,  ALPRAZolam Duanne Moron) for anxiety,  oxymetazoline (AFRIN)  nasal spray for dryness or nosebleed,  nitroGLYCERIN (NITROSTAT) for chest pain  Stop taking vitamins, fish oil and herbal medications. Do not take any NSAIDs ie: Ibuprofen, Advil, Naproxen (Aleve), Motrin, BC and Goody Powder; stop now.   How to Manage Your Diabetes Before and After Surgery  Why is it important to control my blood sugar before and after surgery? . Improving blood sugar levels before and after surgery helps healing and can limit problems. . A way of improving blood sugar control is eating a healthy diet by: o  Eating less sugar and carbohydrates o  Increasing activity/exercise o  Talking with your doctor about reaching your blood sugar goals . High blood sugars (greater than 180 mg/dL) can raise your risk of infections and slow your recovery, so you will need to focus on controlling your diabetes during the weeks before surgery. . Make sure that the doctor  who takes care of your diabetes knows about your planned surgery including the date and location.  How do I manage my blood sugar before surgery? . Check your blood sugar at least 4 times a day, starting 2 days before surgery, to make sure that the level is not too high or low. o Check your blood sugar the morning of your surgery when you wake up and every 2 hours until you get to the Short Stay unit. . If your blood sugar is less than 70 mg/dL, you will need to treat for low blood sugar: o Do not take insulin. o Treat a low blood sugar (less than 70 mg/dL) with  cup of clear juice (cranberry or apple), 4 glucose tablets, OR glucose gel. Recheck blood sugar in 15 minutes after treatment (to make sure it is greater than 70 mg/dL). If your blood sugar is not greater than 70 mg/dL on recheck, call [phone] o  for further instructions. . Report your blood sugar to the short stay nurse when you get to Short Stay.  . If you are admitted to the hospital after surgery: o Your blood sugar will be checked by the staff and you will probably be given insulin after surgery (instead of oral diabetes medicines) to make sure you have good blood sugar levels. o The goal for blood sugar control after surgery is 80-180 mg/dL.  WHAT DO I DO ABOUT MY DIABETES MEDICATION?  Marland Kitchen Do not take oral diabetes medicines (pills) the morning of surgery such as metFORMIN (GLUCOPHAGE)  Reviewed  and Endorsed by [facility] Patient Education Committee, August 2015  Do not wear jewelry, make-up or nail polish.  Do not wear lotions, powders, or perfumes, or deoderant.  Do not shave 48 hours prior to surgery.    Do not bring valuables to the hospital.  [facility] is not responsible for any belongings or valuables.  Contacts, dentures or bridgework may not be worn into surgery.  Leave your suitcase in the car.  After surgery it may be brought to your room.  For patients admitted to the hospital, discharge time will be  determined by your treatment team.  Patients discharged the day of surgery will not be allowed to drive home.

## 2016-12-11 NOTE — Progress Notes (Signed)
Pt SDW- Pre-op call completed by pt nurse Suanne Marker, LPN at [facility]. Nurse denies that pt C/O any acute cardiopulmonary issues. Nurse stated that pt last dose of Coumadin was 11/25 and last dose of Lovenox is today. Faxed peri-op prescription to Dr. Caryl Comes, Cardiology ; awaiting response. Please complete anesthesia assessment on DOS; unable to assess. Nurse confirmed receipt of fax and verbalized understanding of all pre-op instructions. Anesthesia asked to review pt history see note.

## 2016-12-12 ENCOUNTER — Ambulatory Visit (HOSPITAL_COMMUNITY): Payer: Medicare HMO | Admitting: Emergency Medicine

## 2016-12-12 ENCOUNTER — Encounter (HOSPITAL_COMMUNITY)
Admission: RE | Disposition: A | Discharge status: Skilled Nursing Facility | Payer: Self-pay | Source: Ambulatory Visit | Attending: General Surgery

## 2016-12-12 ENCOUNTER — Encounter (HOSPITAL_COMMUNITY): Payer: Self-pay | Admitting: Certified Registered Nurse Anesthetist

## 2016-12-12 ENCOUNTER — Ambulatory Visit (HOSPITAL_COMMUNITY)
Admission: RE | Admit: 2016-12-12 | Discharge: 2016-12-12 | Disposition: A | Discharge status: Skilled Nursing Facility | Payer: Medicare HMO | Source: Ambulatory Visit | Attending: General Surgery | Admitting: General Surgery

## 2016-12-12 DIAGNOSIS — Z950 Presence of cardiac pacemaker: Secondary | ICD-10-CM | POA: Diagnosis not present

## 2016-12-12 DIAGNOSIS — I4892 Unspecified atrial flutter: Secondary | ICD-10-CM | POA: Diagnosis not present

## 2016-12-12 DIAGNOSIS — Z885 Allergy status to narcotic agent status: Secondary | ICD-10-CM | POA: Diagnosis not present

## 2016-12-12 DIAGNOSIS — E039 Hypothyroidism, unspecified: Secondary | ICD-10-CM | POA: Insufficient documentation

## 2016-12-12 DIAGNOSIS — I4891 Unspecified atrial fibrillation: Secondary | ICD-10-CM | POA: Insufficient documentation

## 2016-12-12 DIAGNOSIS — Z7901 Long term (current) use of anticoagulants: Secondary | ICD-10-CM | POA: Insufficient documentation

## 2016-12-12 DIAGNOSIS — Z800 Family history of malignant neoplasm of digestive organs: Secondary | ICD-10-CM | POA: Insufficient documentation

## 2016-12-12 DIAGNOSIS — I5022 Chronic systolic (congestive) heart failure: Secondary | ICD-10-CM | POA: Insufficient documentation

## 2016-12-12 DIAGNOSIS — Z79899 Other long term (current) drug therapy: Secondary | ICD-10-CM | POA: Insufficient documentation

## 2016-12-12 DIAGNOSIS — Z888 Allergy status to other drugs, medicaments and biological substances status: Secondary | ICD-10-CM | POA: Diagnosis not present

## 2016-12-12 DIAGNOSIS — J449 Chronic obstructive pulmonary disease, unspecified: Secondary | ICD-10-CM | POA: Diagnosis not present

## 2016-12-12 DIAGNOSIS — Z7982 Long term (current) use of aspirin: Secondary | ICD-10-CM | POA: Diagnosis not present

## 2016-12-12 DIAGNOSIS — E119 Type 2 diabetes mellitus without complications: Secondary | ICD-10-CM | POA: Insufficient documentation

## 2016-12-12 DIAGNOSIS — M1990 Unspecified osteoarthritis, unspecified site: Secondary | ICD-10-CM | POA: Insufficient documentation

## 2016-12-12 DIAGNOSIS — D0359 Melanoma in situ of other part of trunk: Secondary | ICD-10-CM | POA: Insufficient documentation

## 2016-12-12 DIAGNOSIS — Z87891 Personal history of nicotine dependence: Secondary | ICD-10-CM | POA: Insufficient documentation

## 2016-12-12 DIAGNOSIS — I110 Hypertensive heart disease with heart failure: Secondary | ICD-10-CM | POA: Diagnosis not present

## 2016-12-12 DIAGNOSIS — F0390 Unspecified dementia without behavioral disturbance: Secondary | ICD-10-CM | POA: Insufficient documentation

## 2016-12-12 DIAGNOSIS — C4359 Malignant melanoma of other part of trunk: Secondary | ICD-10-CM

## 2016-12-12 DIAGNOSIS — I429 Cardiomyopathy, unspecified: Secondary | ICD-10-CM | POA: Insufficient documentation

## 2016-12-12 DIAGNOSIS — K219 Gastro-esophageal reflux disease without esophagitis: Secondary | ICD-10-CM | POA: Insufficient documentation

## 2016-12-12 HISTORY — DX: Peripheral vascular disease, unspecified: I73.9

## 2016-12-12 HISTORY — DX: Unspecified dementia, unspecified severity, without behavioral disturbance, psychotic disturbance, mood disturbance, and anxiety: F03.90

## 2016-12-12 HISTORY — DX: Unspecified atrial fibrillation: I48.91

## 2016-12-12 HISTORY — DX: Hyperlipidemia, unspecified: E78.5

## 2016-12-12 HISTORY — PX: MELANOMA EXCISION: SHX5266

## 2016-12-12 HISTORY — DX: Sick sinus syndrome: I49.5

## 2016-12-12 HISTORY — DX: Sleep apnea, unspecified: G47.30

## 2016-12-12 HISTORY — DX: Malignant melanoma of skin, unspecified: C43.9

## 2016-12-12 HISTORY — DX: Presence of cardiac pacemaker: Z95.0

## 2016-12-12 LAB — COMPREHENSIVE METABOLIC PANEL
ALBUMIN: 3.2 g/dL — AB (ref 3.5–5.0)
ALT: 14 U/L (ref 14–54)
AST: 21 U/L (ref 15–41)
Alkaline Phosphatase: 56 U/L (ref 38–126)
Anion gap: 8 (ref 5–15)
BILIRUBIN TOTAL: 0.5 mg/dL (ref 0.3–1.2)
BUN: 23 mg/dL — AB (ref 6–20)
CHLORIDE: 105 mmol/L (ref 101–111)
CO2: 25 mmol/L (ref 22–32)
CREATININE: 0.99 mg/dL (ref 0.44–1.00)
Calcium: 9.1 mg/dL (ref 8.9–10.3)
GFR calc Af Amer: >60 mL/min (ref >60)
GFR calc non Af Amer: 54 mL/min — ABNORMAL LOW (ref >60)
GLUCOSE: 134 mg/dL — AB (ref 65–99)
POTASSIUM: 4.3 mmol/L (ref 3.5–5.1)
Sodium: 138 mmol/L (ref 135–145)
Total Protein: 6.2 g/dL — ABNORMAL LOW (ref 6.5–8.1)

## 2016-12-12 LAB — CBC WITH DIFFERENTIAL/PLATELET
BASOS ABS: 0 10*3/uL (ref 0.0–0.1)
BASOS PCT: 1 %
Eosinophils Absolute: 0.1 10*3/uL (ref 0.0–0.7)
Eosinophils Relative: 3 %
HEMATOCRIT: 33.4 % — AB (ref 36.0–46.0)
Hemoglobin: 10.8 g/dL — ABNORMAL LOW (ref 12.0–15.0)
LYMPHS PCT: 26 %
Lymphs Abs: 1.5 10*3/uL (ref 0.7–4.0)
MCH: 28.1 pg (ref 26.0–34.0)
MCHC: 32.3 g/dL (ref 30.0–36.0)
MCV: 87 fL (ref 78.0–100.0)
MONO ABS: 0.6 10*3/uL (ref 0.1–1.0)
Monocytes Relative: 10 %
NEUTROS ABS: 3.5 10*3/uL (ref 1.7–7.7)
Neutrophils Relative %: 60 %
PLATELETS: 164 10*3/uL (ref 150–400)
RBC: 3.84 MIL/uL — AB (ref 3.87–5.11)
RDW: 14.1 % (ref 11.5–15.5)
WBC: 5.7 10*3/uL (ref 4.0–10.5)

## 2016-12-12 LAB — PROTIME-INR
INR: 1.14
Prothrombin Time: 14.5 seconds (ref 11.4–15.2)

## 2016-12-12 LAB — GLUCOSE, CAPILLARY
GLUCOSE-CAPILLARY: 119 mg/dL — AB (ref 65–99)
Glucose-Capillary: 113 mg/dL — ABNORMAL HIGH (ref 65–99)

## 2016-12-12 LAB — HEMOGLOBIN A1C
Hgb A1c MFr Bld: 5.8 % — ABNORMAL HIGH (ref 4.8–5.6)
Mean Plasma Glucose: 119.76 mg/dL

## 2016-12-12 SURGERY — EXCISION, MELANOMA
Anesthesia: General | Site: Chest

## 2016-12-12 MED ORDER — CEFAZOLIN SODIUM-DEXTROSE 2-4 GM/100ML-% IV SOLN
INTRAVENOUS | Status: AC
  Filled 2016-12-12: qty 100

## 2016-12-12 MED ORDER — DEXAMETHASONE SODIUM PHOSPHATE 10 MG/ML IJ SOLN
INTRAMUSCULAR | Status: AC
  Filled 2016-12-12: qty 1

## 2016-12-12 MED ORDER — PHENYLEPHRINE HCL 10 MG/ML IJ SOLN
INTRAVENOUS | Status: DC | PRN
  Administered 2016-12-12: 20 ug/min via INTRAVENOUS

## 2016-12-12 MED ORDER — GABAPENTIN 300 MG PO CAPS
300.0000 mg | ORAL_CAPSULE | ORAL | Status: AC
  Administered 2016-12-12: 300 mg via ORAL
  Filled 2016-12-12: qty 1

## 2016-12-12 MED ORDER — FENTANYL CITRATE (PF) 100 MCG/2ML IJ SOLN: 25.0000 ug | INTRAMUSCULAR | Status: DC | PRN

## 2016-12-12 MED ORDER — LIDOCAINE HCL (CARDIAC) 20 MG/ML IV SOLN
INTRAVENOUS | Status: DC | PRN
  Administered 2016-12-12: 60 mg via INTRAVENOUS

## 2016-12-12 MED ORDER — LIDOCAINE HCL (PF) 1 % IJ SOLN
INTRAMUSCULAR | Status: AC
  Filled 2016-12-12: qty 30

## 2016-12-12 MED ORDER — OXYCODONE HCL 5 MG PO TABS: 5.0000 mg | ORAL_TABLET | Freq: Four times a day (QID) | ORAL | 0 refills | Status: DC | PRN

## 2016-12-12 MED ORDER — FENTANYL CITRATE (PF) 100 MCG/2ML IJ SOLN
INTRAMUSCULAR | Status: DC | PRN
  Administered 2016-12-12: 25 ug via INTRAVENOUS
  Administered 2016-12-12: 75 ug via INTRAVENOUS

## 2016-12-12 MED ORDER — ACETAMINOPHEN 500 MG PO TABS
1000.0000 mg | ORAL_TABLET | ORAL | Status: AC
  Administered 2016-12-12: 1000 mg via ORAL
  Filled 2016-12-12: qty 2

## 2016-12-12 MED ORDER — 0.9 % SODIUM CHLORIDE (POUR BTL) OPTIME
TOPICAL | Status: DC | PRN
  Administered 2016-12-12: 1000 mL

## 2016-12-12 MED ORDER — LACTATED RINGERS IV SOLN
INTRAVENOUS | Status: DC | PRN
  Administered 2016-12-12: 07:00:00 via INTRAVENOUS

## 2016-12-12 MED ORDER — HYDROMORPHONE HCL 1 MG/ML IJ SOLN: 0.2500 mg | INTRAMUSCULAR | Status: DC | PRN

## 2016-12-12 MED ORDER — ROCURONIUM BROMIDE 10 MG/ML (PF) SYRINGE
PREFILLED_SYRINGE | INTRAVENOUS | Status: AC
  Filled 2016-12-12: qty 5

## 2016-12-12 MED ORDER — CHLORHEXIDINE GLUCONATE CLOTH 2 % EX PADS: 6.0000 | MEDICATED_PAD | Freq: Once | CUTANEOUS | Status: DC

## 2016-12-12 MED ORDER — PROPOFOL 10 MG/ML IV BOLUS
INTRAVENOUS | Status: DC | PRN
Start: 2016-12-12 — End: 2016-12-12
  Administered 2016-12-12: 170 mg via INTRAVENOUS

## 2016-12-12 MED ORDER — FENTANYL CITRATE (PF) 250 MCG/5ML IJ SOLN
INTRAMUSCULAR | Status: AC
  Filled 2016-12-12: qty 5

## 2016-12-12 MED ORDER — PROMETHAZINE HCL 25 MG/ML IJ SOLN: 6.2500 mg | INTRAMUSCULAR | Status: DC | PRN

## 2016-12-12 MED ORDER — BUPIVACAINE-EPINEPHRINE (PF) 0.25% -1:200000 IJ SOLN
INTRAMUSCULAR | Status: DC | PRN
  Administered 2016-12-12: 23 mL via INTRAMUSCULAR

## 2016-12-12 MED ORDER — MIDAZOLAM HCL 5 MG/5ML IJ SOLN
INTRAMUSCULAR | Status: DC | PRN
  Administered 2016-12-12: .25 mg via INTRAVENOUS

## 2016-12-12 MED ORDER — LIDOCAINE 2% (20 MG/ML) 5 ML SYRINGE
INTRAMUSCULAR | Status: AC
  Filled 2016-12-12: qty 5

## 2016-12-12 MED ORDER — ONDANSETRON HCL 4 MG/2ML IJ SOLN
INTRAMUSCULAR | Status: AC
  Filled 2016-12-12: qty 2

## 2016-12-12 MED ORDER — ONDANSETRON HCL 4 MG/2ML IJ SOLN
INTRAMUSCULAR | Status: DC | PRN
  Administered 2016-12-12 (×2): 2 mg via INTRAVENOUS

## 2016-12-12 MED ORDER — PROPOFOL 10 MG/ML IV BOLUS
INTRAVENOUS | Status: AC
  Filled 2016-12-12: qty 20

## 2016-12-12 MED ORDER — CEFAZOLIN SODIUM-DEXTROSE 2-4 GM/100ML-% IV SOLN
2.0000 g | INTRAVENOUS | Status: AC
  Administered 2016-12-12: 2 g via INTRAVENOUS

## 2016-12-12 MED ORDER — DEXAMETHASONE SODIUM PHOSPHATE 10 MG/ML IJ SOLN
INTRAMUSCULAR | Status: DC | PRN
  Administered 2016-12-12: 10 mg via INTRAVENOUS

## 2016-12-12 MED ORDER — MIDAZOLAM HCL 2 MG/2ML IJ SOLN
INTRAMUSCULAR | Status: AC
  Filled 2016-12-12: qty 2

## 2016-12-12 MED ORDER — BUPIVACAINE-EPINEPHRINE (PF) 0.25% -1:200000 IJ SOLN
INTRAMUSCULAR | Status: AC
  Filled 2016-12-12: qty 30

## 2016-12-12 MED ORDER — PHENYLEPHRINE HCL 10 MG/ML IJ SOLN
INTRAMUSCULAR | Status: DC | PRN
  Administered 2016-12-12 (×2): 20 ug via INTRAVENOUS
  Administered 2016-12-12 (×2): 40 ug via INTRAVENOUS

## 2016-12-12 MED ORDER — OXYCODONE HCL 5 MG/5ML PO SOLN: 5.0000 mg | Freq: Once | ORAL | Status: DC | PRN

## 2016-12-12 MED ORDER — OXYCODONE HCL 5 MG PO TABS: 5.0000 mg | ORAL_TABLET | Freq: Once | ORAL | Status: DC | PRN

## 2016-12-12 SURGICAL SUPPLY — 52 items
ADH SKN CLS APL DERMABOND .7 (GAUZE/BANDAGES/DRESSINGS) ×1
APL SKNCLS STERI-STRIP NONHPOA (GAUZE/BANDAGES/DRESSINGS) ×1
BENZOIN TINCTURE PRP APPL 2/3 (GAUZE/BANDAGES/DRESSINGS) ×3 IMPLANT
BLADE SURG 10 STRL SS (BLADE) ×2 IMPLANT
CANISTER SUCT 3000ML PPV (MISCELLANEOUS) ×3 IMPLANT
CHLORAPREP W/TINT 26ML (MISCELLANEOUS) ×3 IMPLANT
CLOSURE WOUND 1/2 X4 (GAUZE/BANDAGES/DRESSINGS) ×1
COVER SURGICAL LIGHT HANDLE (MISCELLANEOUS) ×3 IMPLANT
DERMABOND ADVANCED (GAUZE/BANDAGES/DRESSINGS) ×2
DERMABOND ADVANCED .7 DNX12 (GAUZE/BANDAGES/DRESSINGS) ×1 IMPLANT
DRAPE LAPAROSCOPIC ABDOMINAL (DRAPES) IMPLANT
DRAPE LAPAROTOMY 100X72 PEDS (DRAPES) ×2 IMPLANT
DRAPE UTILITY XL STRL (DRAPES) ×6 IMPLANT
DRSG TEGADERM 4X4.75 (GAUZE/BANDAGES/DRESSINGS) ×6 IMPLANT
ELECT CAUTERY BLADE 6.4 (BLADE) ×3 IMPLANT
ELECT REM PT RETURN 9FT ADLT (ELECTROSURGICAL) ×3
ELECTRODE REM PT RTRN 9FT ADLT (ELECTROSURGICAL) ×1 IMPLANT
GAUZE SPONGE 4X4 12PLY STRL (GAUZE/BANDAGES/DRESSINGS) IMPLANT
GAUZE SPONGE 4X4 12PLY STRL LF (GAUZE/BANDAGES/DRESSINGS) ×3 IMPLANT
GAUZE SPONGE 4X4 16PLY XRAY LF (GAUZE/BANDAGES/DRESSINGS) ×3 IMPLANT
GLOVE BIO SURGEON STRL SZ 6 (GLOVE) ×3 IMPLANT
GLOVE BIOGEL PI IND STRL 6.5 (GLOVE) ×1 IMPLANT
GLOVE BIOGEL PI INDICATOR 6.5 (GLOVE) ×2
GOWN STRL REUS W/ TWL LRG LVL3 (GOWN DISPOSABLE) ×1 IMPLANT
GOWN STRL REUS W/TWL 2XL LVL3 (GOWN DISPOSABLE) ×3 IMPLANT
GOWN STRL REUS W/TWL LRG LVL3 (GOWN DISPOSABLE) ×3
KIT BASIN OR (CUSTOM PROCEDURE TRAY) ×3 IMPLANT
KIT ROOM TURNOVER OR (KITS) ×3 IMPLANT
NDL HYPO 25GX1X1/2 BEV (NEEDLE) ×1 IMPLANT
NEEDLE HYPO 25GX1X1/2 BEV (NEEDLE) ×3 IMPLANT
NS IRRIG 1000ML POUR BTL (IV SOLUTION) ×3 IMPLANT
PACK SURGICAL SETUP 50X90 (CUSTOM PROCEDURE TRAY) ×3 IMPLANT
PAD ARMBOARD 7.5X6 YLW CONV (MISCELLANEOUS) ×6 IMPLANT
PENCIL BUTTON HOLSTER BLD 10FT (ELECTRODE) ×3 IMPLANT
SPECIMEN JAR SMALL (MISCELLANEOUS) ×3 IMPLANT
SPONGE LAP 18X18 X RAY DECT (DISPOSABLE) ×2 IMPLANT
STRIP CLOSURE SKIN 1/2X4 (GAUZE/BANDAGES/DRESSINGS) ×1 IMPLANT
SUT ETHILON 2 0 FS 18 (SUTURE) ×2 IMPLANT
SUT MON AB 4-0 PC3 18 (SUTURE) ×3 IMPLANT
SUT SILK 2 0 PERMA HAND 18 BK (SUTURE) ×2 IMPLANT
SUT VIC AB 2-0 SH 27 (SUTURE) ×9
SUT VIC AB 2-0 SH 27XBRD (SUTURE) IMPLANT
SUT VIC AB 3-0 SH 27 (SUTURE) ×6
SUT VIC AB 3-0 SH 27X BRD (SUTURE) ×1 IMPLANT
SYR BULB 3OZ (MISCELLANEOUS) ×3 IMPLANT
SYR CONTROL 10ML LL (SYRINGE) ×3 IMPLANT
TOWEL OR 17X24 6PK STRL BLUE (TOWEL DISPOSABLE) ×3 IMPLANT
TOWEL OR 17X26 10 PK STRL BLUE (TOWEL DISPOSABLE) ×3 IMPLANT
TUBE CONNECTING 12'X1/4 (SUCTIONS) ×1
TUBE CONNECTING 12X1/4 (SUCTIONS) ×2 IMPLANT
WATER STERILE IRR 1000ML POUR (IV SOLUTION) IMPLANT
YANKAUER SUCT BULB TIP NO VENT (SUCTIONS) ×2 IMPLANT

## 2016-12-12 NOTE — Transfer of Care (Signed)
Immediate Anesthesia Transfer of Care Note  Patient: Sarah Moss  Procedure(s) Performed: WIDE LOCAL EXCISION WITH ADVANCEMENT FLAP CLOSURE FOR CHEST WALL MELANOMA (N/A Chest)  Patient Location: PACU  Anesthesia Type:General  Level of Consciousness: awake, oriented, drowsy, patient cooperative and responds to stimulation  Airway & Oxygen Therapy: Patient Spontanous Breathing and Patient connected to face mask oxygen  Post-op Assessment: Report given to RN, Post -op Vital signs reviewed and stable and Patient moving all extremities  Post vital signs: Reviewed and stable  Last Vitals:  Vitals:   12/12/16 0610  BP: (!) 162/57  Pulse: 73  Resp: 19  Temp: 36.8 C  SpO2: 98%    Last Pain:  Vitals:   12/12/16 0610  TempSrc: Oral      Patients Stated Pain Goal: 7 (78/46/96 2952)  Complications: No apparent anesthesia complications

## 2016-12-12 NOTE — Anesthesia Preprocedure Evaluation (Addendum)
Anesthesia Evaluation  Patient identified by MRN, date of birth, ID band Patient awake    Reviewed: Allergy & Precautions, NPO status , Patient's Chart, lab work & pertinent test results, reviewed documented beta blocker date and time   Airway Mallampati: II  TM Distance: >3 FB Neck ROM: Full    Dental no notable dental hx.    Pulmonary neg pulmonary ROS, shortness of breath, asthma , sleep apnea , COPD,  COPD inhaler, former smoker,    Pulmonary exam normal breath sounds clear to auscultation       Cardiovascular Exercise Tolerance: Poor hypertension, Pt. on medications and Pt. on home beta blockers +CHF  negative cardio ROS Normal cardiovascular exam+ dysrhythmias Atrial Fibrillation + pacemaker  Rhythm:Regular Rate:Normal     Neuro/Psych Anxiety Depression negative neurological ROS  negative psych ROS   GI/Hepatic negative GI ROS, Neg liver ROS, GERD  ,  Endo/Other  negative endocrine ROSdiabetes, Type 2, Oral Hypoglycemic AgentsHypothyroidism   Renal/GU Renal InsufficiencyRenal diseasenegative Renal ROS  negative genitourinary   Musculoskeletal negative musculoskeletal ROS (+) Arthritis , Osteoarthritis,    Abdominal   Peds negative pediatric ROS (+)  Hematology negative hematology ROS (+)   Anesthesia Other Findings   Reproductive/Obstetrics negative OB ROS                            Anesthesia Physical Anesthesia Plan  ASA: III  Anesthesia Plan: General   Post-op Pain Management:    Induction: Intravenous  PONV Risk Score and Plan: 3 and Ondansetron, Dexamethasone and Midazolam  Airway Management Planned: Oral ETT  Additional Equipment: Arterial line  Intra-op Plan:   Post-operative Plan: Extubation in OR  Informed Consent: I have reviewed the patients History and Physical, chart, labs and discussed the procedure including the risks, benefits and alternatives for the  proposed anesthesia with the patient or authorized representative who has indicated his/her understanding and acceptance.   Dental advisory given  Plan Discussed with: CRNA  Anesthesia Plan Comments:         Anesthesia Quick Evaluation

## 2016-12-12 NOTE — Discharge Instructions (Addendum)
[facility] Office Phone Number [phone]   POST OP INSTRUCTIONS  Always review your discharge instruction sheet given to you by the facility where your surgery was performed.  IF YOU HAVE DISABILITY OR FAMILY LEAVE FORMS, YOU MUST BRING THEM TO THE OFFICE FOR PROCESSING.  DO NOT GIVE THEM TO YOUR DOCTOR.  1. A prescription for pain medication may be given to you upon discharge.  Take your pain medication as prescribed, if needed.  If narcotic pain medicine is not needed, then you may take acetaminophen (Tylenol) or ibuprofen (Advil) as needed. 2. Take your usually prescribed medications unless otherwise directed 3. If you need a refill on your pain medication, please contact your pharmacy.  They will contact our office to request authorization.  Prescriptions will not be filled after 5pm or on week-ends. 4. You should eat very light the first 24 hours after surgery, such as soup, crackers, pudding, etc.  Resume your normal diet the day after surgery 5. It is common to experience some constipation if taking pain medication after surgery.  Increasing fluid intake and taking a stool softener will usually help or prevent this problem from occurring.  A mild laxative (Milk of Magnesia or Miralax) should be taken according to package directions if there are no bowel movements after 48 hours. 6. You may shower in 48 hours.  The surgical glue will flake off in 2-3 weeks.   7. ACTIVITIES:  No strenuous activity or heavy lifting for 1 week.   a. You may drive when you no longer are taking prescription pain medication, you can comfortably wear a seatbelt, and you can safely maneuver your car and apply brakes. b. RETURN TO WORK:  __________n/a_______________ You should see your doctor in the office for a follow-up appointment approximately three-four weeks after your surgery.    WHEN TO CALL YOUR DOCTOR: 1. Fever over 101.0 2. Nausea and/or vomiting. 3. Extreme swelling or  bruising. 4. Continued bleeding from incision. 5. Increased pain, redness, or drainage from the incision.  The clinic staff is available to answer your questions during regular business hours.  Please don't hesitate to call and ask to speak to one of the nurses for clinical concerns.  If you have a medical emergency, go to the nearest emergency room or call 911.  A surgeon from [facility] is always on call at the hospital.  For further questions, please visit [email or web address]      

## 2016-12-12 NOTE — Anesthesia Procedure Notes (Signed)
Arterial Line Insertion Start/End11/29/2018 7:27 AM Performed by: Sammie Bench, CRNA, CRNA  Preanesthetic checklist: patient identified, IV checked, site marked, risks and benefits discussed, surgical consent, monitors and equipment checked, pre-op evaluation and timeout performed Lidocaine 1% used for infiltration Left, radial was placed Catheter size: 20 G Hand hygiene performed , maximum sterile barriers used  and Seldinger technique used  Attempts: 3 Procedure performed without using ultrasound guided technique. Following insertion, Biopatch and dressing applied. Post procedure assessment: unchanged  Patient tolerated the procedure well with no immediate complications.

## 2016-12-12 NOTE — Anesthesia Procedure Notes (Signed)
Procedure Name: LMA Insertion Date/Time: 12/12/2016 8:00 AM Performed by: Lynda Rainwater, MD Pre-anesthesia Checklist: Patient identified, Emergency Drugs available, Suction available, Patient being monitored and Timeout performed Patient Re-evaluated:Patient Re-evaluated prior to induction Oxygen Delivery Method: Circle system utilized Preoxygenation: Pre-oxygenation with 100% oxygen Induction Type: IV induction Ventilation: Mask ventilation without difficulty LMA: LMA inserted LMA Size: 4.0 Number of attempts: 1 Tube secured with: Tape Dental Injury: Teeth and Oropharynx as per pre-operative assessment

## 2016-12-12 NOTE — Anesthesia Postprocedure Evaluation (Signed)
Anesthesia Post Note  Patient: Sarah Moss  Procedure(s) Performed: WIDE LOCAL EXCISION WITH ADVANCEMENT FLAP CLOSURE FOR CHEST WALL MELANOMA (N/A Chest)     Patient location during evaluation: PACU Anesthesia Type: General Level of consciousness: awake and alert Pain management: pain level controlled Vital Signs Assessment: post-procedure vital signs reviewed and stable Respiratory status: spontaneous breathing, nonlabored ventilation and respiratory function stable Cardiovascular status: blood pressure returned to baseline and stable Postop Assessment: no apparent nausea or vomiting Anesthetic complications: no    Last Vitals:  Vitals:   12/12/16 1053 12/12/16 1100  BP:    Pulse: 71 70  Resp: 11 (!) 9  Temp:  (!) 36.3 C  SpO2: 97% 96%    Last Pain:  Vitals:   12/12/16 0610  TempSrc: Oral                 Lynda Rainwater

## 2016-12-12 NOTE — Anesthesia Procedure Notes (Signed)
Arterial Line Insertion Start/End11/29/2018 7:20 AM, 12/12/2016 7:42 AM Performed by: Lynda Rainwater, MD, Sammie Bench, CRNA, CRNA  Patient location: Pre-op. Preanesthetic checklist: patient identified, IV checked, site marked, risks and benefits discussed, surgical consent, monitors and equipment checked, pre-op evaluation, timeout performed and anesthesia consent Lidocaine 1% used for infiltration radial was placed Catheter size: 20 G Hand hygiene performed , maximum sterile barriers used  and Seldinger technique used Allen's test indicative of satisfactory collateral circulation Attempts: 3 Procedure performed without using ultrasound guided technique. Ultrasound Notes:anatomy identified Following insertion, dressing applied and Biopatch. Post procedure assessment: normal  Patient tolerated the procedure well with no immediate complications. Additional procedure comments: Dion Saucier made two attempts prior to my own for placing a-line for the procedure. Marland Kitchen

## 2016-12-12 NOTE — Interval H&P Note (Signed)
History and Physical Interval Note:  12/12/2016 7:47 AM  Sarah Moss  has presented today for surgery, with the diagnosis of MELANOMA CHEST WALL  The various methods of treatment have been discussed with the patient and family. After consideration of risks, benefits and other options for treatment, the patient has consented to  Procedure(s): [place] (N/A) as a surgical intervention .  The patient's history has been reviewed, patient examined, no change in status, stable for surgery.  I have reviewed the patient's chart and labs.  Questions were answered to the patient's satisfaction.     Stark Klein

## 2016-12-12 NOTE — Op Note (Signed)
PRE-OPERATIVE DIAGNOSIS: at least pT1N0 chest wall melanoma  POST-OPERATIVE DIAGNOSIS:  Same  PROCEDURE:  Procedure(s): Wide local excision 2 cm margins, advancement flap closure for defect 5.6 cm x 6.6 cm  SURGEON:  Surgeon(s): Stark Klein, MD  ANESTHESIA:   local and general  DRAINS: none   LOCAL MEDICATIONS USED:  MARCAINE    and XYLOCAINE   SPECIMEN:  Source of Specimen:  Chest wall melanoma  FINDINGS:  No gross residual disease.  DISPOSITION OF SPECIMEN:  PATHOLOGY  COUNTS:  YES  PLAN OF CARE: Discharge to home after PACU  PATIENT DISPOSITION:  PACU - hemodynamically stable.    PROCEDURE:   Pt was identified in the holding area, taken to the OR, and placed supine on the OR table.  General anesthesia was induced.  The patient's chest was prepped and draped in sterile fashion.  Time out was performed according to the surgical safety checklist.  When all was correct, we continued.     The melanoma was identified and 2 cm margins were marked out due to the positive margins previously and the surgical risk of the patient. Local was administered under the melanoma and the adjacent tissue.  A #10 blade was used to incise the skin around the melanoma.  The cautery was used to take the dissection down to the fascia.  The skin was marked in situ with silk suture.  The cautery was used to take the specimen off the fascia, and it was passed off the table.    Skin hooks were used to elevate the edges of the incision and the skin was freed up in all directions. The incision was already fairly elliptical, but the superior tissue was taken to decrease the dog ear.  The skin was pulled together. Deep interrupted 2-0 vicryl sutures were placed to relieve tension.  The skin was then reapproximated with 3-0 interrupted vicryl deep dermal sutures and 4-0 monocryl running subcuticular sutures.  Three 2-0 nylon horizontal mattress sutures were placed as well.  The wound was dressed with Benzoin,  steristrips, gauze, and tegaderm.    Needle, sponge, and instrument counts were correct.  The patient was allowed to emerge from anesthesia and taken to the PACU in stable condition.

## 2016-12-12 NOTE — H&P (Signed)
Sarah Moss 11/28/2016 2:14 PM Location: [facility] Patient #: [id number] DOB: 01-31-1940 Widowed / Language: Cleophus Molt / Race: White Female   History of Present Illness Stark Klein MD; 11/29/2016 11:38 AM) The patient is a 76 year old female who presents for a Follow-up for Malignant melanoma. Pt is a 76 yo F referred by Dr. Fontaine No for diagnosis of melanoma over her sternum in February 2018. The patient presented with an enlarging textured brownish lesion over her sternum with some crustiness. She denies itching or bleeding. She has no personal history of melanoma in the past, but had a mother with melanoma. The patient has had other lesions, but no prior cancers. She has an extensive family history of cancer. Her daughter had breast cancer and colon polyps. Sister had colon cancer. Her son had colon polyps and cancer.    After her last visit, her cardiologist risk stratified her to high risk. She has had growth of lesion and we sent her back to cardiology. She has had some medication optimization and has been downgraded to medium risk. She has been cleared to hold coumadin with lovenox bridge.    Pathology shave bx 03/08/2016 Superficial spreading clarks IV, Breslow 0.9 mm Positive deep and peripheral margin positive.  ulceration - neg LVI - neg regression 25% TIL - brisk satellitosis - neg mitoses 1-3/mm2    Allergies Alean Rinne, RMA; 11/29/2016 11:11 AM) Codeine and Related  Ativan *ANTIANXIETY AGENTS*  Ambien *HYPNOTICS/SEDATIVES/SLEEP DISORDER AGENTS*  Allergies Reconciled   Medication History Alean Rinne, RMA; 11/29/2016 11:12 AM) Medications Reconciled Aspirin (81MG  Tablet, Oral) Active. Niferex (Oral) Specific strength unknown - Active. Lisinopril (20MG  Tablet, Oral) Active. Vitamin D3 (50000UNIT Capsule, Oral) Active. Cymbalta (60MG  Capsule DR Part, Oral) Active. Lipitor (40MG  Tablet, Oral) Active. Singulair (10MG   Tablet, Oral) Active. Neurontin (300MG  Capsule, Oral) Active. Advair Diskus (250-50MCG/DOSE Aero Pow Br Act, Inhalation) Active. Carvedilol (6.25MG  Tablet, Oral) Active. Senna (Oral) Specific strength unknown - Active. MetFORMIN HCl (1000MG  Tablet, Oral) Active. Furosemide (40MG  Tablet, Oral) Active. Doxycycline Hyclate (100MG  Tablet, Oral) Active. Oxycodone-Acetaminophen (5-325MG  Tablet, Oral) Active. Ventolin (90MCG/ACT Aerosol Soln, Inhalation) Active. ALPRAZolam (0.25MG  Tablet, Oral) Active. Nitrostat (0.4MG  Tab Sublingual, Sublingual) Active. Levothyroxine Sodium (25MCG Tablet, Oral) Active. Omeprazole (40MG  Capsule DR, Oral) Active. Warfarin Sodium (5MG  Tablet, Oral) Active.    Review of Systems Stark Klein MD; 11/28/2016 2:16 PM) All other systems negative  Vitals Alean Rinne RMA; 11/29/2016 11:11 AM) 11/29/2016 11:10 AM Weight: 148 lb Height: 62in Body Surface Area: 1.68 m Body Mass Index: 27.07 kg/m  Temp.: 98.74F  Pulse: 69 (Regular)  BP: 140/80 (Sitting, Left Arm, Standard)       Physical Exam Stark Klein MD; 11/29/2016 11:40 AM) The physical exam findings are as follows: Note:in wheelchair  General Mental Status-Alert. General Appearance-Consistent with stated age. Hydration-Well hydrated. Voice-Normal.  Integumentary Note: scar on midsternum having some growth and pigmentation.   Eye Sclera/Conjunctiva - Bilateral-No scleral icterus.  Chest and Lung Exam Chest and lung exam reveals -quiet, even and easy respiratory effort with no use of accessory muscles. Inspection Chest Wall - Normal. Back - normal.  Cardiovascular Cardiovascular examination reveals -normal pedal pulses bilaterally. Note: regular rate and rhythm  Abdomen Inspection-Inspection Normal. Palpation/Percussion Palpation and Percussion of the abdomen reveal - Soft, Non Tender, No Rebound tenderness, No Rigidity (guarding) and No  hepatosplenomegaly.  Lymphatic Head & Neck  General Head & Neck Lymphatics: Bilateral - Description - Normal. Axillary  General Axillary Region: Bilateral - Description - Normal. Tenderness -  Non Tender.    Assessment & Plan Stark Klein MD; 11/29/2016 11:41 AM) MALIGNANT MELANOMA OF CHEST WALL (C43.59) Impression: Will plan WLE with advancement flap closure. No SLN bx due to pt age/performance status. She will have a higher risk of wound complications since she is anticoagulated.  Surgery is planned 11/29 first thing in AM.  I will plan a wide local excision with 1 cm margins and advancement flap closure. Reviewed risks with her and 2 daughters. Discussed bleeding, infection, numbness, wound dehiscence, heart/lung complications, recurrent melanoma, and more. WARFARIN ANTICOAGULATION (Z79.01) Impression: lovenox bridge script written. Schedule written out for nursing home as well. Current Plans Started Enoxaparin Sodium 60MG /0.6ML, 1 (one) Milliliter every morning as scheduled., 6 Milliliter, 11/29/2016, No Refill. Local Order: Take 60 mg in AM 11/26, 11/27, 11/28, skip 11/29, take 11/30, 12/1, and 12/2.   Signed by Stark Klein, MD (11/29/2016 11:41 AM)

## 2016-12-13 ENCOUNTER — Encounter (HOSPITAL_COMMUNITY): Payer: Self-pay | Admitting: General Surgery

## 2016-12-18 NOTE — Progress Notes (Signed)
Please let patient's family know that margins are negative.

## 2016-12-19 ENCOUNTER — Encounter: Payer: Self-pay | Admitting: Family

## 2016-12-19 ENCOUNTER — Ambulatory Visit (HOSPITAL_COMMUNITY)
Admission: RE | Admit: 2016-12-19 | Discharge: 2016-12-19 | Disposition: A | Discharge status: Home / Self Care | Payer: Medicare HMO | Source: Ambulatory Visit | Attending: Family | Admitting: Family

## 2016-12-19 ENCOUNTER — Ambulatory Visit (INDEPENDENT_AMBULATORY_CARE_PROVIDER_SITE_OTHER)
Admission: RE | Admit: 2016-12-19 | Discharge: 2016-12-19 | Disposition: A | Discharge status: Home / Self Care | Payer: Medicare HMO | Source: Ambulatory Visit | Attending: Family | Admitting: Family

## 2016-12-19 ENCOUNTER — Ambulatory Visit (INDEPENDENT_AMBULATORY_CARE_PROVIDER_SITE_OTHER): Payer: Medicare HMO | Admitting: Family

## 2016-12-19 VITALS — BP 148/67 | HR 70 | Temp 97.4°F | Resp 18 | Wt 141.6 lb

## 2016-12-19 DIAGNOSIS — Z95828 Presence of other vascular implants and grafts: Secondary | ICD-10-CM | POA: Diagnosis present

## 2016-12-19 DIAGNOSIS — R140 Abdominal distension (gaseous): Secondary | ICD-10-CM | POA: Insufficient documentation

## 2016-12-19 DIAGNOSIS — I739 Peripheral vascular disease, unspecified: Secondary | ICD-10-CM | POA: Insufficient documentation

## 2016-12-19 DIAGNOSIS — Z89511 Acquired absence of right leg below knee: Secondary | ICD-10-CM

## 2016-12-19 DIAGNOSIS — I771 Stricture of artery: Secondary | ICD-10-CM | POA: Diagnosis not present

## 2016-12-19 DIAGNOSIS — I779 Disorder of arteries and arterioles, unspecified: Secondary | ICD-10-CM

## 2016-12-19 DIAGNOSIS — R9439 Abnormal result of other cardiovascular function study: Secondary | ICD-10-CM | POA: Diagnosis not present

## 2016-12-19 NOTE — Patient Instructions (Signed)

## 2016-12-19 NOTE — Progress Notes (Signed)
[facility] [place]   CC: Follow up peripheral artery occlusive disease  History of Present Illness IVERNA HAMMAC is a 76 y.o. female  who is s/p bilateral common iliac artery stents placed in March 2018 by Dr. Trula Slade and was noted to have an SFA occlusion at the time. At April 2018 visit she had a wound on the dorsum of her foot, this has healed.  She also has a previous traumatic right below-knee amputation. She remains on Coumadin and aspirin. The wound has subsequently healed with control of her edema by Lasix.  Dr. Donzetta Matters last evaluated pt on 05-31-16. At that time the known left SFA occlusion did not need intervention. She should continue her anticoagulation as well as aspirin. Dr. Donzetta Matters discussed protecting her left foot specifically from her wheelchair. The daughter is attempting to get her wheelchair with a foot rest and if she needs prescription from [place] she can call with specific details. We will otherwise plan to see her in 6 months with aortoiliac duplex as well as ABIs at that time.   Pt is walking well with her right BKA prosthesis, no walker or cane. She denies pain or weakness in her legs with walking, sister states pt can walk well with no falling issues.   She had an excision of malignant melanoma of her chest wall on 12-02-16.   [name] of constipation.   Pt denies any hx of stroke ir TIA.  Pt denies tingling, numbness, pain, or cold sensation in either upper extremity.   Pt resides in [facility], sister states for dementia.    Pt Diabetic: Yes, last A1C result on file was 5.8 on 12-12-16 Pt smoker: former smoker, quit about 1998, started smoking in her early 87's  Pt meds include: Statin :Yes Betablocker: Yes ASA: Yes Other anticoagulants/antiplatelets: warfarin, hx of a-fib   Past Medical History:  Diagnosis Date  . Asthma   . Atrial fibrillation ([place])   . CHF (congestive heart failure) ([place])   . COPD (chronic obstructive  pulmonary disease) ([place])   . Dementia   . Diabetes mellitus without complication ([place])   . GERD (gastroesophageal reflux disease)   . Hx of right BKA ([place])   . Hyperlipidemia   . Hypertension   . Melanoma ([place])    chest wall  . Pacemaker   . PAD (peripheral artery disease) ([facility])    L SFA, RCIA, LCIA stents  . Sleep apnea    documented in pt history  . Tachycardia-bradycardia syndrome [facility])     Social History Social History   Tobacco Use  . Smoking status: Former Research scientist (life sciences)  . Smokeless tobacco: Never Used  Substance Use Topics  . Alcohol use: No  . Drug use: No    Family History Family History  Problem Relation Age of Onset  . Stroke Mother   . Diabetes Mother   . Cancer Father     Past Surgical History:  Procedure Laterality Date  . LOWER EXTREMITY ANGIOGRAPHY N/A 03/19/2016   Procedure: Lower Extremity Angiography;  Surgeon: Serafina Mitchell, MD;  Location: [facility];  Service: Cardiovascular;  Laterality: N/A;  . MELANOMA EXCISION N/A 12/12/2016   Procedure: WIDE LOCAL EXCISION WITH ADVANCEMENT FLAP CLOSURE FOR CHEST WALL MELANOMA;  Surgeon: Stark Klein, MD;  Location: [facility];  Service: General;  Laterality: N/A;  . PERIPHERAL VASCULAR INTERVENTION Bilateral 03/19/2016   Procedure: Peripheral Vascular Intervention;  Surgeon: Serafina Mitchell, MD;  Location: [facility]  INVASIVE CV LAB;  Service: Cardiovascular;  Laterality: Bilateral;  iliacs    Allergies  Allergen Reactions  . Ambien [Zolpidem] Other (See Comments)    "Has the opposite effect; makes me more alert/hyper"  . Ativan [Lorazepam] Other (See Comments)    "Makes me confused and crazy"  . Codeine Itching and Nausea And Vomiting    Current Outpatient Medications  Medication Sig Dispense Refill  . ADVAIR DISKUS 250-50 MCG/DOSE AEPB Inhale 1 puff into the lungs 2 (two) times daily.    Marland Kitchen ALPRAZolam (XANAX) 0.25 MG tablet Take 1 tablet (0.25 mg total) by mouth every 12 (twelve) hours as needed for anxiety.  (Patient taking differently: Take 0.25 mg by mouth every 8 (eight) hours as needed for anxiety. ) 10 tablet 0  . aspirin EC 81 MG EC tablet Take 1 tablet (81 mg total) by mouth daily.    Marland Kitchen atorvastatin (LIPITOR) 40 MG tablet Take 40 mg by mouth every evening.     . carvedilol (COREG) 12.5 MG tablet Take 1 tablet (12.5 mg total) by mouth 2 (two) times daily with a meal. 60 tablet 9  . DULoxetine (CYMBALTA) 30 MG capsule Take 30 mg by mouth daily at 8 pm.    . enoxaparin (LOVENOX) 60 MG/0.6ML injection Inject 60 mg into the skin daily.    . furosemide (LASIX) 40 MG tablet Take 40 mg by mouth daily.    Marland Kitchen gabapentin (NEURONTIN) 300 MG capsule Take 300 mg by mouth at bedtime.    . iron polysaccharides (NIFEREX) 150 MG capsule Take 1 capsule (150 mg total) by mouth daily.    Marland Kitchen levothyroxine (SYNTHROID, LEVOTHROID) 25 MCG tablet Take 25 mcg by mouth daily.    Marland Kitchen lidocaine (LIDODERM) 5 % Place 1 patch onto the skin every 12 (twelve) hours. Remove & Discard patch within 12 hours or as directed by MD    . lisinopril (PRINIVIL,ZESTRIL) 20 MG tablet Take 20 mg by mouth daily.    . metFORMIN (GLUCOPHAGE) 1000 MG tablet Take 1,000 mg by mouth 2 (two) times daily.    . montelukast (SINGULAIR) 10 MG tablet Take 10 mg by mouth at bedtime.    . nitroGLYCERIN (NITROSTAT) 0.4 MG SL tablet Place 0.4 mg under the tongue every 5 (five) minutes as needed for chest pain.    Marland Kitchen NUTRITIONAL SUPPLEMENT LIQD Take 120 mLs by mouth 3 (three) times daily. Medpass    . omeprazole (PRILOSEC) 20 MG capsule Take 40 mg by mouth daily.    Marland Kitchen oxyCODONE-acetaminophen (PERCOCET/ROXICET) 5-325 MG tablet Take 1 tablet by mouth every 6 (six) hours as needed for severe pain. (Patient taking differently: Take 1 tablet by mouth every 4 (four) hours as needed for severe pain. ) 10 tablet 0  . oxymetazoline (AFRIN) 0.05 % nasal spray Place 2 sprays into both nostrils daily. May use an additional 2 spray in each nostril every hour as needed for  dryness or nosebleed    . polyethylene glycol (MIRALAX / GLYCOLAX) packet Take 17 g by mouth 2 (two) times daily. 14 each 0  . senna-docusate (SENOKOT-S) 8.6-50 MG tablet Take 1 tablet by mouth 2 (two) times daily.    . Vitamin D, Ergocalciferol, (DRISDOL) 50000 units CAPS capsule Take 50,000 Units by mouth 2 (two) times a week.    . warfarin (COUMADIN) 5 MG tablet Take 5 mg by mouth every evening.     No current facility-administered medications for this visit.     ROS: See HPI for  pertinent positives and negatives.   Physical Examination  Vitals:   12/19/16 1118 12/19/16 1121  BP: (!) 153/72 (!) 148/67  Pulse: 70   Resp: 18   Temp: (!) 97.4 F (36.3 C)   TempSrc: Oral   SpO2: 99%   Weight: 141 lb 9.6 oz (64.2 kg)    Body mass index is 23.56 kg/m.  General: A&O x 2, WDWN, elderly female. Gait: normal, wearing right BKA prosthesis Eyes: PERRLA. Pulmonary: Respirations are non labored, CTAB, good air movement. Mid sternal chest incision healing s/p excision of malignant melanoma on 12-02-16.  Cardiac: regular Rhythm, no detected murmur. Pacemaker palpated left upper chest.      Carotid Bruits Right Left   Negative Negative   Radial pulses: 1+ right, not palpable on left, left ulnar pulse not palpable, left brachial pulse is 1+ palpable. Adominal aortic pulse is slightly palpable                         VASCULAR EXAM: Extremities without ischemic changes, without Gangrene; without open wounds. No skin breakdown at right BKA stump.                                                                                                           LE Pulses Right Left       FEMORAL  not palpable  1+ palpable        POPLITEAL  not palpable   not palpable       POSTERIOR TIBIAL  BKA   not palpable        DORSALIS PEDIS      ANTERIOR TIBIAL BKA  not palpable     Abdomen: soft, NT, no palpable masses. Skin: no rashes, no ulcers noted. Musculoskeletal: no muscle wasting or  atrophy.  Neurologic: A&O X 2; Appropriate Affect ; SENSATION: normal; MOTOR FUNCTION:  moving all extremities equally, motor strength 5/5 throughout. Speech is fluent/normal. CN 2-12 intact.   ASSESSMENT: NOGA FOGG is a 76 y.o. female who is s/p bilateral common iliac artery stents placed in March 2018 by Dr. Trula Slade and was noted to have an SFA occlusion at the time. At April 2018 visit she had a wound on the dorsum of her foot, this has healed.  She also has a previous traumatic right below-knee amputation.   She walks well with her right BKA prosthesis, is steady with no cane or walker. She does not c/o claudication sx's with walking.  There are no ulcers or signs of ischemia at her right BKA stump or left foot.   DATA  Bilateral Iliac Artery Stent Duplex (12/19/16): Right CFA 112 cm/s velocity with monophasic waveforms. Left CFA with 162 cm/s with biphasic waveforms.  No color flow noted in the left proximal SFA, known occlusion.  Technically difficult exam due to excessive bowel gas.  The right CIA stent wall is not well visualized but appears patent, velocity distal to the stent (409 cm/s) probably in the 75-99% range.  The left CIA stent appears  patent.  No prior exam at this facility for comparison.    ABI (Date: 12/19/2016):  R:   BKA  L:   ABI: [place] (was [place] on 03-20-16),   PT: mono  DP: mono  TBI: 0.42  Right BKA. Non compressible vessels in left ankle, monophasic waveforms, left toe pressure of 71.    PLAN:  Based on the patient's vascular studies and examination, and after discussing with Dr. Oneida Alar, pt will return to clinic in 1-2 weeks weeks, see Dr. Donzetta Matters to discuss options to address stenosis distal to right CIA stent.   I discussed in depth with the patient the nature of atherosclerosis, and emphasized the importance of maximal medical management including strict control of blood pressure, blood glucose, and lipid levels, obtaining regular exercise,  and continued cessation of smoking.  The patient is aware that without maximal medical management the underlying atherosclerotic disease process will progress, limiting the benefit of any interventions.  The patient was given information about PAD including signs, symptoms, treatment, what symptoms should prompt the patient to seek immediate medical care, and risk reduction measures to take.  Clemon Chambers, RN, MSN, FNP-C [facility] Phone: [phone]  Clinic MD: [facility]  12/19/16 11:37 AM

## 2017-01-03 ENCOUNTER — Other Ambulatory Visit: Payer: Self-pay

## 2017-01-03 ENCOUNTER — Ambulatory Visit: Payer: Medicare HMO | Admitting: Vascular Surgery

## 2017-01-03 ENCOUNTER — Encounter: Payer: Self-pay | Admitting: Vascular Surgery

## 2017-01-03 ENCOUNTER — Ambulatory Visit (INDEPENDENT_AMBULATORY_CARE_PROVIDER_SITE_OTHER): Payer: Medicare HMO | Admitting: Vascular Surgery

## 2017-01-03 VITALS — BP 171/83 | HR 70 | Temp 97.4°F | Resp 16 | Ht 65.0 in | Wt 142.0 lb

## 2017-01-03 DIAGNOSIS — I779 Disorder of arteries and arterioles, unspecified: Secondary | ICD-10-CM

## 2017-01-03 NOTE — Progress Notes (Signed)
Patient ID: Sarah Moss, female   DOB: [birth year], 76 y.o.   MRN: [id number]  Reason for Consult: PAD (f/u)   Referred by Wenda Low, MD  Subjective:     HPI:  Sarah Moss is a 76 y.o. female previous history of bilateral iliac artery stenting.  She also has a remote history of right BKA from trauma 60 years ago.  Recent stenting was done for left foot wound which is now well-healed.  She also has a known occluded left SFA.  She recently had melanoma excised from her chest tolerated this well.  She is currently healing from that.  He has no lower extremity complaints at this time.  Past Medical History:  Diagnosis Date  . Asthma   . Atrial fibrillation ([place])   . CHF (congestive heart failure) ([place])   . COPD (chronic obstructive pulmonary disease) ([place])   . Dementia   . Diabetes mellitus without complication ([place])   . GERD (gastroesophageal reflux disease)   . Hx of right BKA ([place])   . Hyperlipidemia   . Hypertension   . Melanoma ([place])    chest wall  . Pacemaker   . PAD (peripheral artery disease) ([facility])    L SFA, RCIA, LCIA stents  . Sleep apnea    documented in pt history  . Tachycardia-bradycardia syndrome ([place])    Family History  Problem Relation Age of Onset  . Stroke Mother   . Diabetes Mother   . Cancer Father    Past Surgical History:  Procedure Laterality Date  . LOWER EXTREMITY ANGIOGRAPHY N/A 03/19/2016   Procedure: Lower Extremity Angiography;  Surgeon: Serafina Mitchell, MD;  Location: [place] [facility];  Service: Cardiovascular;  Laterality: N/A;  . MELANOMA EXCISION N/A 12/12/2016   Procedure: WIDE LOCAL EXCISION WITH ADVANCEMENT FLAP CLOSURE FOR CHEST WALL MELANOMA;  Surgeon: Stark Klein, MD;  Location: [place];  Service: General;  Laterality: N/A;  . PERIPHERAL VASCULAR INTERVENTION Bilateral 03/19/2016   Procedure: Peripheral Vascular Intervention;  Surgeon: Serafina Mitchell, MD;  Location: [place] [facility];  Service: Cardiovascular;   Laterality: Bilateral;  iliacs    Short Social History:  Social History   Tobacco Use  . Smoking status: Former Research scientist (life sciences)  . Smokeless tobacco: Never Used  Substance Use Topics  . Alcohol use: No    Allergies  Allergen Reactions  . Ambien [Zolpidem] Other (See Comments)    "Has the opposite effect; makes me more alert/hyper"  . Ativan [Lorazepam] Other (See Comments)    "Makes me confused and crazy"  . Codeine Itching and Nausea And Vomiting    Current Outpatient Medications  Medication Sig Dispense Refill  . ADVAIR DISKUS 250-50 MCG/DOSE AEPB Inhale 1 puff into the lungs 2 (two) times daily.    Marland Kitchen ALPRAZolam (XANAX) 0.25 MG tablet Take 1 tablet (0.25 mg total) by mouth every 12 (twelve) hours as needed for anxiety. (Patient taking differently: Take 0.25 mg by mouth every 8 (eight) hours as needed for anxiety. ) 10 tablet 0  . aspirin EC 81 MG EC tablet Take 1 tablet (81 mg total) by mouth daily.    Marland Kitchen atorvastatin (LIPITOR) 40 MG tablet Take 40 mg by mouth every evening.     . carvedilol (COREG) 12.5 MG tablet Take 1 tablet (12.5 mg total) by mouth 2 (two) times daily with a meal. 60 tablet 9  . DULoxetine (CYMBALTA) 30 MG capsule Take 30 mg by mouth daily at 8 pm.    .  furosemide (LASIX) 40 MG tablet Take 40 mg by mouth daily.    Marland Kitchen gabapentin (NEURONTIN) 300 MG capsule Take 300 mg by mouth at bedtime.    . iron polysaccharides (NIFEREX) 150 MG capsule Take 1 capsule (150 mg total) by mouth daily.    Marland Kitchen levothyroxine (SYNTHROID, LEVOTHROID) 25 MCG tablet Take 25 mcg by mouth daily.    Marland Kitchen lidocaine (LIDODERM) 5 % Place 1 patch onto the skin every 12 (twelve) hours. Remove & Discard patch within 12 hours or as directed by MD    . lisinopril (PRINIVIL,ZESTRIL) 20 MG tablet Take 20 mg by mouth daily.    . metFORMIN (GLUCOPHAGE) 1000 MG tablet Take 1,000 mg by mouth 2 (two) times daily.    . montelukast (SINGULAIR) 10 MG tablet Take 10 mg by mouth at bedtime.    . nitroGLYCERIN (NITROSTAT)  0.4 MG SL tablet Place 0.4 mg under the tongue every 5 (five) minutes as needed for chest pain.    Marland Kitchen NUTRITIONAL SUPPLEMENT LIQD Take 120 mLs by mouth 3 (three) times daily. Medpass    . omeprazole (PRILOSEC) 20 MG capsule Take 40 mg by mouth daily.    Marland Kitchen oxyCODONE-acetaminophen (PERCOCET/ROXICET) 5-325 MG tablet Take 1 tablet by mouth every 6 (six) hours as needed for severe pain. (Patient taking differently: Take 1 tablet by mouth every 4 (four) hours as needed for severe pain. ) 10 tablet 0  . oxymetazoline (AFRIN) 0.05 % nasal spray Place 2 sprays into both nostrils daily. May use an additional 2 spray in each nostril every hour as needed for dryness or nosebleed    . polyethylene glycol (MIRALAX / GLYCOLAX) packet Take 17 g by mouth 2 (two) times daily. 14 each 0  . senna-docusate (SENOKOT-S) 8.6-50 MG tablet Take 1 tablet by mouth 2 (two) times daily.    . Vitamin D, Ergocalciferol, (DRISDOL) 50000 units CAPS capsule Take 50,000 Units by mouth 2 (two) times a week.    . warfarin (COUMADIN) 5 MG tablet Take 5 mg by mouth every evening.    . enoxaparin (LOVENOX) 60 MG/0.6ML injection Inject 60 mg into the skin daily.     No current facility-administered medications for this visit.     Review of Systems  Constitutional:  Constitutional negative. HENT: HENT negative.  Eyes: Eyes negative.  Respiratory: Respiratory negative.  Cardiovascular: Cardiovascular negative.  GI: Gastrointestinal negative.  Musculoskeletal: Musculoskeletal negative.  Skin: Skin negative.  Neurological: Neurological negative. Hematologic: Hematologic/lymphatic negative.  Psychiatric: Psychiatric negative.        Objective:  Objective   Vitals:   01/03/17 1230 01/03/17 1240  BP: (!) 154/70 (!) 171/83  Pulse: 70 70  Resp: 16   Temp: (!) 97.4 F (36.3 C)   TempSrc: Oral   SpO2: 98%   Weight: 142 lb (64.4 kg)   Height: 5\' 5"  (1.651 m)    Body mass index is 23.63 kg/m.  Physical Exam  Constitutional:  She is oriented to person, place, and time. She appears well-developed.  Eyes: Pupils are equal, round, and reactive to light.  Neck: Normal range of motion.  Cardiovascular: Normal rate.  Pulses:      Femoral pulses are 0 on the right side, and 1+ on the left side. Pulmonary/Chest: Effort normal.  Abdominal: Soft.  Musculoskeletal: Normal range of motion. She exhibits no edema.  Well healed right bka and left foot wound  Neurological: She is alert and oriented to person, place, and time.  Skin: Skin is warm and  dry.  Psychiatric: She has a normal mood and affect. Her behavior is normal. Judgment and thought content normal.    Data: Iliac artery stent duplex demonstrates velocity of 409 distal to the stent on the right.  Left common iliac artery stent appears patent.     Assessment/Plan:     76 year old female follows up from recent duplex which demonstrated elevated velocities distal to her right common iliac artery stent.  Given that she has a BKA on the side that sometimes rubs on her prosthesis we will proceed with angiogram and possible stenting.  Her left common iliac artery stent appears patent and should not need intervention but her femoral pulse on the left does feel weak and she may need access there as well.  She demonstrates good understanding we will get her scheduled in the near.  She will need to have Lovenox bridge for the procedure.     Waynetta Sandy MD [facility]

## 2017-01-06 ENCOUNTER — Other Ambulatory Visit: Payer: Self-pay | Admitting: *Deleted

## 2017-01-10 ENCOUNTER — Telehealth: Payer: Self-pay | Admitting: Cardiology

## 2017-01-10 ENCOUNTER — Ambulatory Visit (INDEPENDENT_AMBULATORY_CARE_PROVIDER_SITE_OTHER): Payer: Medicare HMO | Admitting: *Deleted

## 2017-01-10 DIAGNOSIS — I495 Sick sinus syndrome: Secondary | ICD-10-CM | POA: Diagnosis not present

## 2017-01-10 NOTE — Progress Notes (Signed)
Remote pacemaker transmission.   

## 2017-01-10 NOTE — Telephone Encounter (Signed)
Confirmed remote transmission w/ pt daughter.   

## 2017-01-13 ENCOUNTER — Other Ambulatory Visit: Payer: Self-pay | Admitting: *Deleted

## 2017-01-13 ENCOUNTER — Encounter: Payer: Self-pay | Admitting: Cardiology

## 2017-01-27 ENCOUNTER — Telehealth: Payer: Self-pay | Admitting: *Deleted

## 2017-01-27 ENCOUNTER — Encounter: Payer: Self-pay | Admitting: *Deleted

## 2017-01-27 ENCOUNTER — Other Ambulatory Visit: Payer: Self-pay | Admitting: *Deleted

## 2017-01-27 NOTE — Telephone Encounter (Signed)
Per Elmyra Ricks at Dr. Glenna Durand office: Nursing staff at [facility] will obtain medication clearance with Coumadin  and obtain orders from Dr. Lysle Rubens. Letter with pre-procedure instructions and Medication clearance faxed to [place] at [facility] and called. She confirmed receiving fax. Called patient's daughter and confirmed time and date of procedure with her.

## 2017-01-28 LAB — CUP PACEART REMOTE DEVICE CHECK
Brady Statistic RV Percent Paced: 99 %
Date Time Interrogation Session: 20181228113112
Implantable Lead Implant Date: 20161205
Implantable Lead Location: 753859
Lead Channel Pacing Threshold Amplitude: 0.75 V
Lead Channel Sensing Intrinsic Amplitude: 12 mV
Lead Channel Setting Pacing Amplitude: 2.5 V
MDC IDC LEAD IMPLANT DT: 20161205
MDC IDC LEAD LOCATION: 753860
MDC IDC MSMT BATTERY REMAINING LONGEVITY: 115 mo
MDC IDC MSMT BATTERY REMAINING PERCENTAGE: 95.5 %
MDC IDC MSMT BATTERY VOLTAGE: 2.99 V
MDC IDC MSMT LEADCHNL RV IMPEDANCE VALUE: 440 Ohm
MDC IDC MSMT LEADCHNL RV PACING THRESHOLD PULSEWIDTH: 0.4 ms
MDC IDC PG IMPLANT DT: 20161205
MDC IDC PG SERIAL: 7843315
MDC IDC SET LEADCHNL RV PACING PULSEWIDTH: 0.4 ms
MDC IDC SET LEADCHNL RV SENSING SENSITIVITY: 4 mV

## 2017-02-06 ENCOUNTER — Ambulatory Visit (HOSPITAL_COMMUNITY)
Admission: RE | Admit: 2017-02-06 | Discharge: 2017-02-06 | Disposition: A | Discharge status: Home / Self Care | Payer: Medicare HMO | Source: Ambulatory Visit | Attending: Vascular Surgery | Admitting: Vascular Surgery

## 2017-02-06 ENCOUNTER — Encounter (HOSPITAL_COMMUNITY)
Admission: RE | Disposition: A | Discharge status: Home / Self Care | Payer: Self-pay | Source: Ambulatory Visit | Attending: Vascular Surgery

## 2017-02-06 ENCOUNTER — Telehealth: Payer: Self-pay | Admitting: Vascular Surgery

## 2017-02-06 DIAGNOSIS — Z950 Presence of cardiac pacemaker: Secondary | ICD-10-CM | POA: Insufficient documentation

## 2017-02-06 DIAGNOSIS — Z87891 Personal history of nicotine dependence: Secondary | ICD-10-CM | POA: Insufficient documentation

## 2017-02-06 DIAGNOSIS — I4891 Unspecified atrial fibrillation: Secondary | ICD-10-CM | POA: Insufficient documentation

## 2017-02-06 DIAGNOSIS — G4730 Sleep apnea, unspecified: Secondary | ICD-10-CM | POA: Insufficient documentation

## 2017-02-06 DIAGNOSIS — I509 Heart failure, unspecified: Secondary | ICD-10-CM | POA: Diagnosis not present

## 2017-02-06 DIAGNOSIS — K219 Gastro-esophageal reflux disease without esophagitis: Secondary | ICD-10-CM | POA: Diagnosis not present

## 2017-02-06 DIAGNOSIS — Z89511 Acquired absence of right leg below knee: Secondary | ICD-10-CM | POA: Diagnosis not present

## 2017-02-06 DIAGNOSIS — Z85820 Personal history of malignant melanoma of skin: Secondary | ICD-10-CM | POA: Insufficient documentation

## 2017-02-06 DIAGNOSIS — I708 Atherosclerosis of other arteries: Secondary | ICD-10-CM | POA: Diagnosis present

## 2017-02-06 DIAGNOSIS — I110 Hypertensive heart disease with heart failure: Secondary | ICD-10-CM | POA: Diagnosis not present

## 2017-02-06 DIAGNOSIS — F0390 Unspecified dementia without behavioral disturbance: Secondary | ICD-10-CM | POA: Diagnosis not present

## 2017-02-06 DIAGNOSIS — E785 Hyperlipidemia, unspecified: Secondary | ICD-10-CM | POA: Diagnosis not present

## 2017-02-06 DIAGNOSIS — I70229 Atherosclerosis of native arteries of extremities with rest pain, unspecified extremity: Secondary | ICD-10-CM | POA: Diagnosis present

## 2017-02-06 DIAGNOSIS — E1151 Type 2 diabetes mellitus with diabetic peripheral angiopathy without gangrene: Secondary | ICD-10-CM | POA: Insufficient documentation

## 2017-02-06 DIAGNOSIS — J449 Chronic obstructive pulmonary disease, unspecified: Secondary | ICD-10-CM | POA: Insufficient documentation

## 2017-02-06 DIAGNOSIS — I998 Other disorder of circulatory system: Secondary | ICD-10-CM | POA: Diagnosis present

## 2017-02-06 DIAGNOSIS — I771 Stricture of artery: Secondary | ICD-10-CM | POA: Diagnosis not present

## 2017-02-06 HISTORY — PX: ABDOMINAL AORTOGRAM W/LOWER EXTREMITY: CATH118223

## 2017-02-06 HISTORY — PX: PERIPHERAL VASCULAR INTERVENTION: CATH118257

## 2017-02-06 LAB — PROTIME-INR
INR: 1
PROTHROMBIN TIME: 13.1 s (ref 11.4–15.2)

## 2017-02-06 LAB — POCT ACTIVATED CLOTTING TIME
ACTIVATED CLOTTING TIME: 285 s
Activated Clotting Time: 235 seconds
Activated Clotting Time: 202 seconds
Activated Clotting Time: 180 seconds

## 2017-02-06 LAB — POCT I-STAT, CHEM 8
BUN: 20 mg/dL (ref 6–20)
CALCIUM ION: 1.16 mmol/L (ref 1.15–1.40)
CHLORIDE: 104 mmol/L (ref 101–111)
Creatinine, Ser: 0.9 mg/dL (ref 0.44–1.00)
Glucose, Bld: 130 mg/dL — ABNORMAL HIGH (ref 65–99)
HCT: 38 % (ref 36.0–46.0)
HEMOGLOBIN: 12.9 g/dL (ref 12.0–15.0)
Potassium: 4.1 mmol/L (ref 3.5–5.1)
SODIUM: 142 mmol/L (ref 135–145)
TCO2: 26 mmol/L (ref 22–32)

## 2017-02-06 LAB — GLUCOSE, CAPILLARY: GLUCOSE-CAPILLARY: 116 mg/dL — AB (ref 65–99)

## 2017-02-06 SURGERY — Surgical Case
Anesthesia: *Unknown

## 2017-02-06 SURGERY — ABDOMINAL AORTOGRAM W/LOWER EXTREMITY
Anesthesia: LOCAL | Laterality: Right

## 2017-02-06 MED ORDER — LABETALOL HCL 5 MG/ML IV SOLN
INTRAVENOUS | Status: AC
  Filled 2017-02-06: qty 4

## 2017-02-06 MED ORDER — HEPARIN SODIUM (PORCINE) 1000 UNIT/ML IJ SOLN
INTRAMUSCULAR | Status: DC | PRN
  Administered 2017-02-06: 8000 [IU] via INTRAVENOUS

## 2017-02-06 MED ORDER — IODIXANOL 320 MG/ML IV SOLN
INTRAVENOUS | Status: DC | PRN
  Administered 2017-02-06: 80 mL via INTRA_ARTERIAL

## 2017-02-06 MED ORDER — HYDRALAZINE HCL 20 MG/ML IJ SOLN
INTRAMUSCULAR | Status: DC | PRN
  Administered 2017-02-06: 10 mg via INTRAVENOUS

## 2017-02-06 MED ORDER — HYDRALAZINE HCL 20 MG/ML IJ SOLN: 5.0000 mg | INTRAMUSCULAR | Status: DC | PRN

## 2017-02-06 MED ORDER — FENTANYL CITRATE (PF) 100 MCG/2ML IJ SOLN
INTRAMUSCULAR | Status: DC | PRN
  Administered 2017-02-06: 25 ug via INTRAVENOUS
  Administered 2017-02-06: 50 ug via INTRAVENOUS

## 2017-02-06 MED ORDER — SODIUM CHLORIDE 0.9 % WEIGHT BASED INFUSION: 1.0000 mL/kg/h | INTRAVENOUS | Status: DC

## 2017-02-06 MED ORDER — MIDAZOLAM HCL 2 MG/2ML IJ SOLN
INTRAMUSCULAR | Status: AC
  Filled 2017-02-06: qty 2

## 2017-02-06 MED ORDER — HEPARIN SODIUM (PORCINE) 1000 UNIT/ML IJ SOLN
INTRAMUSCULAR | Status: AC
  Filled 2017-02-06: qty 1

## 2017-02-06 MED ORDER — MIDAZOLAM HCL 2 MG/2ML IJ SOLN
INTRAMUSCULAR | Status: DC | PRN
  Administered 2017-02-06: 1 mg via INTRAVENOUS

## 2017-02-06 MED ORDER — LIDOCAINE HCL (PF) 1 % IJ SOLN
INTRAMUSCULAR | Status: DC | PRN
  Administered 2017-02-06: 10 mL

## 2017-02-06 MED ORDER — OXYCODONE-ACETAMINOPHEN 5-325 MG PO TABS
ORAL_TABLET | ORAL | Status: AC
  Filled 2017-02-06: qty 1

## 2017-02-06 MED ORDER — LABETALOL HCL 5 MG/ML IV SOLN
10.0000 mg | INTRAVENOUS | Status: DC | PRN
  Administered 2017-02-06: 10 mg via INTRAVENOUS

## 2017-02-06 MED ORDER — HEPARIN (PORCINE) IN NACL 2-0.9 UNIT/ML-% IJ SOLN
INTRAMUSCULAR | Status: AC
  Filled 2017-02-06: qty 1000

## 2017-02-06 MED ORDER — SODIUM CHLORIDE 0.9 % IV SOLN
INTRAVENOUS | Status: DC
  Administered 2017-02-06: 09:00:00 via INTRAVENOUS

## 2017-02-06 MED ORDER — FENTANYL CITRATE (PF) 100 MCG/2ML IJ SOLN
INTRAMUSCULAR | Status: AC
  Filled 2017-02-06: qty 2

## 2017-02-06 MED ORDER — HYDRALAZINE HCL 20 MG/ML IJ SOLN
INTRAMUSCULAR | Status: AC
  Filled 2017-02-06: qty 1

## 2017-02-06 MED ORDER — OXYCODONE-ACETAMINOPHEN 5-325 MG PO TABS
1.0000 | ORAL_TABLET | ORAL | Status: DC | PRN
  Administered 2017-02-06: 1 via ORAL

## 2017-02-06 MED ORDER — LIDOCAINE HCL 1 % IJ SOLN
INTRAMUSCULAR | Status: AC
  Filled 2017-02-06: qty 40

## 2017-02-06 SURGICAL SUPPLY — 20 items
BALLN MUSTANG 6X60X75 (BALLOONS) ×3
BALLOON MUSTANG 6X60X75 (BALLOONS) IMPLANT
CATH ANGIO 5F BER2 65CM (CATHETERS) ×1 IMPLANT
CATH OMNI FLUSH 5F 65CM (CATHETERS) ×1 IMPLANT
COVER PRB 48X5XTLSCP FOLD TPE (BAG) ×1 IMPLANT
COVER PROBE 5X48 (BAG) ×3
KIT ENCORE 26 ADVANTAGE (KITS) ×3 IMPLANT
KIT MICROINTRODUCER STIFF 5F (SHEATH) ×1 IMPLANT
KIT PV (KITS) ×3 IMPLANT
SHEATH BRITE TIP 7FR 35CM (SHEATH) ×2 IMPLANT
SHEATH PINNACLE 5F 10CM (SHEATH) ×1 IMPLANT
STENT ABSOLUTE PRO 6X60X135 (Permanent Stent) ×2 IMPLANT
SYR MEDRAD MARK V 150ML (SYRINGE) ×3 IMPLANT
TAPE VIPERTRACK RADIOPAQ (MISCELLANEOUS) IMPLANT
TAPE VIPERTRACK RADIOPAQUE (MISCELLANEOUS) ×3
TRANSDUCER W/STOPCOCK (MISCELLANEOUS) ×3 IMPLANT
TRAY PV CATH (CUSTOM PROCEDURE TRAY) ×3 IMPLANT
WIRE AMPLATZ SS-J .035X180CM (WIRE) ×1 IMPLANT
WIRE BENTSON .035X145CM (WIRE) ×1 IMPLANT
WIRE ROSEN-J .035X260CM (WIRE) ×1 IMPLANT

## 2017-02-06 NOTE — Progress Notes (Signed)
Site area: Right  Groin 7 french arterial sheath was removed  Site Prior to Removal:  Level 0  Pressure Applied For 25 MINUTES    Bedrest Beginning at 1420p  Manual:   Yes.    Patient Status During Pull:  status  Post Pull Groin Site:  Level 0  Post Pull Instructions Given:  Yes.    Post Pull Pulses Present:  Yes.    Dressing Applied:  Yes.    Comments:  Stable during sheath pull

## 2017-02-06 NOTE — H&P (Addendum)
Brief History and Physical  History of Present Illness   Sarah Moss is a 77 y.o. female who presents with chief complaint: R iliac artery stenosis.  The patient presents today for Aortogram, bilateral pelvic angiogram, possible intervention.    Past Medical History:  Diagnosis Date  . Asthma   . Atrial fibrillation ([place])   . CHF (congestive heart failure) ([place])   . COPD (chronic obstructive pulmonary disease) ([place])   . Dementia   . Diabetes mellitus without complication ([place])   . GERD (gastroesophageal reflux disease)   . Hx of right BKA ([place])   . Hyperlipidemia   . Hypertension   . Melanoma ([place])    chest wall  . Pacemaker   . PAD (peripheral artery disease) ([facility])    L SFA, RCIA, LCIA stents  . Sleep apnea    documented in pt history  . Tachycardia-bradycardia syndrome [facility])     Past Surgical History:  Procedure Laterality Date  . LOWER EXTREMITY ANGIOGRAPHY N/A 03/19/2016   Procedure: Lower Extremity Angiography;  Surgeon: Serafina Mitchell, MD;  Location: [place] [facility];  Service: Cardiovascular;  Laterality: N/A;  . MELANOMA EXCISION N/A 12/12/2016   Procedure: WIDE LOCAL EXCISION WITH ADVANCEMENT FLAP CLOSURE FOR CHEST WALL MELANOMA;  Surgeon: Stark Klein, MD;  Location: [place];  Service: General;  Laterality: N/A;  . PERIPHERAL VASCULAR INTERVENTION Bilateral 03/19/2016   Procedure: Peripheral Vascular Intervention;  Surgeon: Serafina Mitchell, MD;  Location: [place] [facility];  Service: Cardiovascular;  Laterality: Bilateral;  iliacs    Social History   Socioeconomic History  . Marital status: Married    Spouse name: Not on file  . Number of children: Not on file  . Years of education: Not on file  . Highest education level: Not on file  Social Needs  . Financial resource strain: Not on file  . Food insecurity - worry: Not on file  . Food insecurity - inability: Not on file  . Transportation needs - medical: Not on file  . Transportation needs -  non-medical: Not on file  Occupational History  . Not on file  Tobacco Use  . Smoking status: Former Research scientist (life sciences)  . Smokeless tobacco: Never Used  Substance and Sexual Activity  . Alcohol use: No  . Drug use: No  . Sexual activity: Not on file  Other Topics Concern  . Not on file  Social History Narrative  . Not on file    Family History  Problem Relation Age of Onset  . Stroke Mother   . Diabetes Mother   . Cancer Father     No current facility-administered medications for this encounter.     Allergies  Allergen Reactions  . Ambien [Zolpidem] Other (See Comments)    "Has the opposite effect; makes me more alert/hyper"  . Ativan [Lorazepam] Other (See Comments)    "Makes me confused and crazy"  . Codeine Itching and Nausea And Vomiting    Review of Systems: As listed above, otherwise negative.   Physical Examination   Vitals:   02/06/17 0840  BP: (!) 180/79  Pulse: 70  Temp: 97.8 F (36.6 C)  TempSrc: Oral  SpO2: 96%  Weight: 155 lb (70.3 kg)  Height: 5\' 5"  (1.651 m)     General Alert, O x 3, WD, NAD  Pulmonary Sym exp, good B air movt, CTA B  Cardiac RRR, Nl S1, S2, no Murmurs, No rubs, No S3,S4  Musculo- skeletal R  BKA  Neurologic Pain and light touch intact in extremities,     Laboratory  See iStat   Medical Decision Making   Sarah Moss is a 77 y.o. female who presents with: possible iliac stenosis (R>L).   The patient is scheduled for: Aortogram, bilateral pelvic angiogram, possible intervention I discussed with the patient the nature of angiographic procedures, especially the limited patencies of any endovascular intervention.  The patient is aware of that the risks of an angiographic procedure include but are not limited to: bleeding, infection, access site complications, renal failure, embolization, rupture of vessel, dissection, possible need for emergent surgical intervention, possible need for surgical procedures to treat the patient's  pathology, and stroke and death.    The patient is aware of the risks and agrees to proceed.   Adele Barthel, MD, FACS [facility] [place] Office: [phone] Pager: [phone]  02/06/2017, 8:38 AM

## 2017-02-06 NOTE — Discharge Instructions (Signed)
RESTART COUMADIN 02/06/17 RESTART METFORMIN 02/09/17 Angiogram, Care After This sheet gives you information about how to care for yourself after your procedure. Your health care provider may also give you more specific instructions. If you have problems or questions, contact your health care provider. What can I expect after the procedure? After the procedure, it is common to have bruising and tenderness at the catheter insertion area. Follow these instructions at home: Insertion site care  Follow instructions from your health care provider about how to take care of your insertion site. Make sure you: ? Wash your hands with soap and water before you change your bandage (dressing). If soap and water are not available, use hand sanitizer. ? Change your dressing as told by your health care provider. ? Leave stitches (sutures), skin glue, or adhesive strips in place. These skin closures may need to stay in place for 2 weeks or longer. If adhesive strip edges start to loosen and curl up, you may trim the loose edges. Do not remove adhesive strips completely unless your health care provider tells you to do that.  Do not take baths, swim, or use a hot tub until your health care provider approves.  You may shower 24-48 hours after the procedure or as told by your health care provider. ? Gently wash the site with plain soap and water. ? Pat the area dry with a clean towel. ? Do not rub the site. This may cause bleeding.  Do not apply powder or lotion to the site. Keep the site clean and dry.  Check your insertion site every day for signs of infection. Check for: ? Redness, swelling, or pain. ? Fluid or blood. ? Warmth. ? Pus or a bad smell. Activity  Rest as told by your health care provider, usually for 1-2 days.  Do not lift anything that is heavier than 10 lbs. (4.5 kg) or as told by your health care provider.  Do not drive for 24 hours if you were given a medicine to help you relax  (sedative).  Do not drive or use heavy machinery while taking prescription pain medicine. General instructions  Return to your normal activities as told by your health care provider, usually in about a week. Ask your health care provider what activities are safe for you.  If the catheter site starts bleeding, lie flat and put pressure on the site. If the bleeding does not stop, get help right away. This is a medical emergency.  Drink enough fluid to keep your urine clear or pale yellow. This helps flush the contrast dye from your body.  Take over-the-counter and prescription medicines only as told by your health care provider.  Keep all follow-up visits as told by your health care provider. This is important. Contact a health care provider if:  You have a fever or chills.  You have redness, swelling, or pain around your insertion site.  You have fluid or blood coming from your insertion site.  The insertion site feels warm to the touch.  You have pus or a bad smell coming from your insertion site.  You have bruising around the insertion site.  You notice blood collecting in the tissue around the catheter site (hematoma). The hematoma may be painful to the touch. Get help right away if:  You have severe pain at the catheter insertion area.  The catheter insertion area swells very fast.  The catheter insertion area is bleeding, and the bleeding does not stop when you hold  steady pressure on the area.  The area near or just beyond the catheter insertion site becomes pale, cool, tingly, or numb. These symptoms may represent a serious problem that is an emergency. Do not wait to see if the symptoms will go away. Get medical help right away. Call your local emergency services (911 in the [place] Do not drive yourself to the hospital. Summary  After the procedure, it is common to have bruising and tenderness at the catheter insertion area.  After the procedure, it is important to  rest and drink plenty of fluids.  Do not take baths, swim, or use a hot tub until your health care provider says it is okay to do so. You may shower 24-48 hours after the procedure or as told by your health care provider.  If the catheter site starts bleeding, lie flat and put pressure on the site. If the bleeding does not stop, get help right away. This is a medical emergency. This information is not intended to replace advice given to you by your health care provider. Make sure you discuss any questions you have with your health care provider. Document Released: 07/19/2004 Document Revised: 12/06/2015 Document Reviewed: 12/06/2015 Elsevier Interactive Patient Education  [employer].

## 2017-02-06 NOTE — Telephone Encounter (Signed)
Sched appt 03/14/17 at 10:00. Lm on cell# to inform pt of appt.

## 2017-02-06 NOTE — Progress Notes (Addendum)
Pt on coumadin but has been on hold for procedure, no communication to pt/ family/ nurse as when to restart, Dr Bridgett Larsson paged. Dr Bridgett Larsson was then paged again, Pa for Dr Bridgett Larsson // Catalina [place]igua was paged, no call back. Reached by cell phone [phone]

## 2017-02-06 NOTE — Op Note (Signed)
OPERATIVE NOTE   PROCEDURE: 1.  Right common femoral artery cannulation under ultrasound guidance 2.  Placement of catheter in aorta 3.  Aortogram 4.  Conscious sedation for 36 minutes 5.  Limited bilateral leg runoff (Bilateral pelvic angiogram) 6.  Right external iliac artery angioplasty and stenting (6 mm x 60 mm)   PRE-OPERATIVE DIAGNOSIS: right external iliac artery high grade stenosis  POST-OPERATIVE DIAGNOSIS: same as above   SURGEON: Adele Barthel, MD  ANESTHESIA: conscious sedation  ESTIMATED BLOOD LOSS: 30 cc  CONTRAST: 80 cc  FINDING(S):  Aorta: patent   Right Left  RA patent Patent, >50% stenosis  CIA Patent stent, patent otherwise Patent stent, patent otherwise  EIA Patent with focal mid-segment stenosis >75%, possible prior dissection: resolved with stenting Patent  IIA Patent Patent  CFA Proximal patent and small Proximal patent and small   SPECIMEN(S):  none  INDICATIONS:   Sarah Moss is a 77 y.o. female who presents with right external iliac artery stenosis on non-invasive studies of the aortoiliac segments.  The patient presents for: aortogram, bilateral pelvic angiogram and possible intervention.  I discussed with the patient the nature of angiographic procedures, especially the limited patencies of any endovascular intervention.  The patient is aware of that the risks of an angiographic procedure include but are not limited to: bleeding, infection, access site complications, renal failure, embolization, rupture of vessel, dissection, possible need for emergent surgical intervention, possible need for surgical procedures to treat the patient's pathology, and stroke and death.  The patient is aware of the risks and agrees to proceed.  DESCRIPTION: After full informed consent was obtained from the patient, the patient was brought back to the angiography suite.  The patient was placed supine upon the angiography table and connected to cardiopulmonary  monitoring equipment.  The patient was then given conscious sedation, the amounts of which are documented in the patient's chart.  A circulating radiologic technician maintained continuous monitoring of the patient's cardiopulmonary status.  Additionally, the control room radiologic technician provided backup monitoring throughout the procedure.  The patient was prepped and drape in the standard fashion for an angiographic procedure.  At this point, attention was turned to the right groin.  Under ultrasound guidance, the subcutaneous tissue surrounding the right common femoral artery was anesthesized with 1% lidocaine with epinephrine.  The artery was then cannulated with a micropuncture needle.  Note there was significant resistance in the tissue surrounding the artery. The microwire was advanced into the iliac arterial system.  The needle was exchanged for a microsheath, which was loaded into the common femoral artery over the wire.  The microwire was exchanged for a Bentson wire which was advanced into the external iliac artery where it would not advance.  This was enough to allow sheath exchange.  The microsheath was then exchanged for a 5-Fr sheath which was loaded into the common femoral artery.  I loaded a BER-2 catheter over this wire and used it to get into the external iliac artery.  There was difficulty getting the Bentson more proximal so I did a hand injections which suggested a possible chronic dissection in the external iliac artery vs. calcific plaque.  Using this wire-catheter combination, I was eventually able to get past the dissection vs. Stenosis, getting into the aorta.  The catheter was exchanged for the Omniflush catheter.  The Omniflush catheter was then loaded over the wire up to the level of L1.  The catheter was connected to the power injector  circuit.  After de-airring and de-clotting the circuit, a power injector aortogram was completed.    I pulled the catheter down into the  distal aorta with some difficulty due to the small distal aorta.  A magnified 30 degree LAO projection was completed to image the right iliac arterial system.  A Rosen wire was placed through the [facility] catheter.  The catheter was removed.   The right sheath was exchanged for a long Brite-tip 7-Fr sheath, which was lodged proximal to the external iliac artery stenosis.  The patient was given 8000 units of Heparin intravenously, which was a therapeutic bolus. Based on measurements, I selected a 6 mm x 60 mm self-expanding stent.  This was inserted through the sheath and then the sheath pulled back.  I deployed the stent just distal to the right internal iliac artery.  I removed the stent delivery system and then placed a 6 mm x 60 mm angioplasty balloon into the stent.  The balloon was centered on the stent and inflated to 10 ATM for 30 sec.  I also tapped each end of the stent to get full expansion.    I did a completion injection which demonstrated resolution of the possible dissection vs stenosis.  The sheath was no longer flow restricting.  The right sheath will be removed in holding once anticoagulation reverses.  The sheath was aspirated.  No clots were present and the sheath was reloaded with heparinized saline.     COMPLICATIONS: none  CONDITION: stable   Adele Barthel, MD, [facility] [place] Office: [phone] Pager: [phone]  02/06/2017, 11:00 AM

## 2017-02-06 NOTE — Telephone Encounter (Signed)
-----   Message from Mena Goes, RN sent at 02/06/2017  2:53 PM EST ----- Regarding: 4 weeks   ----- Message ----- From: Conrad , MD Sent: 02/06/2017  11:18 AM To: Vvs Charge Pool  SHAMELL HITTLE [id number] 05-09-1940  PROCEDURE: 1.  Right common femoral artery cannulation under ultrasound guidance 2.  Placement of catheter in aorta 3.  Aortogram 4.  Conscious sedation for 36 minutes 5.  Limited bilateral leg runoff (Bilateral pelvic angiogram) 6.  Right external iliac artery angioplasty and stenting (6 mm x 60 mm)  Follow-up: Dr. Donzetta Matters (4 weeks)

## 2017-02-07 ENCOUNTER — Encounter (HOSPITAL_COMMUNITY): Payer: Self-pay | Admitting: Vascular Surgery

## 2017-02-07 MED FILL — Heparin Sodium (Porcine) 2 Unit/ML in Sodium Chloride 0.9%: INTRAMUSCULAR | Qty: 1000 | Status: AC

## 2017-02-07 MED FILL — Lidocaine HCl Local Inj 1%: INTRAMUSCULAR | Qty: 20 | Status: AC

## 2017-03-06 ENCOUNTER — Ambulatory Visit (INDEPENDENT_AMBULATORY_CARE_PROVIDER_SITE_OTHER): Payer: Medicare HMO | Admitting: Cardiology

## 2017-03-06 ENCOUNTER — Encounter: Payer: Self-pay | Admitting: Cardiology

## 2017-03-06 VITALS — BP 130/82 | HR 76 | Ht 65.0 in

## 2017-03-06 DIAGNOSIS — Z95828 Presence of other vascular implants and grafts: Secondary | ICD-10-CM | POA: Diagnosis not present

## 2017-03-06 DIAGNOSIS — I739 Peripheral vascular disease, unspecified: Secondary | ICD-10-CM | POA: Diagnosis not present

## 2017-03-06 DIAGNOSIS — E785 Hyperlipidemia, unspecified: Secondary | ICD-10-CM

## 2017-03-06 DIAGNOSIS — I495 Sick sinus syndrome: Secondary | ICD-10-CM | POA: Diagnosis not present

## 2017-03-06 DIAGNOSIS — I482 Chronic atrial fibrillation: Secondary | ICD-10-CM

## 2017-03-06 DIAGNOSIS — Z79899 Other long term (current) drug therapy: Secondary | ICD-10-CM

## 2017-03-06 DIAGNOSIS — I4821 Permanent atrial fibrillation: Secondary | ICD-10-CM

## 2017-03-06 NOTE — Progress Notes (Addendum)
Cardiology Office Note:    Date:  03/06/2017   ID:  Sarah Moss, DOB 08-05-1940, MRN [id number]  PCP:  Wenda Low, MD  Cardiologist:  Candee Furbish, MD     Referring MD: Wenda Low, MD    History of Present Illness:    Sarah Moss is a 77 y.o. female with a hx of St. Jude pacemaker placement in December 2015 with right BKA from accident, peripheral arterial disease status post left superficial femoral artery stenting, diabetes with hypertension, atrial fibrillation with tachycardia-bradycardia syndrome on chronic anticoagulation with Coumadin here for post hospital follow-up after being admitted with left ventricular dysfunction, shortness of breath, acute systolic heart failure.  In review of my consult note from the hospital on 03/22/16 she received care from Dr. [name] in [facility]. She was recently discharged from [facility] on December 2017 for CHF exacerbation. Prior to that in September 2017 her echocardiogram showed EF of 50-55% with moderate mitral regurgitation and tricuspid regurgitation with moderate pulmonary hypertension.  She was seen by vascular surgery, Dr. Trula Slade, ABI suggested significant PVD and she underwent peripheral vascular angiogram with stent to her left common iliac artery and right common iliac artery. An echo was performed and demonstrated EF with apparently new ejection fraction of 20%. At that time, she was unable to give me any details of past medical history, describing "how did I get here ". She was not oriented. She did however deny any dyspnea or chest pain.  She was seen by Rosaria Ferries here in our office on 04/02/16 posthospitalization she was getting physical therapy at the time. She was having dyspnea on exertion at that time unable to get to the bedroom to bathroom without shortness of breath. [facility].  By history, he was also noted that she has a single kidney and stent placed in 2006 by Dr. early. Her Lasix was increased to 40  mg twice a day to 80 mg a.m. 40 mg p.m. for 3 days after that visit.  She also saw Dr. Barry Dienes of general surgery. She had a melanoma excised from her mid chest wall and they would like to go back in to take out more tissue. Please see below.  06/27/16-she is done much better after diuresis. Lost approximate 32 pounds. She had an very aggressive diuresis with metolazone. Be careful. She is now off this medication. Continue with Lasix once a day. Very happy with results. Breathing well. Able to get prosthesis back on.  03/06/17-overall she has been doing quite well without any anginal symptoms, no significant shortness of breath.  Right BKA noted.  She has been able to maintain her weight fairly well.  She has not had any difficulty with putting on her prosthesis.  In the past due to edema this was an issue.  Past Medical History:  Diagnosis Date  . Asthma   . Atrial fibrillation ([place])   . CHF (congestive heart failure) ([place])   . COPD (chronic obstructive pulmonary disease) ([place])   . Dementia   . Diabetes mellitus without complication ([place])   . GERD (gastroesophageal reflux disease)   . Hx of right BKA ([place])   . Hyperlipidemia   . Hypertension   . Melanoma ([place])    chest wall  . Pacemaker   . PAD (peripheral artery disease) (HCC)    L SFA, RCIA, LCIA stents  . Sleep apnea    documented in pt history  . Tachycardia-bradycardia syndrome [facility])     Past Surgical History:  Procedure Laterality Date  . ABDOMINAL AORTOGRAM W/LOWER EXTREMITY N/A 02/06/2017   Procedure: ABDOMINAL AORTOGRAM W/LOWER EXTREMITY;  Surgeon: Conrad Kendrick, MD;  Location: [place] CV [facility];  Service: Cardiovascular;  Laterality: N/A;  . LOWER EXTREMITY ANGIOGRAPHY N/A 03/19/2016   Procedure: Lower Extremity Angiography;  Surgeon: Serafina Mitchell, MD;  Location: [place] CV LAB;  Service: Cardiovascular;  Laterality: N/A;  . MELANOMA EXCISION N/A 12/12/2016   Procedure: WIDE LOCAL EXCISION WITH ADVANCEMENT FLAP  CLOSURE FOR CHEST WALL MELANOMA;  Surgeon: Stark Klein, MD;  Location: [place];  Service: General;  Laterality: N/A;  . PERIPHERAL VASCULAR INTERVENTION Bilateral 03/19/2016   Procedure: Peripheral Vascular Intervention;  Surgeon: Serafina Mitchell, MD;  Location: [place] CV LAB;  Service: Cardiovascular;  Laterality: Bilateral;  iliacs  . PERIPHERAL VASCULAR INTERVENTION Right 02/06/2017   Procedure: PERIPHERAL VASCULAR INTERVENTION;  Surgeon: Conrad [place] Point, MD;  Location: [place] CV LAB;  Service: Cardiovascular;  Laterality: Right;  external iliac    Current Medications: Current Meds  Medication Sig  . ADVAIR DISKUS 250-50 MCG/DOSE AEPB Inhale 1 puff into the lungs 2 (two) times daily.  Marland Kitchen aspirin EC 81 MG EC tablet Take 1 tablet (81 mg total) by mouth daily.  Marland Kitchen atorvastatin (LIPITOR) 40 MG tablet Take 40 mg by mouth every evening.   . carvedilol (COREG) 12.5 MG tablet Take 1 tablet (12.5 mg total) by mouth 2 (two) times daily with a meal.  . DULoxetine (CYMBALTA) 60 MG capsule Take 60 mg by mouth daily.  . furosemide (LASIX) 40 MG tablet Take 40 mg by mouth daily.  Marland Kitchen gabapentin (NEURONTIN) 300 MG capsule Take 300 mg by mouth at bedtime.  . iron polysaccharides (NIFEREX) 150 MG capsule Take 1 capsule (150 mg total) by mouth daily.  Marland Kitchen levothyroxine (SYNTHROID, LEVOTHROID) 25 MCG tablet Take 25 mcg by mouth daily.  Marland Kitchen lidocaine (LIDODERM) 5 % Place 1 patch onto the skin every 12 (twelve) hours. Remove & Discard patch within 12 hours or as directed by MD  . lisinopril (PRINIVIL,ZESTRIL) 20 MG tablet Take 20 mg by mouth daily.  . metFORMIN (GLUCOPHAGE) 1000 MG tablet Take 1,000 mg by mouth 2 (two) times daily.  . montelukast (SINGULAIR) 10 MG tablet Take 10 mg by mouth at bedtime.  . nitroGLYCERIN (NITROSTAT) 0.4 MG SL tablet Place 0.4 mg under the tongue every 5 (five) minutes as needed for chest pain.  Marland Kitchen omeprazole (PRILOSEC) 40 MG capsule Take 40 mg by mouth daily.  Marland Kitchen  oxyCODONE-acetaminophen (PERCOCET/ROXICET) 5-325 MG tablet Take 1 tablet by mouth every 6 (six) hours as needed for severe pain. (Patient taking differently: Take 1 tablet by mouth every 4 (four) hours as needed for severe pain. )  . oxymetazoline (AFRIN) 0.05 % nasal spray Place 2 sprays into both nostrils daily. May use an additional 2 spray in each nostril every hour as needed for dryness or nosebleed  . polyethylene glycol (MIRALAX / GLYCOLAX) packet Take 17 g by mouth 2 (two) times daily.  Marland Kitchen senna-docusate (SENOKOT-S) 8.6-50 MG tablet Take 1 tablet by mouth 2 (two) times daily.  . traZODone (DESYREL) 50 MG tablet Take 50 mg by mouth at bedtime.  . Vitamin D, Ergocalciferol, (DRISDOL) 50000 units CAPS capsule Take 50,000 Units by mouth 2 (two) times a week.  . warfarin (COUMADIN) 5 MG tablet Take 5 mg by mouth every evening.     Allergies:   Ambien [zolpidem]; Ativan [lorazepam]; and Codeine   Social History  Socioeconomic History  . Marital status: Married    Spouse name: None  . Number of children: None  . Years of education: None  . Highest education level: None  Social Needs  . Financial resource strain: None  . Food insecurity - worry: None  . Food insecurity - inability: None  . Transportation needs - medical: None  . Transportation needs - non-medical: None  Occupational History  . None  Tobacco Use  . Smoking status: Former Research scientist (life sciences)  . Smokeless tobacco: Never Used  Substance and Sexual Activity  . Alcohol use: No  . Drug use: No  . Sexual activity: None  Other Topics Concern  . None  Social History Narrative  . None     family history includes Cancer in her father; Diabetes in her mother; Stroke in her mother. ROS:   Please see the history of present illness.   Denies any bleeding, syncope, orthopnea, PND All other review of systems negative.   EKGs/Labs/Other Studies Reviewed:    EKG:  Paced, personally viewed  Recent Labs: 03/18/2016: B Natriuretic  Peptide 765.5; TSH 4.251 03/21/2016: Magnesium 2.1 12/12/2016: ALT 14; Platelets 164 02/06/2017: BUN 20; Creatinine, Ser 0.90; Hemoglobin 12.9; Potassium 4.1; Sodium 142   Recent Lipid Panel No results found for: CHOL, TRIG, HDL, CHOLHDL, VLDL, LDLCALC, LDLDIRECT  Physical Exam:    VS:  BP 130/82   Pulse 76   Ht 5\' 5"  (1.651 m)   LMP  (LMP Unknown)   BMI 25.79 kg/m     Wt Readings from Last 3 Encounters:  02/06/17 155 lb (70.3 kg)  01/03/17 142 lb (64.4 kg)  12/19/16 141 lb 9.6 oz (64.2 kg)    GEN: Well nourished, well developed, in no acute distress  HEENT: normal  Neck: no JVD, carotid bruits, or masses Cardiac: RRR; no murmurs, rubs, or gallops,no edema, pacer Respiratory:  clear to auscultation bilaterally, normal work of breathing GI: soft, nontender, nondistended, + BS MS: R BKA  Skin: warm and dry, no rash Neuro:  Alert , Strength and sensation are intact Psych: euthymic mood, full affect, dementia   CXR 2 view 03/18/16 IMPRESSION: Central mild vascular congestion without convincing pulmonary edema. Stable trace bilateral pleural effusion with basilar atelectasis. No segmental infiltrates.  Echo 03/20/16 Study Conclusions  - Left ventricle: The cavity size was normal. Wall thickness was normal. Systolic function was severely reduced. The estimated ejection fraction was in the range of 20% to 25%. Diffuse hypokinesis. Doppler parameters are consistent with both elevated ventricular end-diastolic filling pressure and elevated left atrial filling pressure. - Aortic valve: Valve area (VTI): 0.63 cm^2. Valve area (Vmax): 0.89 cm^2. Valve area (Vmean): 0.75 cm^2. - Mitral valve: Calcified annulus. Mildly thickened leaflets . There was mild regurgitation. - Right ventricle: The cavity size was moderately dilated. - Right atrium: The atrium was moderately dilated. - Atrial septum: No defect or patent foramen ovale was identified. - Tricuspid valve:  There was moderate-severe regurgitation. - Pulmonary arteries: PA peak pressure: 39 mm Hg (S). - Pericardium, extracardiac: There was a left pleural effusion.  PV note 02/06/17  PROCEDURE: 1.  Right common femoral artery cannulation under ultrasound guidance 2.  Placement of catheter in aorta 3.  Aortogram 4.  Conscious sedation for 36 minutes 5.  Limited bilateral leg runoff (Bilateral pelvic angiogram) 6.  Right external iliac artery angioplasty and stenting (6 mm x 60 mm)     ASSESSMENT:    1. Hyperlipidemia, unspecified hyperlipidemia type   2. Long-term  use of high-risk medication   3. PAD (peripheral artery disease) ([place])   4. Tachycardia-bradycardia syndrome ([place])   5. S/P insertion of iliac artery stent   6. Permanent atrial fibrillation (HCC)    PLAN:    In order of problems listed above:  Acute on Chronic systolic heart failure/dilated cardiomyopathy  - Newly reduced ejection fraction of 20% with global hypokinesis, in September 2017 EF was 50% at [facility].  - During hospitalization we stopped diltiazem and started carvedilol. Continue ACE inhibitor lisinopril.   - We will continue with carvedilol 12.5 mg twice a day   -  Lasix to 40 mg once a day.  She had very aggressive diuresis with metolazone, 32 pounds previously.  Peripheral vascular disease  - Dr. Trula Slade, common iliac stenting  Permanent atrial fibrillation  - Coumadin therapy - bleeding  - Pacemaker interrogation showed permanent, 100% atrial fibrillation. No changes doing well.   Pacemaker St. Jude  - Dr. Caryl Comes is following. Remote noted.  Amputated right BKA.  - no changes  Peripheral vascular disease with wound/blister left foot  - Seen by Dr. Servando Snare and Dr. Bridgett Larsson  -Right foot wound resolved.  - Right external iliac 6 mm x 60 mm.Artery angioplasty and stenting  Hyperlipidemia  - checking lipids today, has been a while since last checked.  Atorva 40.   Dementia  - She was not  oriented to place or time.  Quite pleasant.  In a wheelchair.  Medication Adjustments/Labs and Tests Ordered: Current medicines are reviewed at length with the patient today.  Concerns regarding medicines are outlined above. Labs and tests ordered and medication changes are outlined in the patient instructions below:  Patient Instructions  Medication Instructions:  The current medical regimen is effective;  continue present plan and medications.  Labwork: Please have blood work today (Lipid)  Follow-Up: Follow up in 6 months with Cecilie Kicks, NP.  You will receive a letter in the mail 2 months before you are due.  Please call us when you receive this letter to schedule your follow up appointment.  Follow up in 1 year with Dr. Marlou Porch.  You will receive a letter in the mail 2 months before you are due.  Please call us when you receive this letter to schedule your follow up appointment.  If you need a refill on your cardiac medications before your next appointment, please call your pharmacy.  Thank you for choosing [facility]!!        Signed, Candee Furbish, MD  03/06/2017 11:48 AM    [place]

## 2017-03-06 NOTE — Patient Instructions (Addendum)
Medication Instructions:  The current medical regimen is effective;  continue present plan and medications.  Labwork: Please have blood work today (Lipid)  Follow-Up: Follow up in 6 months with Cecilie Kicks, NP.  You will receive a letter in the mail 2 months before you are due.  Please call us when you receive this letter to schedule your follow up appointment.  Follow up in 1 year with Dr. Marlou Porch.  You will receive a letter in the mail 2 months before you are due.  Please call us when you receive this letter to schedule your follow up appointment.  If you need a refill on your cardiac medications before your next appointment, please call your pharmacy.  Thank you for choosing [facility]!!

## 2017-03-07 LAB — LIPID PANEL
CHOL/HDL RATIO: 4.8 ratio — AB (ref 0.0–4.4)
Cholesterol, Total: 149 mg/dL (ref 100–199)
HDL: 31 mg/dL — AB (ref >39)
LDL Calculated: 99 mg/dL (ref 0–99)
Triglycerides: 93 mg/dL (ref 0–149)
VLDL CHOLESTEROL CAL: 19 mg/dL (ref 5–40)

## 2017-03-14 ENCOUNTER — Ambulatory Visit: Payer: Medicare HMO | Admitting: Vascular Surgery

## 2017-03-27 ENCOUNTER — Ambulatory Visit (INDEPENDENT_AMBULATORY_CARE_PROVIDER_SITE_OTHER): Payer: Medicare HMO | Admitting: Family

## 2017-03-27 ENCOUNTER — Other Ambulatory Visit: Payer: Self-pay

## 2017-03-27 ENCOUNTER — Encounter: Payer: Self-pay | Admitting: Family

## 2017-03-27 VITALS — BP 177/76 | HR 72 | Temp 98.5°F | Resp 16 | Ht 65.0 in | Wt 148.0 lb

## 2017-03-27 DIAGNOSIS — Z95828 Presence of other vascular implants and grafts: Secondary | ICD-10-CM

## 2017-03-27 DIAGNOSIS — I779 Disorder of arteries and arterioles, unspecified: Secondary | ICD-10-CM

## 2017-03-27 DIAGNOSIS — Z89511 Acquired absence of right leg below knee: Secondary | ICD-10-CM

## 2017-03-27 NOTE — Progress Notes (Signed)
Postoperative Visit   History of Present Illness  Sarah Moss is a 77 y.o. female who is s/p right external iliac artery angioplasty and stenting (6 mm x 60 mm) on 02-06-17 by Dr. Bridgett Larsson for right external iliac artery high grade stenosis.   She is also s/p bilateral common iliac artery stents placed in March 2018 by Dr. Trula Slade and was noted to have a left SFA occlusion at the time. At April 2018 visit she had a wound on the dorsum of her foot, this has healed.  She also has a previous traumatic right below-knee amputation   She returns today for 4 weeks follow up.  She denies any problems with her right BKA stump. She states she walks the halls of the NH with no walker, denies claudication type sx's in her left leg with walking.  She takes coumadin, has a hx of atrial fib.  Her DM is in excellent control, last A1C result on file was 5.8 on 12-12-16. She is a former smoker.   For VQI Use Only  PRE-ADM LIVING: Nursing home, [facility]  AMB STATUS: Ambulatory    Past Medical History:  Diagnosis Date  . Asthma   . Atrial fibrillation ([place])   . CHF (congestive heart failure) ([place])   . COPD (chronic obstructive pulmonary disease) ([place])   . Dementia   . Diabetes mellitus without complication ([place])   . GERD (gastroesophageal reflux disease)   . Hx of right BKA ([place])   . Hyperlipidemia   . Hypertension   . Melanoma ([place])    chest wall  . Pacemaker   . PAD (peripheral artery disease) (HCC)    L SFA, RCIA, LCIA stents  . Sleep apnea    documented in pt history  . Tachycardia-bradycardia syndrome [facility])     Past Surgical History:  Procedure Laterality Date  . ABDOMINAL AORTOGRAM W/LOWER EXTREMITY N/A 02/06/2017   Procedure: ABDOMINAL AORTOGRAM W/LOWER EXTREMITY;  Surgeon: Conrad Oberlin, MD;  Location: [place] CV LAB;  Service: Cardiovascular;  Laterality: N/A;  . LOWER EXTREMITY ANGIOGRAPHY N/A 03/19/2016   Procedure: Lower Extremity Angiography;  Surgeon: Serafina Mitchell, MD;  Location: [place] [facility];  Service: Cardiovascular;  Laterality: N/A;  . MELANOMA EXCISION N/A 12/12/2016   Procedure: WIDE LOCAL EXCISION WITH ADVANCEMENT FLAP CLOSURE FOR CHEST WALL MELANOMA;  Surgeon: Stark Klein, MD;  Location: [place];  Service: General;  Laterality: N/A;  . PERIPHERAL VASCULAR INTERVENTION Bilateral 03/19/2016   Procedure: Peripheral Vascular Intervention;  Surgeon: Serafina Mitchell, MD;  Location: [place] CV LAB;  Service: Cardiovascular;  Laterality: Bilateral;  iliacs  . PERIPHERAL VASCULAR INTERVENTION Right 02/06/2017   Procedure: PERIPHERAL VASCULAR INTERVENTION;  Surgeon: Conrad Sheridan, MD;  Location: [place] CV LAB;  Service: Cardiovascular;  Laterality: Right;  external iliac    Social History   Socioeconomic History  . Marital status: Married    Spouse name: Not on file  . Number of children: Not on file  . Years of education: Not on file  . Highest education level: Not on file  Social Needs  . Financial resource strain: Not on file  . Food insecurity - worry: Not on file  . Food insecurity - inability: Not on file  . Transportation needs - medical: Not on file  . Transportation needs - non-medical: Not on file  Occupational History  . Not on file  Tobacco Use  . Smoking status: Former Research scientist (life sciences)  . Smokeless tobacco:  Never Used  Substance and Sexual Activity  . Alcohol use: No  . Drug use: No  . Sexual activity: Not on file  Other Topics Concern  . Not on file  Social History Narrative  . Not on file    Allergies  Allergen Reactions  . Ambien [Zolpidem] Other (See Comments)    "Has the opposite effect; makes me more alert/hyper"  . Ativan [Lorazepam] Other (See Comments)    "Makes me confused and crazy"  . Codeine Itching and Nausea And Vomiting    Current Outpatient Medications on File Prior to Visit  Medication Sig Dispense Refill  . ADVAIR DISKUS 250-50 MCG/DOSE AEPB Inhale 1 puff into the lungs 2 (two) times  daily.    Marland Kitchen aspirin EC 81 MG EC tablet Take 1 tablet (81 mg total) by mouth daily.    Marland Kitchen atorvastatin (LIPITOR) 40 MG tablet Take 40 mg by mouth every evening.     . carvedilol (COREG) 12.5 MG tablet Take 1 tablet (12.5 mg total) by mouth 2 (two) times daily with a meal. 60 tablet 9  . DULoxetine (CYMBALTA) 60 MG capsule Take 60 mg by mouth daily.    . furosemide (LASIX) 40 MG tablet Take 40 mg by mouth daily.    Marland Kitchen gabapentin (NEURONTIN) 300 MG capsule Take 300 mg by mouth at bedtime.    . iron polysaccharides (NIFEREX) 150 MG capsule Take 1 capsule (150 mg total) by mouth daily.    Marland Kitchen levothyroxine (SYNTHROID, LEVOTHROID) 25 MCG tablet Take 25 mcg by mouth daily.    Marland Kitchen lidocaine (LIDODERM) 5 % Place 1 patch onto the skin every 12 (twelve) hours. Remove & Discard patch within 12 hours or as directed by MD    . lisinopril (PRINIVIL,ZESTRIL) 20 MG tablet Take 20 mg by mouth daily.    . metFORMIN (GLUCOPHAGE) 1000 MG tablet Take 1,000 mg by mouth 2 (two) times daily.    . montelukast (SINGULAIR) 10 MG tablet Take 10 mg by mouth at bedtime.    . nitroGLYCERIN (NITROSTAT) 0.4 MG SL tablet Place 0.4 mg under the tongue every 5 (five) minutes as needed for chest pain.    Marland Kitchen omeprazole (PRILOSEC) 40 MG capsule Take 40 mg by mouth daily.    Marland Kitchen oxyCODONE-acetaminophen (PERCOCET/ROXICET) 5-325 MG tablet Take 1 tablet by mouth every 6 (six) hours as needed for severe pain. (Patient taking differently: Take 1 tablet by mouth every 4 (four) hours as needed for severe pain. ) 10 tablet 0  . oxymetazoline (AFRIN) 0.05 % nasal spray Place 2 sprays into both nostrils daily. May use an additional 2 spray in each nostril every hour as needed for dryness or nosebleed    . polyethylene glycol (MIRALAX / GLYCOLAX) packet Take 17 g by mouth 2 (two) times daily. 14 each 0  . senna-docusate (SENOKOT-S) 8.6-50 MG tablet Take 1 tablet by mouth 2 (two) times daily.    . traZODone (DESYREL) 50 MG tablet Take 50 mg by mouth at  bedtime.    . Vitamin D, Ergocalciferol, (DRISDOL) 50000 units CAPS capsule Take 50,000 Units by mouth 2 (two) times a week.    . warfarin (COUMADIN) 5 MG tablet Take 5 mg by mouth every evening.     No current facility-administered medications on file prior to visit.      Physical Examination  Vitals:   03/27/17 1329  BP: (!) 177/76  Pulse: 72  Resp: 16  Temp: 98.5 F (36.9 C)  TempSrc: Oral  SpO2: 93%  Weight: 148 lb (67.1 kg)  Height: 5\' 5"  (1.651 m)   Body mass index is 24.63 kg/m.  PHYSICAL EXAMINATION: General: The patient appears her stated age.   HEENT:  No gross abnormalities Pulmonary: Respirations are non-labored Abdomen: Soft and non-tender.  No hematoma in right groin, cannulation site well healed and sealed, scant ecchymosis remains.  Musculoskeletal: There are no major deformities. Right BKA prosthesis in place.  Neurologic: No focal weakness or paresthesias are detected Skin: There are no ulcer or rashes noted. No signs of ischemia in her left foot or leg. Left toes are pink and warm with brisk capillary refill.  Psychiatric: The patient has normal affect. Cardiovascular: There is a regular rate and rhythm  Vascular: Vessel Right Left  Radial Palpable Palpable  Ulnar Palpable Palpable  Brachial Palpable Palpable  Carotid Palpable, without bruit Palpable, without bruit  Aorta Not palpable N/A  Femoral 1+Palpable 2+Palpable  Popliteal Not palpable Not palpable  PT BKA not Palpable, unable to obtain Doppler signal  DP BKA not Palpable, dampened Dopplersignal  Peroneal  Brisk Doppler signal    Medical Decision Making  ANYA MURPHEY is a 77 y.o. female who presents s/p right external iliac artery angioplasty and stenting on 02-06-17.  She is also s/p bilateral common iliac artery stents placed in March 2018 by Dr. Trula Slade and was noted to have a left SFA occlusion at the time. At April 2018 visit she had a wound on the dorsum of her foot, this has  healed.  She also has a previous traumatic right below-knee amputation.  She has no issues with her right BKA stump. There is a brisk Doppler signal at her left peroneal artery, dampened DP, and unable to obtain Doppler signal at left PT.   Based on her physical exam and HPI, pt to return in 3 months with left ABI and bilateral aortoiliac duplex.   Continue daily walking, if she is unsteady,then with assistance. Daily seated leg exercises demonstrated and discussed.   I discussed in depth with the patient the nature of atherosclerosis, and emphasized the importance of maximal medical management including strict control of blood pressure, blood glucose, and lipid levels, obtaining regular exercise, and cessation of smoking.  The patient is aware that without maximal medical management the underlying atherosclerotic disease process will progress, limiting the benefit of any interventions.  The patient is currently on a statin The patient is currently on an anti-platelet: ASA.  Thank you for allowing [place] to participate in this patient's care.  Clemon Chambers, RN, MSN, FNP-C [facility] Office: [phone]  03/27/2017, 1:33 PM  Clinic MD: One[place] Alar

## 2017-03-27 NOTE — Patient Instructions (Signed)

## 2017-04-01 ENCOUNTER — Other Ambulatory Visit: Payer: Self-pay

## 2017-04-01 DIAGNOSIS — I739 Peripheral vascular disease, unspecified: Secondary | ICD-10-CM

## 2017-04-01 DIAGNOSIS — I70229 Atherosclerosis of native arteries of extremities with rest pain, unspecified extremity: Secondary | ICD-10-CM

## 2017-04-01 DIAGNOSIS — I998 Other disorder of circulatory system: Secondary | ICD-10-CM

## 2017-04-15 ENCOUNTER — Ambulatory Visit (INDEPENDENT_AMBULATORY_CARE_PROVIDER_SITE_OTHER): Payer: Medicare HMO | Admitting: *Deleted

## 2017-04-15 DIAGNOSIS — I495 Sick sinus syndrome: Secondary | ICD-10-CM

## 2017-04-15 NOTE — Progress Notes (Signed)
Remote pacemaker transmission.   

## 2017-04-16 ENCOUNTER — Encounter: Payer: Self-pay | Admitting: Cardiology

## 2017-05-02 ENCOUNTER — Emergency Department (HOSPITAL_COMMUNITY)
Admission: EM | Admit: 2017-05-02 | Discharge: 2017-05-02 | Disposition: A | Discharge status: Home / Self Care | Payer: Medicare HMO | Source: Home / Self Care | Attending: Emergency Medicine | Admitting: Emergency Medicine

## 2017-05-02 ENCOUNTER — Encounter (HOSPITAL_COMMUNITY): Payer: Self-pay | Admitting: Emergency Medicine

## 2017-05-02 ENCOUNTER — Other Ambulatory Visit: Payer: Self-pay

## 2017-05-02 ENCOUNTER — Emergency Department (HOSPITAL_COMMUNITY): Payer: Medicare HMO

## 2017-05-02 DIAGNOSIS — Z79899 Other long term (current) drug therapy: Secondary | ICD-10-CM

## 2017-05-02 DIAGNOSIS — Z794 Long term (current) use of insulin: Secondary | ICD-10-CM | POA: Insufficient documentation

## 2017-05-02 DIAGNOSIS — F0390 Unspecified dementia without behavioral disturbance: Secondary | ICD-10-CM

## 2017-05-02 DIAGNOSIS — I5022 Chronic systolic (congestive) heart failure: Secondary | ICD-10-CM | POA: Insufficient documentation

## 2017-05-02 DIAGNOSIS — E1142 Type 2 diabetes mellitus with diabetic polyneuropathy: Secondary | ICD-10-CM | POA: Insufficient documentation

## 2017-05-02 DIAGNOSIS — I130 Hypertensive heart and chronic kidney disease with heart failure and stage 1 through stage 4 chronic kidney disease, or unspecified chronic kidney disease: Secondary | ICD-10-CM | POA: Insufficient documentation

## 2017-05-02 DIAGNOSIS — R079 Chest pain, unspecified: Secondary | ICD-10-CM

## 2017-05-02 DIAGNOSIS — Z7982 Long term (current) use of aspirin: Secondary | ICD-10-CM

## 2017-05-02 DIAGNOSIS — E039 Hypothyroidism, unspecified: Secondary | ICD-10-CM

## 2017-05-02 DIAGNOSIS — Z87891 Personal history of nicotine dependence: Secondary | ICD-10-CM

## 2017-05-02 DIAGNOSIS — Z7901 Long term (current) use of anticoagulants: Secondary | ICD-10-CM

## 2017-05-02 DIAGNOSIS — N183 Chronic kidney disease, stage 3 (moderate): Secondary | ICD-10-CM

## 2017-05-02 DIAGNOSIS — A419 Sepsis, unspecified organism: Secondary | ICD-10-CM | POA: Diagnosis not present

## 2017-05-02 DIAGNOSIS — R0602 Shortness of breath: Secondary | ICD-10-CM | POA: Diagnosis not present

## 2017-05-02 LAB — I-STAT TROPONIN, ED
TROPONIN I, POC: 0.01 ng/mL (ref 0.00–0.08)
TROPONIN I, POC: 0.03 ng/mL (ref 0.00–0.08)

## 2017-05-02 LAB — CBC
HCT: 30.8 % — ABNORMAL LOW (ref 36.0–46.0)
Hemoglobin: 9.9 g/dL — ABNORMAL LOW (ref 12.0–15.0)
MCH: 26.8 pg (ref 26.0–34.0)
MCHC: 32.1 g/dL (ref 30.0–36.0)
MCV: 83.5 fL (ref 78.0–100.0)
PLATELETS: 174 10*3/uL (ref 150–400)
RBC: 3.69 MIL/uL — ABNORMAL LOW (ref 3.87–5.11)
RDW: 16.1 % — ABNORMAL HIGH (ref 11.5–15.5)
WBC: 12 10*3/uL — ABNORMAL HIGH (ref 4.0–10.5)

## 2017-05-02 LAB — BASIC METABOLIC PANEL
Anion gap: 14 (ref 5–15)
BUN: 18 mg/dL (ref 6–20)
CHLORIDE: 100 mmol/L — AB (ref 101–111)
CO2: 19 mmol/L — ABNORMAL LOW (ref 22–32)
CREATININE: 1.24 mg/dL — AB (ref 0.44–1.00)
Calcium: 8.2 mg/dL — ABNORMAL LOW (ref 8.9–10.3)
GFR calc Af Amer: 47 mL/min — ABNORMAL LOW (ref >60)
GFR calc non Af Amer: 41 mL/min — ABNORMAL LOW (ref >60)
Glucose, Bld: 136 mg/dL — ABNORMAL HIGH (ref 65–99)
POTASSIUM: 3.4 mmol/L — AB (ref 3.5–5.1)
SODIUM: 133 mmol/L — AB (ref 135–145)

## 2017-05-02 NOTE — ED Notes (Signed)
Dee to call [facility].

## 2017-05-02 NOTE — Discharge Instructions (Addendum)
Continue your medications as previously prescribed.  And return to the ER if symptoms significantly worsen or change.

## 2017-05-02 NOTE — ED Provider Notes (Signed)
[facility] EMERGENCY DEPARTMENT Provider Note   CSN: [id number] Arrival date & time: 05/02/17  0122     History   Chief Complaint Chief Complaint  Patient presents with  . Chest Pain    HPI Sarah Moss is a 77 y.o. female.  Patient is a 77 year old female with past medical history of CHF, COPD, hypertension, pacemaker placement, peripheral artery disease, and A. fib.  She presents today with complaints of chest pain.  I am told that she woke up at her extended care facility complaining of chest discomfort.  EMS stated that she was yelling and crying upon their arrival.  She was given nitroglycerin with little relief.  The patient's symptoms have since resolved.  She does not recall this episode.  She currently denies any chest pain or difficulty breathing.  She can add no additional history regarding the nature of her symptoms as they have resolved and she does not recall this episode.  She does have a history of dementia that complicates the history.  The history is provided by the patient.  Chest Pain   This is a new problem. The current episode started 1 to 2 hours ago. The problem occurs constantly. The problem has been resolved.    Past Medical History:  Diagnosis Date  . Asthma   . Atrial fibrillation ([place])   . CHF (congestive heart failure) ([place])   . COPD (chronic obstructive pulmonary disease) ([place])   . Dementia   . Diabetes mellitus without complication ([place])   . GERD (gastroesophageal reflux disease)   . Hx of right BKA ([place])   . Hyperlipidemia   . Hypertension   . Melanoma ([place])    chest wall  . Pacemaker   . PAD (peripheral artery disease) (HCC)    L SFA, RCIA, LCIA stents  . Sleep apnea    documented in pt history  . Tachycardia-bradycardia syndrome [facility])     Patient Active Problem List   Diagnosis Date Noted  . Critical lower limb ischemia 02/06/2017  . Ileus ([place]) 07/24/2016  . Permanent atrial fibrillation ([place]) 05/10/2016    . Status post below knee amputation of right lower extremity ([place]) 05/10/2016  . Longstanding persistent atrial fibrillation ([place]) 04/02/2016  . CHB (complete heart block) (HCC) s/p AV node ablation 04/02/2016  . Chronic anticoagulation 03/22/2016  . Microcytic anemia 03/22/2016  . Diabetes mellitus with hemoglobin A1c goal of 7.0%-8.0% ([place])   . Cardiomyopathy ([place]) 03/21/2016  . PAD (peripheral artery disease) ([place])   . Chronic atrial fibrillation ([place]) 03/18/2016  . Amputee, below knee ([place]) 03/18/2016  . Congestive heart failure ([place]) 03/18/2016  . Pulmonary emphysema ([place]) 03/18/2016  . GERD (gastroesophageal reflux disease) 03/18/2016  . Acute systolic heart failure ([place]) 03/18/2016  . Bilateral pleural effusion 02/24/2015  . S/P AV nodal ablation 02/21/2015  . Cardiac pacemaker in situ 02/20/2015  . Urticaria 02/20/2015  . Type 2 diabetes mellitus with complication ([place]) 82/42/3536  . Tachy-brady syndrome ([place]) 12/19/2014  . Normal left ventricular systolic function and wall motion 10/12/2014  . Benign essential hypertension 09/14/2014  . Dyspnea 09/14/2014  . Long-term use of aspirin therapy 09/14/2014  . Allergic rhinitis 08/03/2014  . DOE (dyspnea on exertion) 08/03/2014  . Pulmonary HTN ([place]) 08/03/2014  . Sleep apnea 08/03/2014  . Sleep apnea with use of continuous positive airway pressure (CPAP) 08/03/2014  . Chronic diastolic CHF (congestive heart failure) ([place]) 05/30/2014  . HTN (hypertension) 11/04/2012  . Hypothyroidism 11/04/2012  . Type  2 diabetes mellitus with diabetic polyneuropathy ([place]) 11/04/2012  . Red blood cell antibody positive 10/28/2011  . Arthropathy 08/17/2009  . Mixed incontinence 08/17/2009  . Benign renovascular hypertension 08/17/2009  . Anxiety state 06/05/2009  . Abnormal finding on thyroid function test 06/01/2009  . Vitamin D deficiency 06/01/2009  . Type II or unspecified type diabetes mellitus without mention of complication, not  stated as uncontrolled 05/26/2009  . Asthma 05/24/2009  . Chronic kidney disease, stage III (moderate) ([place]) 05/24/2009  . Depressive disorder, not elsewhere classified 05/24/2009  . Insomnia 05/24/2009  . Osteoarthritis 05/24/2009  . Osteoporosis 05/24/2009  . Peripheral vascular disease ([place]) 05/24/2009    Past Surgical History:  Procedure Laterality Date  . ABDOMINAL AORTOGRAM W/LOWER EXTREMITY N/A 02/06/2017   Procedure: ABDOMINAL AORTOGRAM W/LOWER EXTREMITY;  Surgeon: Conrad Ohkay Owingeh, MD;  Location: [facility];  Service: Cardiovascular;  Laterality: N/A;  . LOWER EXTREMITY ANGIOGRAPHY N/A 03/19/2016   Procedure: Lower Extremity Angiography;  Surgeon: Serafina Mitchell, MD;  Location: [facility];  Service: Cardiovascular;  Laterality: N/A;  . MELANOMA EXCISION N/A 12/12/2016   Procedure: WIDE LOCAL EXCISION WITH ADVANCEMENT FLAP CLOSURE FOR CHEST WALL MELANOMA;  Surgeon: Stark Klein, MD;  Location: [facility];  Service: General;  Laterality: N/A;  . PERIPHERAL VASCULAR INTERVENTION Bilateral 03/19/2016   Procedure: Peripheral Vascular Intervention;  Surgeon: Serafina Mitchell, MD;  Location: [facility];  Service: Cardiovascular;  Laterality: Bilateral;  iliacs  . PERIPHERAL VASCULAR INTERVENTION Right 02/06/2017   Procedure: PERIPHERAL VASCULAR INTERVENTION;  Surgeon: Conrad Avondale, MD;  Location: [facility];  Service: Cardiovascular;  Laterality: Right;  external iliac     OB History   None      Home Medications    Prior to Admission medications   Medication Sig Start Date End Date Taking? Authorizing Provider  ADVAIR DISKUS 250-50 MCG/DOSE AEPB Inhale 1 puff into the lungs 2 (two) times daily. 03/08/16   [provider]  aspirin EC 81 MG EC tablet Take 1 tablet (81 mg total) by mouth daily. 03/24/16   Eugenie Filler, MD  atorvastatin (LIPITOR) 40 MG tablet Take 40 mg by mouth every evening.  05/30/14   [provider]  carvedilol (COREG)  12.5 MG tablet Take 1 tablet (12.5 mg total) by mouth 2 (two) times daily with a meal. 05/27/16   Isaiah Serge, NP  DULoxetine (CYMBALTA) 60 MG capsule Take 60 mg by mouth daily.    [provider]  furosemide (LASIX) 40 MG tablet Take 40 mg by mouth daily.    [provider]  gabapentin (NEURONTIN) 300 MG capsule Take 300 mg by mouth at bedtime. 05/30/14   [provider]  iron polysaccharides (NIFEREX) 150 MG capsule Take 1 capsule (150 mg total) by mouth daily. 03/24/16   Eugenie Filler, MD  levothyroxine (SYNTHROID, LEVOTHROID) 25 MCG tablet Take 25 mcg by mouth daily. 05/30/14   [provider]  lidocaine (LIDODERM) 5 % Place 1 patch onto the skin every 12 (twelve) hours. Remove & Discard patch within 12 hours or as directed by MD    [provider]  lisinopril (PRINIVIL,ZESTRIL) 20 MG tablet Take 20 mg by mouth daily.    [provider]  metFORMIN (GLUCOPHAGE) 1000 MG tablet Take 1,000 mg by mouth 2 (two) times daily. 05/30/14   [provider]  montelukast (SINGULAIR) 10 MG tablet Take 10 mg by mouth at bedtime.  [provider]  nitroGLYCERIN (NITROSTAT) 0.4 MG SL tablet Place 0.4 mg under the tongue every 5 (five) minutes as needed for chest pain.    [provider]  omeprazole (PRILOSEC) 40 MG capsule Take 40 mg by mouth daily.    [provider]  oxyCODONE-acetaminophen (PERCOCET/ROXICET) 5-325 MG tablet Take 1 tablet by mouth every 6 (six) hours as needed for severe pain. Patient taking differently: Take 1 tablet by mouth every 4 (four) hours as needed for severe pain.  07/28/16   Oswald Hillock, MD  oxymetazoline (AFRIN) 0.05 % nasal spray Place 2 sprays into both nostrils daily. May use an additional 2 spray in each nostril every hour as needed for dryness or nosebleed    [provider]  polyethylene glycol (MIRALAX / GLYCOLAX) packet Take 17 g by mouth 2 (two) times daily. 03/23/16    Eugenie Filler, MD  senna-docusate (SENOKOT-S) 8.6-50 MG tablet Take 1 tablet by mouth 2 (two) times daily. 03/23/16   Eugenie Filler, MD  traZODone (DESYREL) 50 MG tablet Take 50 mg by mouth at bedtime.    [provider]  Vitamin D, Ergocalciferol, (DRISDOL) 50000 units CAPS capsule Take 50,000 Units by mouth 2 (two) times a week.    [provider]  warfarin (COUMADIN) 5 MG tablet Take 5 mg by mouth every evening.    [provider]    Family History Family History  Problem Relation Age of Onset  . Stroke Mother   . Diabetes Mother   . Cancer Father     Social History Social History   Tobacco Use  . Smoking status: Former Research scientist (life sciences)  . Smokeless tobacco: Never Used  Substance Use Topics  . Alcohol use: No  . Drug use: No     Allergies   Ambien [zolpidem]; Ativan [lorazepam]; and Codeine   Review of Systems Review of Systems  Cardiovascular: Positive for chest pain.  All other systems reviewed and are negative.    Physical Exam Updated Vital Signs BP 140/67   Pulse 74   Temp 99.3 F (37.4 C) (Oral)   Resp 13   Ht 5\' 5"  (1.651 m)   Wt 70.3 kg (155 lb)   LMP  (LMP Unknown)   SpO2 93%   BMI 25.79 kg/m   Physical Exam  Constitutional: She is oriented to person, place, and time. She appears well-developed and well-nourished. No distress.  HENT:  Head: Normocephalic and atraumatic.  Neck: Normal range of motion. Neck supple.  Cardiovascular: Normal rate and regular rhythm. Exam reveals no gallop and no friction rub.  No murmur heard. Pulmonary/Chest: Effort normal and breath sounds normal. No respiratory distress. She has no wheezes.  Abdominal: Soft. Bowel sounds are normal. She exhibits no distension. There is no tenderness.  Musculoskeletal: Normal range of motion.       Right lower leg: She exhibits no edema.       Left lower leg: Normal. She exhibits no edema.  Status post right BKA,  Neurological: She is alert and  oriented to person, place, and time.  Skin: Skin is warm and dry. She is not diaphoretic.  Nursing note and vitals reviewed.    ED Treatments / Results  Labs (all labs ordered are listed, but only abnormal results are displayed) Labs Reviewed  BASIC METABOLIC PANEL  CBC  I-STAT TROPONIN, ED    EKG EKG Interpretation  Date/Time:  Friday May 02 2017 01:29:08 EDT Ventricular Rate:  74 PR Interval:  QRS Duration: 149 QT Interval:  401 QTC Calculation: 445 R Axis:   -88 Text Interpretation:  Sinus rhythm Nonspecific IVCD with LAD LVH with secondary repolarization abnormality Inferior infarct, old Anterolateral infarct, recent Baseline wander in lead(s) III Confirmed by Veryl Speak [phone]) on 05/02/2017 1:46:04 AM   Radiology No results found.  Procedures Procedures (including critical care time)  Medications Ordered in ED Medications - No data to display   Initial Impression / Assessment and Plan / ED Course  I have reviewed the triage vital signs and the nursing notes.  Pertinent labs & imaging results that were available during my care of the patient were reviewed by me and considered in my medical decision making (see chart for details).  Patient brought by EMS from her extended care facility for evaluation of chest discomfort.  This woke her from sleep.  The patient has a history of dementia and does not recall having this discomfort and is not having any discomfort currently.  Her EKG is unchanged and troponin x2 is negative.  She has been observed for several hours and appears in my opinion stable for discharge.  She is to return as needed for any problems.  Final Clinical Impressions(s) / ED Diagnoses   Final diagnoses:  None    ED Discharge Orders    None       Veryl Speak, MD 05/02/17 [phone]

## 2017-05-02 NOTE — ED Notes (Signed)
Family at bedside. 

## 2017-05-02 NOTE — ED Triage Notes (Signed)
Per [facility], Pt from [facility]. Pt woke up with central chest pain approximately 1.5 hours ago. Pt states, "the pain was minor." EMS reports the patient was yelling and crying upon their arrival to SNF. Pt received 2 nitro by staff at [facility] with improvement. Pt reports she had some nausea. Pt is diaphoretic. Pt currently denies chest pain, complains of headache (8/10) and R side neck pain. Pt has pacemaker.

## 2017-05-04 ENCOUNTER — Emergency Department (HOSPITAL_COMMUNITY): Payer: Medicare HMO

## 2017-05-04 ENCOUNTER — Inpatient Hospital Stay (HOSPITAL_COMMUNITY)
Admission: EM | Admit: 2017-05-04 | Discharge: 2017-05-07 | DRG: 871 | Disposition: A | Discharge status: Skilled Nursing Facility | Payer: Medicare HMO | Attending: Internal Medicine | Admitting: Internal Medicine

## 2017-05-04 DIAGNOSIS — Z7901 Long term (current) use of anticoagulants: Secondary | ICD-10-CM

## 2017-05-04 DIAGNOSIS — J441 Chronic obstructive pulmonary disease with (acute) exacerbation: Secondary | ICD-10-CM | POA: Diagnosis present

## 2017-05-04 DIAGNOSIS — Z833 Family history of diabetes mellitus: Secondary | ICD-10-CM

## 2017-05-04 DIAGNOSIS — Z66 Do not resuscitate: Secondary | ICD-10-CM | POA: Diagnosis present

## 2017-05-04 DIAGNOSIS — E876 Hypokalemia: Secondary | ICD-10-CM | POA: Diagnosis present

## 2017-05-04 DIAGNOSIS — A419 Sepsis, unspecified organism: Principal | ICD-10-CM | POA: Diagnosis present

## 2017-05-04 DIAGNOSIS — Z950 Presence of cardiac pacemaker: Secondary | ICD-10-CM

## 2017-05-04 DIAGNOSIS — R0600 Dyspnea, unspecified: Secondary | ICD-10-CM

## 2017-05-04 DIAGNOSIS — Z87440 Personal history of urinary (tract) infections: Secondary | ICD-10-CM

## 2017-05-04 DIAGNOSIS — Z85820 Personal history of malignant melanoma of skin: Secondary | ICD-10-CM

## 2017-05-04 DIAGNOSIS — J9601 Acute respiratory failure with hypoxia: Secondary | ICD-10-CM | POA: Diagnosis present

## 2017-05-04 DIAGNOSIS — Z79899 Other long term (current) drug therapy: Secondary | ICD-10-CM

## 2017-05-04 DIAGNOSIS — Y95 Nosocomial condition: Secondary | ICD-10-CM | POA: Diagnosis present

## 2017-05-04 DIAGNOSIS — E1151 Type 2 diabetes mellitus with diabetic peripheral angiopathy without gangrene: Secondary | ICD-10-CM | POA: Diagnosis present

## 2017-05-04 DIAGNOSIS — F0390 Unspecified dementia without behavioral disturbance: Secondary | ICD-10-CM | POA: Diagnosis present

## 2017-05-04 DIAGNOSIS — I482 Chronic atrial fibrillation, unspecified: Secondary | ICD-10-CM | POA: Diagnosis present

## 2017-05-04 DIAGNOSIS — E785 Hyperlipidemia, unspecified: Secondary | ICD-10-CM | POA: Diagnosis present

## 2017-05-04 DIAGNOSIS — Z79890 Hormone replacement therapy (postmenopausal): Secondary | ICD-10-CM

## 2017-05-04 DIAGNOSIS — G4730 Sleep apnea, unspecified: Secondary | ICD-10-CM | POA: Diagnosis present

## 2017-05-04 DIAGNOSIS — I5032 Chronic diastolic (congestive) heart failure: Secondary | ICD-10-CM | POA: Diagnosis present

## 2017-05-04 DIAGNOSIS — R6520 Severe sepsis without septic shock: Secondary | ICD-10-CM | POA: Diagnosis present

## 2017-05-04 DIAGNOSIS — R0902 Hypoxemia: Secondary | ICD-10-CM

## 2017-05-04 DIAGNOSIS — I150 Renovascular hypertension: Secondary | ICD-10-CM | POA: Diagnosis present

## 2017-05-04 DIAGNOSIS — I5043 Acute on chronic combined systolic (congestive) and diastolic (congestive) heart failure: Secondary | ICD-10-CM | POA: Diagnosis present

## 2017-05-04 DIAGNOSIS — Z888 Allergy status to other drugs, medicaments and biological substances status: Secondary | ICD-10-CM

## 2017-05-04 DIAGNOSIS — Z89511 Acquired absence of right leg below knee: Secondary | ICD-10-CM

## 2017-05-04 DIAGNOSIS — Z7984 Long term (current) use of oral hypoglycemic drugs: Secondary | ICD-10-CM

## 2017-05-04 DIAGNOSIS — Z7982 Long term (current) use of aspirin: Secondary | ICD-10-CM

## 2017-05-04 DIAGNOSIS — J181 Lobar pneumonia, unspecified organism: Secondary | ICD-10-CM | POA: Diagnosis present

## 2017-05-04 DIAGNOSIS — E119 Type 2 diabetes mellitus without complications: Secondary | ICD-10-CM

## 2017-05-04 DIAGNOSIS — K219 Gastro-esophageal reflux disease without esophagitis: Secondary | ICD-10-CM | POA: Diagnosis present

## 2017-05-04 DIAGNOSIS — N179 Acute kidney failure, unspecified: Secondary | ICD-10-CM | POA: Diagnosis present

## 2017-05-04 DIAGNOSIS — Z885 Allergy status to narcotic agent status: Secondary | ICD-10-CM

## 2017-05-04 DIAGNOSIS — Z7951 Long term (current) use of inhaled steroids: Secondary | ICD-10-CM

## 2017-05-04 DIAGNOSIS — Z87891 Personal history of nicotine dependence: Secondary | ICD-10-CM

## 2017-05-04 DIAGNOSIS — J440 Chronic obstructive pulmonary disease with acute lower respiratory infection: Secondary | ICD-10-CM | POA: Diagnosis present

## 2017-05-04 DIAGNOSIS — R0602 Shortness of breath: Secondary | ICD-10-CM

## 2017-05-04 DIAGNOSIS — J189 Pneumonia, unspecified organism: Secondary | ICD-10-CM | POA: Diagnosis present

## 2017-05-04 LAB — COMPREHENSIVE METABOLIC PANEL
ALBUMIN: 2.7 g/dL — AB (ref 3.5–5.0)
ALT: 11 U/L — ABNORMAL LOW (ref 14–54)
AST: 16 U/L (ref 15–41)
Alkaline Phosphatase: 68 U/L (ref 38–126)
Anion gap: 16 — ABNORMAL HIGH (ref 5–15)
BUN: 37 mg/dL — ABNORMAL HIGH (ref 6–20)
CALCIUM: 8.3 mg/dL — AB (ref 8.9–10.3)
CHLORIDE: 103 mmol/L (ref 101–111)
CO2: 18 mmol/L — AB (ref 22–32)
Creatinine, Ser: 1.58 mg/dL — ABNORMAL HIGH (ref 0.44–1.00)
GFR calc non Af Amer: 30 mL/min — ABNORMAL LOW (ref >60)
GFR, EST AFRICAN AMERICAN: 35 mL/min — AB (ref >60)
GLUCOSE: 161 mg/dL — AB (ref 65–99)
POTASSIUM: 4.1 mmol/L (ref 3.5–5.1)
Sodium: 137 mmol/L (ref 135–145)
Total Bilirubin: 1.4 mg/dL — ABNORMAL HIGH (ref 0.3–1.2)
Total Protein: 6.9 g/dL (ref 6.5–8.1)

## 2017-05-04 LAB — CBC WITH DIFFERENTIAL/PLATELET
BASOS PCT: 0 %
Basophils Absolute: 0 10*3/uL (ref 0.0–0.1)
EOS ABS: 0 10*3/uL (ref 0.0–0.7)
Eosinophils Relative: 0 %
HEMATOCRIT: 29.5 % — AB (ref 36.0–46.0)
HEMOGLOBIN: 9.6 g/dL — AB (ref 12.0–15.0)
LYMPHS PCT: 11 %
Lymphs Abs: 1.6 10*3/uL (ref 0.7–4.0)
MCH: 27.1 pg (ref 26.0–34.0)
MCHC: 32.5 g/dL (ref 30.0–36.0)
MCV: 83.3 fL (ref 78.0–100.0)
MONOS PCT: 5 %
Monocytes Absolute: 0.7 10*3/uL (ref 0.1–1.0)
NEUTROS ABS: 12.2 10*3/uL — AB (ref 1.7–7.7)
NEUTROS PCT: 84 %
Platelets: 221 10*3/uL (ref 150–400)
RBC: 3.54 MIL/uL — ABNORMAL LOW (ref 3.87–5.11)
RDW: 16.2 % — ABNORMAL HIGH (ref 11.5–15.5)
WBC: 14.5 10*3/uL — ABNORMAL HIGH (ref 4.0–10.5)

## 2017-05-04 LAB — BRAIN NATRIURETIC PEPTIDE: B NATRIURETIC PEPTIDE 5: 1393.6 pg/mL — AB (ref 0.0–100.0)

## 2017-05-04 LAB — I-STAT TROPONIN, ED: TROPONIN I, POC: 0.05 ng/mL (ref 0.00–0.08)

## 2017-05-04 LAB — PROTIME-INR
INR: 1.61
Prothrombin Time: 19 seconds — ABNORMAL HIGH (ref 11.4–15.2)

## 2017-05-04 LAB — I-STAT CG4 LACTIC ACID, ED: LACTIC ACID, VENOUS: 1.6 mmol/L (ref 0.5–1.9)

## 2017-05-04 MED ORDER — SODIUM CHLORIDE 0.9 % IV SOLN
Freq: Once | INTRAVENOUS | Status: AC
  Administered 2017-05-05: 01:00:00 via INTRAVENOUS

## 2017-05-04 MED ORDER — SODIUM CHLORIDE 0.9 % IV SOLN
2.0000 g | Freq: Once | INTRAVENOUS | Status: AC
  Administered 2017-05-04: 2 g via INTRAVENOUS
  Filled 2017-05-04: qty 2

## 2017-05-04 MED ORDER — SODIUM CHLORIDE 0.9 % IV SOLN
1250.0000 mg | Freq: Once | INTRAVENOUS | Status: AC
  Administered 2017-05-05: 1250 mg via INTRAVENOUS
  Filled 2017-05-04: qty 1250

## 2017-05-04 MED ORDER — VANCOMYCIN HCL IN DEXTROSE 1-5 GM/200ML-% IV SOLN: 1000.0000 mg | Freq: Once | INTRAVENOUS | Status: DC

## 2017-05-04 NOTE — ED Triage Notes (Signed)
Pt arrived by EMS from [facility] for SOB. Initial sats 74%. Pt received 125mg  solumedrol, 10mg  Atrobulterol, and  0.5 mg atrovent. Recently admitted for small bowel obstruction. Hx of frequent UTI

## 2017-05-04 NOTE — ED Notes (Signed)
Sepsis @ 2243  (RN aware)

## 2017-05-04 NOTE — ED Notes (Signed)
RN - [name], states that pt needs catheter

## 2017-05-05 ENCOUNTER — Other Ambulatory Visit: Payer: Self-pay

## 2017-05-05 ENCOUNTER — Encounter (HOSPITAL_COMMUNITY): Payer: Self-pay

## 2017-05-05 DIAGNOSIS — J441 Chronic obstructive pulmonary disease with (acute) exacerbation: Secondary | ICD-10-CM | POA: Diagnosis present

## 2017-05-05 DIAGNOSIS — E785 Hyperlipidemia, unspecified: Secondary | ICD-10-CM | POA: Diagnosis present

## 2017-05-05 DIAGNOSIS — K219 Gastro-esophageal reflux disease without esophagitis: Secondary | ICD-10-CM | POA: Diagnosis present

## 2017-05-05 DIAGNOSIS — R0902 Hypoxemia: Secondary | ICD-10-CM

## 2017-05-05 DIAGNOSIS — I482 Chronic atrial fibrillation: Secondary | ICD-10-CM | POA: Diagnosis not present

## 2017-05-05 DIAGNOSIS — J440 Chronic obstructive pulmonary disease with acute lower respiratory infection: Secondary | ICD-10-CM | POA: Diagnosis present

## 2017-05-05 DIAGNOSIS — J181 Lobar pneumonia, unspecified organism: Secondary | ICD-10-CM | POA: Diagnosis present

## 2017-05-05 DIAGNOSIS — E119 Type 2 diabetes mellitus without complications: Secondary | ICD-10-CM

## 2017-05-05 DIAGNOSIS — E876 Hypokalemia: Secondary | ICD-10-CM | POA: Diagnosis present

## 2017-05-05 DIAGNOSIS — Z950 Presence of cardiac pacemaker: Secondary | ICD-10-CM | POA: Diagnosis not present

## 2017-05-05 DIAGNOSIS — I5032 Chronic diastolic (congestive) heart failure: Secondary | ICD-10-CM

## 2017-05-05 DIAGNOSIS — E1151 Type 2 diabetes mellitus with diabetic peripheral angiopathy without gangrene: Secondary | ICD-10-CM | POA: Diagnosis present

## 2017-05-05 DIAGNOSIS — F0390 Unspecified dementia without behavioral disturbance: Secondary | ICD-10-CM | POA: Diagnosis present

## 2017-05-05 DIAGNOSIS — Y95 Nosocomial condition: Secondary | ICD-10-CM | POA: Diagnosis present

## 2017-05-05 DIAGNOSIS — R6520 Severe sepsis without septic shock: Secondary | ICD-10-CM | POA: Diagnosis present

## 2017-05-05 DIAGNOSIS — A419 Sepsis, unspecified organism: Secondary | ICD-10-CM | POA: Diagnosis present

## 2017-05-05 DIAGNOSIS — Z87891 Personal history of nicotine dependence: Secondary | ICD-10-CM | POA: Diagnosis not present

## 2017-05-05 DIAGNOSIS — Z89511 Acquired absence of right leg below knee: Secondary | ICD-10-CM | POA: Diagnosis not present

## 2017-05-05 DIAGNOSIS — J9601 Acute respiratory failure with hypoxia: Secondary | ICD-10-CM | POA: Diagnosis not present

## 2017-05-05 DIAGNOSIS — Z66 Do not resuscitate: Secondary | ICD-10-CM | POA: Diagnosis present

## 2017-05-05 DIAGNOSIS — Z85820 Personal history of malignant melanoma of skin: Secondary | ICD-10-CM | POA: Diagnosis not present

## 2017-05-05 DIAGNOSIS — N179 Acute kidney failure, unspecified: Secondary | ICD-10-CM | POA: Diagnosis not present

## 2017-05-05 DIAGNOSIS — Z833 Family history of diabetes mellitus: Secondary | ICD-10-CM | POA: Diagnosis not present

## 2017-05-05 DIAGNOSIS — J189 Pneumonia, unspecified organism: Secondary | ICD-10-CM | POA: Diagnosis not present

## 2017-05-05 DIAGNOSIS — R0602 Shortness of breath: Secondary | ICD-10-CM | POA: Diagnosis not present

## 2017-05-05 DIAGNOSIS — G4730 Sleep apnea, unspecified: Secondary | ICD-10-CM | POA: Diagnosis present

## 2017-05-05 DIAGNOSIS — I5043 Acute on chronic combined systolic (congestive) and diastolic (congestive) heart failure: Secondary | ICD-10-CM | POA: Diagnosis present

## 2017-05-05 DIAGNOSIS — Z87440 Personal history of urinary (tract) infections: Secondary | ICD-10-CM | POA: Diagnosis not present

## 2017-05-05 DIAGNOSIS — I150 Renovascular hypertension: Secondary | ICD-10-CM

## 2017-05-05 LAB — RESPIRATORY PANEL BY PCR
ADENOVIRUS-RVPPCR: NOT DETECTED
Bordetella pertussis: NOT DETECTED
CORONAVIRUS HKU1-RVPPCR: NOT DETECTED
CORONAVIRUS NL63-RVPPCR: NOT DETECTED
Chlamydophila pneumoniae: NOT DETECTED
Coronavirus 229E: NOT DETECTED
Coronavirus OC43: NOT DETECTED
INFLUENZA A-RVPPCR: NOT DETECTED
INFLUENZA B-RVPPCR: NOT DETECTED
METAPNEUMOVIRUS-RVPPCR: NOT DETECTED
Mycoplasma pneumoniae: NOT DETECTED
PARAINFLUENZA VIRUS 2-RVPPCR: NOT DETECTED
PARAINFLUENZA VIRUS 3-RVPPCR: NOT DETECTED
Parainfluenza Virus 1: NOT DETECTED
Parainfluenza Virus 4: NOT DETECTED
RHINOVIRUS / ENTEROVIRUS - RVPPCR: NOT DETECTED
Respiratory Syncytial Virus: NOT DETECTED

## 2017-05-05 LAB — URINALYSIS, ROUTINE W REFLEX MICROSCOPIC
BILIRUBIN URINE: NEGATIVE
Bacteria, UA: NONE SEEN
Glucose, UA: NEGATIVE mg/dL
Hgb urine dipstick: NEGATIVE
KETONES UR: NEGATIVE mg/dL
Leukocytes, UA: NEGATIVE
Nitrite: NEGATIVE
PH: 5 (ref 5.0–8.0)
Protein, ur: 30 mg/dL — AB
Specific Gravity, Urine: 1.02 (ref 1.005–1.030)

## 2017-05-05 LAB — PROTIME-INR
INR: 1.8
PROTHROMBIN TIME: 20.7 s — AB (ref 11.4–15.2)

## 2017-05-05 LAB — I-STAT CG4 LACTIC ACID, ED: Lactic Acid, Venous: 1.37 mmol/L (ref 0.5–1.9)

## 2017-05-05 LAB — BASIC METABOLIC PANEL
ANION GAP: 11 (ref 5–15)
BUN: 39 mg/dL — ABNORMAL HIGH (ref 6–20)
CO2: 20 mmol/L — ABNORMAL LOW (ref 22–32)
Calcium: 8 mg/dL — ABNORMAL LOW (ref 8.9–10.3)
Chloride: 103 mmol/L (ref 101–111)
Creatinine, Ser: 1.32 mg/dL — ABNORMAL HIGH (ref 0.44–1.00)
GFR calc Af Amer: 44 mL/min — ABNORMAL LOW (ref >60)
GFR, EST NON AFRICAN AMERICAN: 38 mL/min — AB (ref >60)
Glucose, Bld: 198 mg/dL — ABNORMAL HIGH (ref 65–99)
POTASSIUM: 3.8 mmol/L (ref 3.5–5.1)
SODIUM: 134 mmol/L — AB (ref 135–145)

## 2017-05-05 LAB — CBG MONITORING, ED
GLUCOSE-CAPILLARY: 217 mg/dL — AB (ref 65–99)
GLUCOSE-CAPILLARY: 196 mg/dL — AB (ref 65–99)
Glucose-Capillary: 232 mg/dL — ABNORMAL HIGH (ref 65–99)

## 2017-05-05 LAB — TYPE AND SCREEN
ABO/RH(D): A NEG
Antibody Screen: NEGATIVE

## 2017-05-05 LAB — GLUCOSE, CAPILLARY
GLUCOSE-CAPILLARY: 177 mg/dL — AB (ref 65–99)
GLUCOSE-CAPILLARY: 200 mg/dL — AB (ref 65–99)

## 2017-05-05 LAB — HIV ANTIBODY (ROUTINE TESTING W REFLEX): HIV Screen 4th Generation wRfx: NONREACTIVE

## 2017-05-05 LAB — STREP PNEUMONIAE URINARY ANTIGEN: STREP PNEUMO URINARY ANTIGEN: NEGATIVE

## 2017-05-05 LAB — APTT: aPTT: 47 seconds — ABNORMAL HIGH (ref 24–36)

## 2017-05-05 MED ORDER — OXYCODONE-ACETAMINOPHEN 5-325 MG PO TABS: 1.0000 | ORAL_TABLET | ORAL | Status: DC | PRN

## 2017-05-05 MED ORDER — IPRATROPIUM-ALBUTEROL 0.5-2.5 (3) MG/3ML IN SOLN: 3.0000 mL | Freq: Four times a day (QID) | RESPIRATORY_TRACT | Status: DC | PRN

## 2017-05-05 MED ORDER — LEVOTHYROXINE SODIUM 25 MCG PO TABS
25.0000 ug | ORAL_TABLET | Freq: Every day | ORAL | Status: DC
  Administered 2017-05-05 – 2017-05-07 (×3): 25 ug via ORAL
  Filled 2017-05-05 (×3): qty 1

## 2017-05-05 MED ORDER — SODIUM CHLORIDE 0.9 % IV SOLN
INTRAVENOUS | Status: DC
  Administered 2017-05-05: 03:00:00 via INTRAVENOUS

## 2017-05-05 MED ORDER — VANCOMYCIN HCL IN DEXTROSE 750-5 MG/150ML-% IV SOLN
750.0000 mg | INTRAVENOUS | Status: DC
  Administered 2017-05-05: 750 mg via INTRAVENOUS
  Filled 2017-05-05: qty 150

## 2017-05-05 MED ORDER — SODIUM CHLORIDE 0.9 % IV SOLN
1.0000 g | INTRAVENOUS | Status: DC
  Administered 2017-05-05: 1 g via INTRAVENOUS
  Filled 2017-05-05: qty 1

## 2017-05-05 MED ORDER — MOMETASONE FURO-FORMOTEROL FUM 200-5 MCG/ACT IN AERO
2.0000 | INHALATION_SPRAY | Freq: Two times a day (BID) | RESPIRATORY_TRACT | Status: DC
  Administered 2017-05-05 – 2017-05-06 (×3): 2 via RESPIRATORY_TRACT
  Filled 2017-05-05 (×2): qty 8.8

## 2017-05-05 MED ORDER — ACETAMINOPHEN 500 MG PO TABS
1000.0000 mg | ORAL_TABLET | Freq: Once | ORAL | Status: AC
  Administered 2017-05-05: 1000 mg via ORAL
  Filled 2017-05-05: qty 2

## 2017-05-05 MED ORDER — TRAZODONE HCL 50 MG PO TABS
50.0000 mg | ORAL_TABLET | Freq: Every day | ORAL | Status: DC
  Administered 2017-05-05 – 2017-05-06 (×2): 50 mg via ORAL
  Filled 2017-05-05 (×2): qty 1

## 2017-05-05 MED ORDER — PANTOPRAZOLE SODIUM 40 MG PO TBEC
80.0000 mg | DELAYED_RELEASE_TABLET | Freq: Every day | ORAL | Status: DC
  Administered 2017-05-05 – 2017-05-07 (×3): 80 mg via ORAL
  Filled 2017-05-05 (×4): qty 2

## 2017-05-05 MED ORDER — ATORVASTATIN CALCIUM 40 MG PO TABS
40.0000 mg | ORAL_TABLET | Freq: Every evening | ORAL | Status: DC
  Administered 2017-05-05 – 2017-05-06 (×2): 40 mg via ORAL
  Filled 2017-05-05 (×3): qty 1

## 2017-05-05 MED ORDER — DULOXETINE HCL 60 MG PO CPEP
60.0000 mg | ORAL_CAPSULE | Freq: Every day | ORAL | Status: DC
  Administered 2017-05-05 – 2017-05-07 (×3): 60 mg via ORAL
  Filled 2017-05-05 (×3): qty 1

## 2017-05-05 MED ORDER — SENNOSIDES-DOCUSATE SODIUM 8.6-50 MG PO TABS
1.0000 | ORAL_TABLET | Freq: Two times a day (BID) | ORAL | Status: DC
  Administered 2017-05-05 – 2017-05-06 (×3): 1 via ORAL
  Filled 2017-05-05 (×3): qty 1

## 2017-05-05 MED ORDER — INSULIN ASPART 100 UNIT/ML ~~LOC~~ SOLN
0.0000 [IU] | SUBCUTANEOUS | Status: DC
  Administered 2017-05-05: 3 [IU] via SUBCUTANEOUS
  Administered 2017-05-05: 2 [IU] via SUBCUTANEOUS
  Administered 2017-05-05: 3 [IU] via SUBCUTANEOUS
  Filled 2017-05-05 (×3): qty 1

## 2017-05-05 MED ORDER — WARFARIN - PHARMACIST DOSING INPATIENT
Freq: Every day | Status: DC
  Administered 2017-05-06: 18:00:00

## 2017-05-05 MED ORDER — GUAIFENESIN-DM 100-10 MG/5ML PO SYRP: 15.0000 mL | ORAL_SOLUTION | ORAL | Status: DC | PRN

## 2017-05-05 MED ORDER — POLYETHYLENE GLYCOL 3350 17 G PO PACK
17.0000 g | PACK | Freq: Two times a day (BID) | ORAL | Status: DC
  Administered 2017-05-05 – 2017-05-06 (×3): 17 g via ORAL
  Filled 2017-05-05 (×3): qty 1

## 2017-05-05 MED ORDER — LIDOCAINE 5 % EX PTCH
1.0000 | MEDICATED_PATCH | Freq: Two times a day (BID) | CUTANEOUS | Status: DC
  Administered 2017-05-05 – 2017-05-07 (×6): 1 via TRANSDERMAL
  Filled 2017-05-05 (×6): qty 1

## 2017-05-05 MED ORDER — ALPRAZOLAM 0.25 MG PO TABS: 0.2500 mg | ORAL_TABLET | Freq: Three times a day (TID) | ORAL | Status: DC | PRN

## 2017-05-05 MED ORDER — GABAPENTIN 300 MG PO CAPS
300.0000 mg | ORAL_CAPSULE | Freq: Every day | ORAL | Status: DC
  Administered 2017-05-05 – 2017-05-06 (×2): 300 mg via ORAL
  Filled 2017-05-05 (×2): qty 1

## 2017-05-05 MED ORDER — SODIUM CHLORIDE 0.9 % IV SOLN
INTRAVENOUS | Status: DC
  Administered 2017-05-05 – 2017-05-06 (×2): via INTRAVENOUS

## 2017-05-05 MED ORDER — POLYSACCHARIDE IRON COMPLEX 150 MG PO CAPS
150.0000 mg | ORAL_CAPSULE | Freq: Every day | ORAL | Status: DC
  Administered 2017-05-05 – 2017-05-07 (×3): 150 mg via ORAL
  Filled 2017-05-05 (×3): qty 1

## 2017-05-05 MED ORDER — INSULIN ASPART 100 UNIT/ML ~~LOC~~ SOLN
0.0000 [IU] | Freq: Three times a day (TID) | SUBCUTANEOUS | Status: DC
  Administered 2017-05-05: 3 [IU] via SUBCUTANEOUS
  Administered 2017-05-06 (×2): 5 [IU] via SUBCUTANEOUS
  Administered 2017-05-06: 8 [IU] via SUBCUTANEOUS
  Administered 2017-05-07 (×2): 3 [IU] via SUBCUTANEOUS

## 2017-05-05 MED ORDER — WARFARIN SODIUM 3 MG PO TABS
6.0000 mg | ORAL_TABLET | Freq: Once | ORAL | Status: AC
  Administered 2017-05-05: 6 mg via ORAL
  Filled 2017-05-05: qty 2
  Filled 2017-05-05: qty 1

## 2017-05-05 MED ORDER — MONTELUKAST SODIUM 10 MG PO TABS
10.0000 mg | ORAL_TABLET | Freq: Every day | ORAL | Status: DC
  Administered 2017-05-05 – 2017-05-06 (×3): 10 mg via ORAL
  Filled 2017-05-05 (×4): qty 1

## 2017-05-05 MED ORDER — MELATONIN 3 MG PO TABS
4.5000 mg | ORAL_TABLET | Freq: Every day | ORAL | Status: DC
  Administered 2017-05-05 – 2017-05-06 (×3): 4.5 mg via ORAL
  Filled 2017-05-05 (×4): qty 1.5

## 2017-05-05 MED ORDER — INSULIN ASPART 100 UNIT/ML ~~LOC~~ SOLN: 0.0000 [IU] | Freq: Every day | SUBCUTANEOUS | Status: DC

## 2017-05-05 MED ORDER — ASPIRIN EC 81 MG PO TBEC
81.0000 mg | DELAYED_RELEASE_TABLET | Freq: Every day | ORAL | Status: DC
  Administered 2017-05-05 – 2017-05-07 (×3): 81 mg via ORAL
  Filled 2017-05-05 (×3): qty 1

## 2017-05-05 MED ORDER — ACETAMINOPHEN 325 MG PO TABS
650.0000 mg | ORAL_TABLET | Freq: Four times a day (QID) | ORAL | Status: DC | PRN
  Administered 2017-05-06: 650 mg via ORAL
  Filled 2017-05-05: qty 2

## 2017-05-05 MED ORDER — ALBUTEROL SULFATE (2.5 MG/3ML) 0.083% IN NEBU: 2.5000 mg | INHALATION_SOLUTION | RESPIRATORY_TRACT | Status: DC | PRN

## 2017-05-05 NOTE — ED Provider Notes (Signed)
[facility] EMERGENCY DEPARTMENT Provider Note   CSN: [id number] Arrival date & time: 05/04/17  2238     History   Chief Complaint Chief Complaint  Patient presents with  . Shortness of Breath    HPI Sarah Moss is a 77 y.o. female with a hx of asthma, a-fib (on coumadin), CHF, COPD, dementia, NIDDM, HTN, pacemaker, sleep apnea presents to the Emergency Department via EMS with altered mental status and respiratory distress.    Level 5 caveat for altered mental status.  Per EMS, family found patient altered and in respiratory distress at nursing facility and initiated 911 call.  Upon EMS arrival, patient oxygen saturations 79% on room air, respiratory distress and moving minimal air.  Patient was given albuterol 10 mg, Atrovent 0.5 mg and Solu-Medrol 125 mg prior to arrival with some improvement.  Patient hot to touch on their arrival but without hypotension.  Paperwork from facility reviewed.  Patient is anticoagulated on Coumadin.  Included in paperwork is a chest x-ray from earlier today which shows left-sided infiltrate.  No antibiotics are listed on her MAR.  Patient is a non-insulin-dependent diabetic.  Family at bedside reports that patient is normally alert and answers questions.  They report that she has oxygen ordered for respiratory distress as needed but that she does not normally wear this.  They state that tonight she was altered upon their arrival, peer to have difficulty breathing and was an abnormal color.  They are unable to be any more specific.  Long discussion with patient's sister who is power of attorney.  Patient does have DNR however they wish for intubation if her respiratory distress increases.  They do not want chest compressions or defibrillations.   The history is provided by the EMS personnel, a relative and medical records. No language interpreter was used.    Past Medical History:  Diagnosis Date  . Asthma   . Atrial  fibrillation ([place])   . CHF (congestive heart failure) ([place])   . COPD (chronic obstructive pulmonary disease) ([place])   . Dementia   . Diabetes mellitus without complication ([place])   . GERD (gastroesophageal reflux disease)   . Hx of right BKA ([place])   . Hyperlipidemia   . Hypertension   . Melanoma ([place])    chest wall  . Pacemaker   . PAD (peripheral artery disease) (HCC)    L SFA, RCIA, LCIA stents  . Sleep apnea    documented in pt history  . Tachycardia-bradycardia syndrome [facility])     Patient Active Problem List   Diagnosis Date Noted  . HCAP (healthcare-associated pneumonia) 05/05/2017  . AKI (acute kidney injury) ([place]) 05/05/2017  . Critical lower limb ischemia 02/06/2017  . Ileus ([place]) 07/24/2016  . Permanent atrial fibrillation ([place]) 05/10/2016  . Status post below knee amputation of right lower extremity ([place]) 05/10/2016  . Longstanding persistent atrial fibrillation ([place]) 04/02/2016  . CHB (complete heart block) (HCC) s/p AV node ablation 04/02/2016  . Chronic anticoagulation 03/22/2016  . Microcytic anemia 03/22/2016  . Diabetes mellitus with hemoglobin A1c goal of 7.0%-8.0% ([place])   . Cardiomyopathy ([place]) 03/21/2016  . PAD (peripheral artery disease) ([place])   . Chronic atrial fibrillation ([place]) 03/18/2016  . Amputee, below knee ([place]) 03/18/2016  . Congestive heart failure ([place]) 03/18/2016  . Pulmonary emphysema ([place]) 03/18/2016  . GERD (gastroesophageal reflux disease) 03/18/2016  . Acute systolic heart failure ([place]) 03/18/2016  . Bilateral pleural effusion 02/24/2015  . S/P AV nodal ablation  02/21/2015  . Cardiac pacemaker in situ 02/20/2015  . Urticaria 02/20/2015  . Type 2 diabetes mellitus with complication ([place]) 57/84/6962  . Tachy-brady syndrome ([place]) 12/19/2014  . Normal left ventricular systolic function and wall motion 10/12/2014  . Benign essential hypertension 09/14/2014  . Dyspnea 09/14/2014  . Long-term use of aspirin therapy 09/14/2014  . Allergic  rhinitis 08/03/2014  . DOE (dyspnea on exertion) 08/03/2014  . Pulmonary HTN ([place]) 08/03/2014  . Sleep apnea 08/03/2014  . Sleep apnea with use of continuous positive airway pressure (CPAP) 08/03/2014  . Chronic diastolic CHF (congestive heart failure) ([place]) 05/30/2014  . HTN (hypertension) 11/04/2012  . Hypothyroidism 11/04/2012  . Type 2 diabetes mellitus with diabetic polyneuropathy ([place]) 11/04/2012  . Red blood cell antibody positive 10/28/2011  . Arthropathy 08/17/2009  . Mixed incontinence 08/17/2009  . Benign renovascular hypertension 08/17/2009  . Anxiety state 06/05/2009  . Abnormal finding on thyroid function test 06/01/2009  . Vitamin D deficiency 06/01/2009  . Type II or unspecified type diabetes mellitus without mention of complication, not stated as uncontrolled 05/26/2009  . Asthma 05/24/2009  . Chronic kidney disease, stage III (moderate) ([place]) 05/24/2009  . Depressive disorder, not elsewhere classified 05/24/2009  . Insomnia 05/24/2009  . Osteoarthritis 05/24/2009  . Osteoporosis 05/24/2009  . Peripheral vascular disease ([place]) 05/24/2009    Past Surgical History:  Procedure Laterality Date  . ABDOMINAL AORTOGRAM W/LOWER EXTREMITY N/A 02/06/2017   Procedure: ABDOMINAL AORTOGRAM W/LOWER EXTREMITY;  Surgeon: Conrad [place], MD;  Location: [facility] CV LAB;  Service: Cardiovascular;  Laterality: N/A;  . LOWER EXTREMITY ANGIOGRAPHY N/A 03/19/2016   Procedure: Lower Extremity Angiography;  Surgeon: Serafina Mitchell, MD;  Location: [facility];  Service: Cardiovascular;  Laterality: N/A;  . MELANOMA EXCISION N/A 12/12/2016   Procedure: WIDE LOCAL EXCISION WITH ADVANCEMENT FLAP CLOSURE FOR CHEST WALL MELANOMA;  Surgeon: Stark Klein, MD;  Location: [facility];  Service: General;  Laterality: N/A;  . PERIPHERAL VASCULAR INTERVENTION Bilateral 03/19/2016   Procedure: Peripheral Vascular Intervention;  Surgeon: Serafina Mitchell, MD;  Location: [facility] CV LAB;  Service:  Cardiovascular;  Laterality: Bilateral;  iliacs  . PERIPHERAL VASCULAR INTERVENTION Right 02/06/2017   Procedure: PERIPHERAL VASCULAR INTERVENTION;  Surgeon: Conrad [place], MD;  Location: [facility] CV LAB;  Service: Cardiovascular;  Laterality: Right;  external iliac     OB History   None      Home Medications    Prior to Admission medications   Medication Sig Start Date End Date Taking? Authorizing Provider  ADVAIR DISKUS 250-50 MCG/DOSE AEPB Inhale 1 puff into the lungs 2 (two) times daily. 03/08/16  Yes [provider]  ALPRAZolam (XANAX) 0.25 MG tablet Take 0.25 mg by mouth 3 (three) times daily as needed for anxiety.   Yes [provider]  aspirin EC 81 MG EC tablet Take 1 tablet (81 mg total) by mouth daily. 03/24/16  Yes Eugenie Filler, MD  atorvastatin (LIPITOR) 40 MG tablet Take 40 mg by mouth every evening.  05/30/14  Yes [provider]  carvedilol (COREG) 12.5 MG tablet Take 1 tablet (12.5 mg total) by mouth 2 (two) times daily with a meal. 05/27/16  Yes Isaiah Serge, NP  DULoxetine (CYMBALTA) 60 MG capsule Take 60 mg by mouth daily.   Yes [provider]  furosemide (LASIX) 40 MG tablet Take 40 mg by mouth daily.   Yes [provider]  gabapentin (NEURONTIN) 300 MG capsule Take 300 mg  by mouth at bedtime. 05/30/14  Yes [provider]  guaiFENesin-dextromethorphan (ROBITUSSIN DM) 100-10 MG/5ML syrup Take 15 mLs by mouth every 4 (four) hours as needed for cough.   Yes [provider]  ipratropium-albuterol (DUONEB) 0.5-2.5 (3) MG/3ML SOLN Take 3 mLs by nebulization every 6 (six) hours as needed (for cough and wheezing).   Yes [provider]  iron polysaccharides (NIFEREX) 150 MG capsule Take 1 capsule (150 mg total) by mouth daily. 03/24/16  Yes Eugenie Filler, MD  levothyroxine (SYNTHROID, LEVOTHROID) 25 MCG tablet Take 25 mcg by mouth daily. 05/30/14  Yes [provider]  lidocaine  (LIDODERM) 5 % Place 1 patch onto the skin every 12 (twelve) hours. Remove & Discard patch within 12 hours or as directed by MD   Yes [provider]  lisinopril (PRINIVIL,ZESTRIL) 20 MG tablet Take 20 mg by mouth daily.   Yes [provider]  Melatonin 5 MG TABS Take 5 mg by mouth at bedtime.   Yes [provider]  metFORMIN (GLUCOPHAGE) 1000 MG tablet Take 1,000 mg by mouth 2 (two) times daily. 05/30/14  Yes [provider]  montelukast (SINGULAIR) 10 MG tablet Take 10 mg by mouth at bedtime.   Yes [provider]  nitroGLYCERIN (NITROSTAT) 0.4 MG SL tablet Place 0.4 mg under the tongue every 5 (five) minutes as needed for chest pain.   Yes [provider]  omeprazole (PRILOSEC) 40 MG capsule Take 40 mg by mouth daily.   Yes [provider]  oxyCODONE-acetaminophen (PERCOCET/ROXICET) 5-325 MG tablet Take 1 tablet by mouth every 6 (six) hours as needed for severe pain. Patient taking differently: Take 1 tablet by mouth every 4 (four) hours as needed for severe pain.  07/28/16  Yes Oswald Hillock, MD  oxymetazoline (AFRIN) 0.05 % nasal spray Place 2 sprays into both nostrils daily.    Yes [provider]  oxymetazoline (AFRIN) 0.05 % nasal spray Place 1 spray into both nostrils every hour as needed (for dryness or nosebleed).   Yes [provider]  polyethylene glycol (MIRALAX / GLYCOLAX) packet Take 17 g by mouth 2 (two) times daily. 03/23/16  Yes Eugenie Filler, MD  senna-docusate (SENOKOT-S) 8.6-50 MG tablet Take 1 tablet by mouth 2 (two) times daily. 03/23/16  Yes Eugenie Filler, MD  traZODone (DESYREL) 50 MG tablet Take 50 mg by mouth at bedtime.   Yes [provider]  Vitamin D, Ergocalciferol, (DRISDOL) 50000 units CAPS capsule Take 50,000 Units by mouth 2 (two) times a week. On Tuesday and Friday   Yes [provider]  warfarin (COUMADIN) 4 MG tablet Take 4 mg by mouth daily.   Yes [provider]    Family History Family History  Problem Relation Age of Onset  . Stroke Mother   . Diabetes Mother   . Cancer Father     Social History Social History   Tobacco Use  . Smoking status: Former Research scientist (life sciences)  . Smokeless tobacco: Never Used  Substance Use Topics  . Alcohol use: No  . Drug use: No     Allergies   Ambien [zolpidem]; Ativan [lorazepam]; and Codeine   Review of Systems Review of Systems  Unable to perform ROS: Mental status change  Respiratory: Positive for shortness of breath.      Physical Exam Updated Vital Signs BP (!) 148/50   Pulse 69   Temp (!) 101.5 F (38.6 C) (Rectal)   Resp (!) 25  LMP  (LMP Unknown)   SpO2 95%   Physical Exam  Constitutional: She appears well-developed and well-nourished. She appears lethargic. No distress.  Lethargic  HENT:  Head: Normocephalic and atraumatic.  Right Ear: External ear normal.  Left Ear: External ear normal.  Nose: No rhinorrhea. No epistaxis.  Mouth/Throat: Mucous membranes are dry.  Eyes: Conjunctivae are normal. Right eye exhibits discharge. Left eye exhibits discharge. No scleral icterus.  Neck: Normal range of motion. Neck supple.  Cardiovascular: Normal rate, regular rhythm and intact distal pulses.  Pulses:      Radial pulses are 1+ on the right side, and 1+ on the left side.       Dorsalis pedis pulses are 1+ on the left side. Right dorsalis pedis pulse not accessible.       Right posterior tibial pulse not accessible.  Pulmonary/Chest: Accessory muscle usage present. Tachypnea noted. She is in respiratory distress. She has decreased breath sounds in the left middle field and the left lower field. She has wheezes in the right upper field, the right middle field and the left upper field. She has rales in the left middle field and the left lower field.  Equal chest expansion Wet and congested cough Nebulizer running  Abdominal: Soft. Bowel sounds are normal. She exhibits no  distension and no mass. There is no tenderness. There is no guarding.  Musculoskeletal: Normal range of motion. She exhibits no edema.  Right BKA  Neurological: She appears lethargic.  Patient does not follow commands, moves all extremities Moaning and stating "Oh God"  Skin: Skin is warm and dry. She is not diaphoretic. There is pallor.  Hot to touch  Psychiatric: Her mood appears anxious.  Nursing note and vitals reviewed.    ED Treatments / Results  Labs (all labs ordered are listed, but only abnormal results are displayed) Labs Reviewed  COMPREHENSIVE METABOLIC PANEL - Abnormal; Notable for the following components:      Result Value   CO2 18 (*)    Glucose, Bld 161 (*)    BUN 37 (*)    Creatinine, Ser 1.58 (*)    Calcium 8.3 (*)    Albumin 2.7 (*)    ALT 11 (*)    Total Bilirubin 1.4 (*)    GFR calc non Af Amer 30 (*)    GFR calc Af Amer 35 (*)    Anion gap 16 (*)    All other components within normal limits  CBC WITH DIFFERENTIAL/PLATELET - Abnormal; Notable for the following components:   WBC 14.5 (*)    RBC 3.54 (*)    Hemoglobin 9.6 (*)    HCT 29.5 (*)    RDW 16.2 (*)    Neutro Abs 12.2 (*)    All other components within normal limits  URINALYSIS, ROUTINE W REFLEX MICROSCOPIC - Abnormal; Notable for the following components:   Color, Urine AMBER (*)    APPearance HAZY (*)    Protein, ur 30 (*)    Squamous Epithelial / LPF 0-5 (*)    All other components within normal limits  BRAIN NATRIURETIC PEPTIDE - Abnormal; Notable for the following components:   B Natriuretic Peptide 1,393.6 (*)    All other components within normal limits  PROTIME-INR - Abnormal; Notable for the following components:   Prothrombin Time 19.0 (*)    All other components within normal limits  APTT - Abnormal; Notable for the following components:   aPTT 47 (*)    All other components  within normal limits  CULTURE, BLOOD (ROUTINE X 2)  CULTURE, BLOOD (ROUTINE X 2)  URINE CULTURE    CULTURE, EXPECTORATED SPUTUM-ASSESSMENT  GRAM STAIN  RESPIRATORY PANEL BY PCR  HIV ANTIBODY (ROUTINE TESTING)  STREP PNEUMONIAE URINARY ANTIGEN  BASIC METABOLIC PANEL  I-STAT CG4 LACTIC ACID, ED  I-STAT TROPONIN, ED  I-STAT CG4 LACTIC ACID, ED  TYPE AND SCREEN    EKG EKG Interpretation  Date/Time:  Sunday May 04 2017 22:57:59 EDT Ventricular Rate:  70 PR Interval:    QRS Duration: 138 QT Interval:  434 QTC Calculation: 469 R Axis:   -58 Text Interpretation:  Ventricular-paced rhythm No further analysis attempted due to paced rhythm Baseline wander in lead(s) II III aVF V1 V3 V4 V5 No significant change since last tracing Confirmed by Pryor Curia [phone]) on 05/04/2017 11:24:56 PM   Radiology Dg Chest Port 1 View  Result Date: 05/04/2017 CLINICAL DATA:  Respiratory distress EXAM: PORTABLE CHEST 1 VIEW COMPARISON:  05/02/2017 FINDINGS: Cardiac shadow is enlarged. Pacing device is again seen and stable. Aortic calcifications are again noted. Increasing infiltrative change is noted emanating from the left hilum likely within the left upper lobe. Right lung remains clear. Increasing left-sided pleural effusion is noted. IMPRESSION: Increasing left perihilar infiltrate and pleural effusion. Electronically Signed   By: Inez Catalina M.D.   On: 05/04/2017 22:51    Procedures Procedures (including critical care time)  Medications Ordered in ED Medications  vancomycin (VANCOCIN) 1,250 mg in sodium chloride 0.9 % 250 mL IVPB (1,250 mg Intravenous New Bag/Given 05/05/17 0058)  0.9 %  sodium chloride infusion (has no administration in time range)  ceFEPIme (MAXIPIME) 1 g in sodium chloride 0.9 % 100 mL IVPB (has no administration in time range)  insulin aspart (novoLOG) injection 0-9 Units (has no administration in time range)  ceFEPIme (MAXIPIME) 2 g in sodium chloride 0.9 % 100 mL IVPB (0 g Intravenous Stopped 05/05/17 0056)  0.9 %  sodium chloride infusion ( Intravenous New Bag/Given  05/05/17 0058)  acetaminophen (TYLENOL) tablet 1,000 mg (1,000 mg Oral Given 05/05/17 0104)     Initial Impression / Assessment and Plan / ED Course  I have reviewed the triage vital signs and the nursing notes.  Pertinent labs & imaging results that were available during my care of the patient were reviewed by me and considered in my medical decision making (see chart for details).  Clinical Course as of May 05 132  Mon May 05, 2017  0133 Pt to be admitted by Dr. Alcario Drought   [HM]    Clinical Course User Index [HM] Ica Daye, Jarrett Soho, [place]    Patient presents with history of dementia and worsened altered mental status per family at bedside.  She is a known pneumonia diagnosed earlier today.  She arrives in moderate respiratory distress febrile and tachypneic but without hypotension.  No tachycardia however patient has a pacemaker thus her reactive tachycardia would be blunted.    Patient completed albuterol treatment initiated by EMS.  Repeat evaluation with increased breath sounds in the right lobes with resolved wheezing.  Now with increased aeration of the left upper but decreased breath sounds and rales in the left lower.  Patient removed from Nebules her treatment and oxygen with oxygen saturations dipping to 87%.  She remains tachypneic.  Leukocytosis of 14.5.  Patient's anemia with a hemoglobin of 9.6 appears to be baseline.  She has a worsening serum creatinine at 1.58.  Additionally, her BNP is elevated at greater  than 1300.  Troponin is negative and EKG is unchanged.  Doubt acute coronary syndrome.  Sepsis protocol initiated upon arrival.  She does meet Sirs criteria and has a source, but likely has COPD exacerbation in addition.  Patient's lactic acid is normal.  Due to she is fluids given at 150 mL's per hour as patient is without hypotension, elevated lactic acid and has history of CHF.  No peripheral edema.  Antibiotics ordered.  Patient will need admission for new and persistent  hypoxia in the setting of healthcare acquired pneumonia.  Chest x-ray here in the emergency department confirms pneumonia.  I personally evaluated these images.  The patient was discussed with and seen by Dr. Leonides Schanz who agrees with the treatment plan.   Final Clinical Impressions(s) / ED Diagnoses   Final diagnoses:  Hypoxia  SOB (shortness of breath)  HCAP (healthcare-associated pneumonia)  COPD exacerbation [facility])    ED Discharge Orders    None       Jozeph Persing, Gwenlyn Perking 05/05/17 0134    Ward, Delice Bison, DO 05/05/17 0320

## 2017-05-05 NOTE — H&P (Signed)
History and Physical    Sarah Moss PNT:[id number] DOB: [birth year] DOA: 05/04/2017  PCP: Wenda Low, MD  Patient coming from: [facility]  I have personally briefly reviewed patient's old medical records in [place]  Chief Complaint: SOB  HPI: Sarah Moss is a 77 y.o. female with medical history significant of Dementia, HTN, DM2, A.Fib, PPM placement, R BKA, chronic coumadin.  Patient sent in from the SNF for report of SOB.  O2 sats initially 74% per EMS.  Patient initially lethargic / obtunded.  Put on O2, given 125 solumedrol, 10mg  albuterol, 0.5 Atrovent.  Recent admit for SBO.  H/o Frequent UTIs.  Seen in ED x2 days ago for questionable chest discomfort (that she later didn't even remember she had due to dementia).  Discharged home after obs for several hours and 2 negative troponins.   ED Course: Today in ED has fever 101.5.  WBC 14.5.  Creat 1.58 up from baseline of 0.9 with BUN 37.  Lactate nl.  BNP 1393.6.  Trop 0.05.  CXR shows L sided infiltrate and effusion.  R lung is clear.  Initially lethargic / obtunded.  Patient put on 150 cc/hr NS, O2, cefepime / vanc.  Mental status has improved significantly.  She is now awake and talking at the time of my evaluation, though she understandably states she doesn't feel very well.   Review of Systems: As per HPI otherwise 10 point review of systems negative.   Past Medical History:  Diagnosis Date  . Asthma   . Atrial fibrillation ([place])   . CHF (congestive heart failure) ([place])   . COPD (chronic obstructive pulmonary disease) ([place])   . Dementia   . Diabetes mellitus without complication ([place])   . GERD (gastroesophageal reflux disease)   . Hx of right BKA ([place])   . Hyperlipidemia   . Hypertension   . Melanoma ([place])    chest wall  . Pacemaker   . PAD (peripheral artery disease) (HCC)    L SFA, RCIA, LCIA stents  . Sleep apnea    documented in pt history  . Tachycardia-bradycardia syndrome [facility])      Past Surgical History:  Procedure Laterality Date  . ABDOMINAL AORTOGRAM W/LOWER EXTREMITY N/A 02/06/2017   Procedure: ABDOMINAL AORTOGRAM W/LOWER EXTREMITY;  Surgeon: Conrad Wilhoit, MD;  Location: [place] [facility];  Service: Cardiovascular;  Laterality: N/A;  . LOWER EXTREMITY ANGIOGRAPHY N/A 03/19/2016   Procedure: Lower Extremity Angiography;  Surgeon: Serafina Mitchell, MD;  Location: [place] [facility];  Service: Cardiovascular;  Laterality: N/A;  . MELANOMA EXCISION N/A 12/12/2016   Procedure: WIDE LOCAL EXCISION WITH ADVANCEMENT FLAP CLOSURE FOR CHEST WALL MELANOMA;  Surgeon: Stark Klein, MD;  Location: [place];  Service: General;  Laterality: N/A;  . PERIPHERAL VASCULAR INTERVENTION Bilateral 03/19/2016   Procedure: Peripheral Vascular Intervention;  Surgeon: Serafina Mitchell, MD;  Location: [place] [facility];  Service: Cardiovascular;  Laterality: Bilateral;  iliacs  . PERIPHERAL VASCULAR INTERVENTION Right 02/06/2017   Procedure: PERIPHERAL VASCULAR INTERVENTION;  Surgeon: Conrad [place], MD;  Location: [place] [facility];  Service: Cardiovascular;  Laterality: Right;  external iliac     reports that she has quit smoking. She has never used smokeless tobacco. She reports that she does not drink alcohol or use drugs.  Allergies  Allergen Reactions  . Ambien [Zolpidem] Other (See Comments)    "Has the opposite effect; makes me more alert/hyper"  . Ativan [Lorazepam] Other (See Comments)    "  Makes me confused and crazy"  . Codeine Itching and Nausea And Vomiting    Family History  Problem Relation Age of Onset  . Stroke Mother   . Diabetes Mother   . Cancer Father      Prior to Admission medications   Medication Sig Start Date End Date Taking? Authorizing Provider  ADVAIR DISKUS 250-50 MCG/DOSE AEPB Inhale 1 puff into the lungs 2 (two) times daily. 03/08/16  Yes [provider]  ALPRAZolam (XANAX) 0.25 MG tablet Take 0.25 mg by mouth 3 (three) times daily as needed  for anxiety.   Yes [provider]  aspirin EC 81 MG EC tablet Take 1 tablet (81 mg total) by mouth daily. 03/24/16  Yes Eugenie Filler, MD  atorvastatin (LIPITOR) 40 MG tablet Take 40 mg by mouth every evening.  05/30/14  Yes [provider]  carvedilol (COREG) 12.5 MG tablet Take 1 tablet (12.5 mg total) by mouth 2 (two) times daily with a meal. 05/27/16  Yes Isaiah Serge, NP  DULoxetine (CYMBALTA) 60 MG capsule Take 60 mg by mouth daily.   Yes [provider]  furosemide (LASIX) 40 MG tablet Take 40 mg by mouth daily.   Yes [provider]  gabapentin (NEURONTIN) 300 MG capsule Take 300 mg by mouth at bedtime. 05/30/14  Yes [provider]  guaiFENesin-dextromethorphan (ROBITUSSIN DM) 100-10 MG/5ML syrup Take 15 mLs by mouth every 4 (four) hours as needed for cough.   Yes [provider]  ipratropium-albuterol (DUONEB) 0.5-2.5 (3) MG/3ML SOLN Take 3 mLs by nebulization every 6 (six) hours as needed (for cough and wheezing).   Yes [provider]  iron polysaccharides (NIFEREX) 150 MG capsule Take 1 capsule (150 mg total) by mouth daily. 03/24/16  Yes Eugenie Filler, MD  levothyroxine (SYNTHROID, LEVOTHROID) 25 MCG tablet Take 25 mcg by mouth daily. 05/30/14  Yes [provider]  lidocaine (LIDODERM) 5 % Place 1 patch onto the skin every 12 (twelve) hours. Remove & Discard patch within 12 hours or as directed by MD   Yes [provider]  lisinopril (PRINIVIL,ZESTRIL) 20 MG tablet Take 20 mg by mouth daily.   Yes [provider]  Melatonin 5 MG TABS Take 5 mg by mouth at bedtime.   Yes [provider]  metFORMIN (GLUCOPHAGE) 1000 MG tablet Take 1,000 mg by mouth 2 (two) times daily. 05/30/14  Yes [provider]  montelukast (SINGULAIR) 10 MG tablet Take 10 mg by mouth at bedtime.   Yes [provider]  nitroGLYCERIN (NITROSTAT) 0.4 MG SL tablet Place 0.4 mg under the tongue every  5 (five) minutes as needed for chest pain.   Yes [provider]  omeprazole (PRILOSEC) 40 MG capsule Take 40 mg by mouth daily.   Yes [provider]  oxyCODONE-acetaminophen (PERCOCET/ROXICET) 5-325 MG tablet Take 1 tablet by mouth every 6 (six) hours as needed for severe pain. Patient taking differently: Take 1 tablet by mouth every 4 (four) hours as needed for severe pain.  07/28/16  Yes Oswald Hillock, MD  oxymetazoline (AFRIN) 0.05 % nasal spray Place 2 sprays into both nostrils daily.    Yes [provider]  oxymetazoline (AFRIN) 0.05 % nasal spray Place 1 spray into both nostrils every hour as needed (for dryness or nosebleed).   Yes [provider]  polyethylene glycol (MIRALAX / GLYCOLAX) packet Take 17 g by mouth 2 (two) times daily. 03/23/16  Yes Eugenie Filler, MD  senna-docusate (SENOKOT-S) 8.6-50 MG tablet Take 1 tablet by mouth 2 (two) times daily. 03/23/16  Yes Eugenie Filler, MD  traZODone (DESYREL) 50 MG tablet Take 50 mg by mouth at bedtime.   Yes [provider]  Vitamin D, Ergocalciferol, (DRISDOL) 50000 units CAPS capsule Take 50,000 Units by mouth 2 (two) times a week. On Tuesday and Friday   Yes [provider]  warfarin (COUMADIN) 4 MG tablet Take 4 mg by mouth daily.   Yes [provider]    Physical Exam: Vitals:   05/04/17 2330 05/05/17 0000 05/05/17 0030 05/05/17 0100  BP: (!) 148/50 (!) 147/53 (!) 133/49 138/60  Pulse: 69 74 77 72  Resp: (!) 25 (!) 23 (!) 23 (!) 24  Temp:      TempSrc:      SpO2: 95% 96% 97% 98%    Constitutional: NAD, calm, comfortable Eyes: PERRL, lids and conjunctivae normal ENMT: Mucous membranes are moist. Posterior pharynx clear of any exudate or lesions.Normal dentition.  Neck: normal, supple, no masses, no thyromegaly Respiratory: Wheezes audible from across room, crackles and wet breath sounds on L side, worse in base. Cardiovascular: Regular rate and rhythm, no  murmurs / rubs / gallops. No extremity edema. 2+ pedal pulses. No carotid bruits.  Abdomen: no tenderness, no masses palpated. No hepatosplenomegaly. Bowel sounds positive.  Musculoskeletal: no clubbing / cyanosis. No joint deformity upper and lower extremities. Good ROM, no contractures. Normal muscle tone.  Skin: no rashes, lesions, ulcers. No induration Neurologic: CN 2-12 grossly intact. Sensation intact, DTR normal. Strength 5/5 in all 4.  Psychiatric: Normal judgment and insight. Alert and oriented x 3. Normal mood.    Labs on Admission: I have personally reviewed following labs and imaging studies  CBC: Recent Labs  Lab 05/02/17 0146 05/04/17 2245  WBC 12.0* 14.5*  NEUTROABS  --  12.2*  HGB 9.9* 9.6*  HCT 30.8* 29.5*  MCV 83.5 83.3  PLT 174 884   Basic Metabolic Panel: Recent Labs  Lab 05/02/17 0146 05/04/17 2245  NA 133* 137  K 3.4* 4.1  CL 100* 103  CO2 19* 18*  GLUCOSE 136* 161*  BUN 18 37*  CREATININE 1.24* 1.58*  CALCIUM 8.2* 8.3*   GFR: Estimated Creatinine Clearance: 28.9 mL/min (A) (by C-G formula based on SCr of 1.58 mg/dL (H)). Liver Function Tests: Recent Labs  Lab 05/04/17 2245  AST 16  ALT 11*  ALKPHOS 68  BILITOT 1.4*  PROT 6.9  ALBUMIN 2.7*   No results for input(s): LIPASE, AMYLASE in the last 168 hours. No results for input(s): AMMONIA in the last 168 hours. Coagulation Profile: Recent Labs  Lab 05/04/17 2245  INR 1.61   Cardiac Enzymes: No results for input(s): CKTOTAL, CKMB, CKMBINDEX, TROPONINI in the last 168 hours. BNP (last 3 results) No results for input(s): PROBNP in the last 8760 hours. HbA1C: No results for input(s): HGBA1C in the last 72 hours. CBG: No results for input(s): GLUCAP in the last 168 hours. Lipid Profile: No results for input(s): CHOL, HDL, LDLCALC, TRIG, CHOLHDL, LDLDIRECT in the last 72 hours. Thyroid Function Tests: No results for input(s): TSH, T4TOTAL, FREET4, T3FREE, THYROIDAB in the last 72  hours. Anemia Panel: No results for input(s): VITAMINB12, FOLATE, FERRITIN, TIBC, IRON, RETICCTPCT in the last 72 hours. Urine analysis:    Component Value Date/Time   COLORURINE AMBER (A) 05/05/2017 0044   APPEARANCEUR HAZY (A) 05/05/2017 0044   LABSPEC 1.020 05/05/2017 0044   PHURINE 5.0 05/05/2017  [place] 05/05/2017 0044   HGBUR NEGATIVE 05/05/2017 [place] 05/05/2017 0044   [place] 05/05/2017 0044   PROTEINUR 30 (A) 05/05/2017 0044   NITRITE NEGATIVE 05/05/2017 0044   LEUKOCYTESUR NEGATIVE 05/05/2017 0044    Radiological Exams on Admission: Dg Chest Port 1 View  Result Date: 05/04/2017 CLINICAL DATA:  Respiratory distress EXAM: PORTABLE CHEST 1 VIEW COMPARISON:  05/02/2017 FINDINGS: Cardiac shadow is enlarged. Pacing device is again seen and stable. Aortic calcifications are again noted. Increasing infiltrative change is noted emanating from the left hilum likely within the left upper lobe. Right lung remains clear. Increasing left-sided pleural effusion is noted. IMPRESSION: Increasing left perihilar infiltrate and pleural effusion. Electronically Signed   By: Inez Catalina M.D.   On: 05/04/2017 22:51    EKG: Independently reviewed.  Assessment/Plan Principal Problem:   HCAP (healthcare-associated pneumonia) Active Problems:   Chronic atrial fibrillation (HCC)   Diabetes mellitus with hemoglobin A1c goal of 7.0%-8.0% (HCC)   Cardiac pacemaker in situ   Chronic diastolic CHF (congestive heart failure) (HCC)   Benign renovascular hypertension   AKI (acute kidney injury) ([place])   Acute respiratory failure with hypoxia ([place])    1. HCAP - causing acute resp failure with hypoxia, early sepsis 1. PNA pathway 2. Cefepime and vanc 3. Cx pending 4. Cont pulse ox 5. Tele monitor 6. Adult wheeze protocol 7. IVF: 1st L at 150 cc/hr in ED, then 125 cc/hr, hold home lasix 1. Lactate NL, so agree with EDP in doing more gentle / slow  hydration given h/o CHF 8. Got single dose of solumedrol 125 with EMS, will hold off on further steroids for the moment. 9. Tylenol PRN fever 10. Resp virus panel 2. DM2 - 1. Sensitive SSI Q4H 3. AKI - 1. Hydrating with IVF as above 2. Strict intake and output 3. Repeat BMP in AM 4. Hold lasix 4. Chronic diastolic CHF - 1. Hold lasix 2. Clinical picture appears more c/w L lung pneumonia with SIRS, fever, WBC, lack of peripheral edema though her BNP is elevated. 3. Watch for signs of fluid overload with IVF resuscitation. 4. Strict intake and output. 5. Tele monitor 5. H/o AF s/p ablation of AV node and PPM - 1. Cont coumadin 6. HTN - holding home BP meds for now  DVT prophylaxis: Coumadin Code Status: DNR - however family does want patient intubated if needed for PNA, see EDP note Family Communication: No family in room Disposition Plan: return to SNF after admit Consults called: None Admission status: Admit to inpatient   [place], [place] Hospitalists Pager [phone]  If 7AM-7PM, please contact day team taking care of patient [email or web address] Password [email or web address]  05/05/2017, 1:25 AM

## 2017-05-05 NOTE — Progress Notes (Signed)
Inpatient Diabetes Program Recommendations  AACE/ADA: New Consensus Statement on Inpatient Glycemic Control (2015)  Target Ranges:  Prepandial:   less than 140 mg/dL      Peak postprandial:   less than 180 mg/dL (1-2 hours)      Critically ill patients:  140 - 180 mg/dL   Lab Results  Component Value Date   GLUCAP 232 (H) 05/05/2017   HGBA1C 5.8 (H) 12/12/2016    Review of Glycemic Control  Diabetes history: DM 2 Outpatient Diabetes medications: Metformin 1000 mg BID Current orders for Inpatient glycemic control: Novolog Sensitive Correction 0-9 units Q4hours  Inpatient Diabetes Program Recommendations:    Glucose trends 190's-200 after Novolog Correction started. Consider increasing Novolog Correction scale to Novolog Moderate Correction 0-15 units tid.  Thanks,  Tama Headings RN, MSN, BC-ADM, [facility] Inpatient Diabetes Coordinator Team Pager [phone] (8a-5p)

## 2017-05-05 NOTE — Progress Notes (Signed)
[place] for warfarin Indication: atrial fibrillation   Assessment: 25 yof on warfarin PTA for afib. Pharmacy consulted to dose warfarin. Documentation from NH states warfarin "to be stopped 4/21." No clear indication for this med to be stopped - spoke with Dr. Thereasa Solo and will continue dosing at this time. INR 1.8 - last dose on 4/21 PTA. Hg 9.6, plt wnl. No bleed documented.  PTA warfarin dose: 4mg  daily  Goal of Therapy:  INR 2-3 Monitor platelets by anticoagulation protocol: Yes   Plan:  Warfarin 6mg  PO x 1 dose Daily INR Monitor CBC, s/sx bleeding  Elicia Lamp, PharmD, BCPS Clinical Pharmacist 05/05/2017 3:11 PM

## 2017-05-05 NOTE — ED Notes (Signed)
Previous Lidocaine patch removed, and new one applied.

## 2017-05-05 NOTE — ED Notes (Signed)
Pt's family to nurses' desk requesting to relay to the MD that the pt wishes to be a DNR/DNI, not partial code.

## 2017-05-05 NOTE — ED Notes (Signed)
Patient CBG was 232.

## 2017-05-05 NOTE — Progress Notes (Signed)
ANTICOAGULATION and ANTIBIOTIC CONSULT NOTE - Initial Consult  Pharmacy Consult for warfarin and vancomycin/cefepime Indication: atrial fibrillation and pneumonia  Allergies  Allergen Reactions  . Ambien [Zolpidem] Other (See Comments)    "Has the opposite effect; makes me more alert/hyper"  . Ativan [Lorazepam] Other (See Comments)    "Makes me confused and crazy"  . Codeine Itching and Nausea And Vomiting    Patient Measurements: Weight: 70.3kg  Vital Signs: Temp: 101.5 F (38.6 C) (04/21 2310) Temp Source: Rectal (04/21 2310) BP: 138/60 (04/22 0100) Pulse Rate: 72 (04/22 0100)  Labs: Recent Labs    05/02/17 0146 05/04/17 2245 05/05/17 0044  HGB 9.9* 9.6*  --   HCT 30.8* 29.5*  --   PLT 174 221  --   APTT  --   --  47*  LABPROT  --  19.0*  --   INR  --  1.61  --   CREATININE 1.24* 1.58*  --     Estimated Creatinine Clearance: 28.9 mL/min (A) (by C-G formula based on SCr of 1.58 mg/dL (H)).   Medical History: Past Medical History:  Diagnosis Date  . Asthma   . Atrial fibrillation ([place])   . CHF (congestive heart failure) ([place])   . COPD (chronic obstructive pulmonary disease) ([place])   . Dementia   . Diabetes mellitus without complication ([place])   . GERD (gastroesophageal reflux disease)   . Hx of right BKA ([place])   . Hyperlipidemia   . Hypertension   . Melanoma ([place])    chest wall  . Pacemaker   . PAD (peripheral artery disease) (HCC)    L SFA, RCIA, LCIA stents  . Sleep apnea    documented in pt history  . Tachycardia-bradycardia syndrome [facility])      Assessment: 77yo female w/ recent admission for SBO c/o SOB, to begin ABX for PNA; also to continue Coumadin for Afib, current INR below goal with last dose of Coumadin taken 4/21 but SNF MAR notes that Coumadin was to stop on 4/21.  SCr 1.58, was 1.24 3d ago and 0.9 30mo ago.  Goal of Therapy:  INR 2-3 Vanc trough 15-20   Plan:  Rec'd vanc 1250mg  and cefepime 2g IV in ED; will continue with vancomycin  750mg  IV Q24H and cefepime 1g IV Q24H and monitor CBC, Cx, SCr, levels prn.  Will monitor INR for Coumadin dose adjustments but will f/u re: Coumadin stop date prior to redosing.  Wynona Neat, PharmD, BCPS  05/05/2017,1:26 AM

## 2017-05-05 NOTE — Progress Notes (Signed)
[place] TEAM 1 - Stepdown/ICU TEAM  Sarah Moss  ZOX:[id number] DOB: 08-Sep-1940 DOA: 05/04/2017 PCP: Wenda Low, MD    Brief Narrative:  77 y.o. female w/ a hx of Dementia, HTN, DM2, AFib on coumadin, PPM placement, and R BKA who was sent in from her SNF w/ SOB.  O2 sats initially 74% per EMS, and patient was obtunded.   In the ED she had a fever to 101.5, WBC 14.5, Creat 1.58 up from baseline of 0.9, and normal Lactate.  CXR noted a L sided infiltrate and effusion.   Significant Events: 4/21 admit   Subjective: Pt is alert and conversant.  She is mildly confused and can not therefore provide a reliable ROS.  She does not appear to be in acute distress.  She denies cp, n/v, sob, or abdom pain.  She tells me "I just don't feel very good."  Assessment & Plan:  Sepsis and Acute hypoxic respiratory failure due to LLL PNA w/ effusion  Cont empiric abx - clinically stabilizing   DM2 CBG climbing - adjust tx and follow trend   COPD Well compensated at this time   Dementia  No current agitation   AKI  Hydrate and follow trend - crt 1.58 at presentation   Recent Labs  Lab 05/02/17 0146 05/04/17 2245 05/05/17 0629  CREATININE 1.24* 1.58* 1.32*    Chronic diastolic CHF euvolemic at on exam at this time  Atrial Fib s/p ablation of AV node and PPM  Cont coumadin  HTN  BP controlled - holding usual BB and ACE in setting of sepsis   DVT prophylaxis: warfarin  Code Status: DNR - NO CODE (as confirmed by family call to ED as well as DNR paperwork from SNF) Family Communication: no family present at time of exam  Disposition Plan: tele bed   Consultants:  None   Antimicrobials:  Cefepime 4/21 > Vanc 4/21 >   Objective: Blood pressure (!) 117/59, pulse 74, temperature (!) 101.5 F (38.6 C), temperature source Rectal, resp. rate 16, SpO2 94 %.  Intake/Output Summary (Last 24 hours) at 05/05/2017 1605 Last data filed at 05/05/2017 1008 Gross per 24 hour    Intake 350 ml  Output 200 ml  Net 150 ml   There were no vitals filed for this visit.  Examination: General: No acute respiratory distress Lungs: poor air movement in L base - no wheezing - CTA other fields  Cardiovascular: Regular rate and rhythm without murmur gallop or rub normal S1 and S2 Abdomen: Nontender, nondistended, soft, bowel sounds positive, no rebound, no ascites, no appreciable mass Extremities: No significant cyanosis, clubbing, or edema bilateral lower extremities  CBC: Recent Labs  Lab 05/02/17 0146 05/04/17 2245  WBC 12.0* 14.5*  NEUTROABS  --  12.2*  HGB 9.9* 9.6*  HCT 30.8* 29.5*  MCV 83.5 83.3  PLT 174 811   Basic Metabolic Panel: Recent Labs  Lab 05/02/17 0146 05/04/17 2245 05/05/17 0629  NA 133* 137 134*  K 3.4* 4.1 3.8  CL 100* 103 103  CO2 19* 18* 20*  GLUCOSE 136* 161* 198*  BUN 18 37* 39*  CREATININE 1.24* 1.58* 1.32*  CALCIUM 8.2* 8.3* 8.0*   GFR: Estimated Creatinine Clearance: 34.5 mL/min (A) (by C-G formula based on SCr of 1.32 mg/dL (H)).  Liver Function Tests: Recent Labs  Lab 05/04/17 2245  AST 16  ALT 11*  ALKPHOS 68  BILITOT 1.4*  PROT 6.9  ALBUMIN 2.7*    Coagulation Profile: Recent  Labs  Lab 05/04/17 2245 05/05/17 0629  INR 1.61 1.80    HbA1C: Hgb A1c MFr Bld  Date/Time Value Ref Range Status  12/12/2016 06:35 AM 5.8 (H) 4.8 - 5.6 % Final    Comment:    (NOTE) Pre diabetes:          5.7%-6.4% Diabetes:              >6.4% Glycemic control for   <7.0% adults with diabetes     CBG: Recent Labs  Lab 05/05/17 0233 05/05/17 0854 05/05/17 1157  GLUCAP 217* 196* 232*    Recent Results (from the past 240 hour(s))  Respiratory Panel by PCR     Status: None   Collection Time: 05/05/17  2:56 AM  Result Value Ref Range Status   Adenovirus NOT DETECTED NOT DETECTED Final   Coronavirus 229E NOT DETECTED NOT DETECTED Final   Coronavirus HKU1 NOT DETECTED NOT DETECTED Final   Coronavirus NL63 NOT  DETECTED NOT DETECTED Final   Coronavirus OC43 NOT DETECTED NOT DETECTED Final   Metapneumovirus NOT DETECTED NOT DETECTED Final   Rhinovirus / Enterovirus NOT DETECTED NOT DETECTED Final   Influenza A NOT DETECTED NOT DETECTED Final   Influenza B NOT DETECTED NOT DETECTED Final   Parainfluenza Virus 1 NOT DETECTED NOT DETECTED Final   Parainfluenza Virus 2 NOT DETECTED NOT DETECTED Final   Parainfluenza Virus 3 NOT DETECTED NOT DETECTED Final   Parainfluenza Virus 4 NOT DETECTED NOT DETECTED Final   Respiratory Syncytial Virus NOT DETECTED NOT DETECTED Final   Bordetella pertussis NOT DETECTED NOT DETECTED Final   Chlamydophila pneumoniae NOT DETECTED NOT DETECTED Final   Mycoplasma pneumoniae NOT DETECTED NOT DETECTED Final    Comment: Performed at [facility], [place] [address], [place], [place] [address]     Scheduled Meds: . aspirin EC  81 mg Oral Daily  . atorvastatin  40 mg Oral QPM  . DULoxetine  60 mg Oral Daily  . gabapentin  300 mg Oral QHS  . insulin aspart  0-9 Units Subcutaneous Q4H  . iron polysaccharides  150 mg Oral Daily  . levothyroxine  25 mcg Oral QAC breakfast  . lidocaine  1 patch Transdermal Q12H  . Melatonin  4.5 mg Oral QHS  . mometasone-formoterol  2 puff Inhalation BID  . montelukast  10 mg Oral QHS  . pantoprazole  80 mg Oral Daily  . polyethylene glycol  17 g Oral BID  . senna-docusate  1 tablet Oral BID  . traZODone  50 mg Oral QHS  . warfarin  6 mg Oral ONCE-1800  . Warfarin - Pharmacist Dosing Inpatient   Does not apply q1800     LOS: 0 days   Cherene Altes, MD [facility] Office  [phone] Pager - Text Page per Amion as per below:  On-Call/Text Page:      [email or web address]      password [email or web address]  If 7PM-7AM, please contact night-coverage [email or web address] Password [facility] 05/05/2017, 4:05 PM

## 2017-05-06 DIAGNOSIS — J189 Pneumonia, unspecified organism: Secondary | ICD-10-CM

## 2017-05-06 LAB — URINE CULTURE: Culture: NO GROWTH

## 2017-05-06 LAB — COMPREHENSIVE METABOLIC PANEL
ALBUMIN: 2.2 g/dL — AB (ref 3.5–5.0)
ALT: 17 U/L (ref 14–54)
AST: 32 U/L (ref 15–41)
Alkaline Phosphatase: 63 U/L (ref 38–126)
Anion gap: 10 (ref 5–15)
BUN: 40 mg/dL — AB (ref 6–20)
CO2: 18 mmol/L — ABNORMAL LOW (ref 22–32)
Calcium: 8.5 mg/dL — ABNORMAL LOW (ref 8.9–10.3)
Chloride: 107 mmol/L (ref 101–111)
Creatinine, Ser: 1.07 mg/dL — ABNORMAL HIGH (ref 0.44–1.00)
GFR calc Af Amer: 56 mL/min — ABNORMAL LOW (ref >60)
GFR, EST NON AFRICAN AMERICAN: 48 mL/min — AB (ref >60)
GLUCOSE: 219 mg/dL — AB (ref 65–99)
POTASSIUM: 3.9 mmol/L (ref 3.5–5.1)
SODIUM: 135 mmol/L (ref 135–145)
Total Bilirubin: 0.8 mg/dL (ref 0.3–1.2)
Total Protein: 5.9 g/dL — ABNORMAL LOW (ref 6.5–8.1)

## 2017-05-06 LAB — CBC
HEMATOCRIT: 27.8 % — AB (ref 36.0–46.0)
Hemoglobin: 8.9 g/dL — ABNORMAL LOW (ref 12.0–15.0)
MCH: 26.6 pg (ref 26.0–34.0)
MCHC: 32 g/dL (ref 30.0–36.0)
MCV: 83.2 fL (ref 78.0–100.0)
Platelets: 238 10*3/uL (ref 150–400)
RBC: 3.34 MIL/uL — ABNORMAL LOW (ref 3.87–5.11)
RDW: 16.4 % — AB (ref 11.5–15.5)
WBC: 10.7 10*3/uL — AB (ref 4.0–10.5)

## 2017-05-06 LAB — PROTIME-INR
INR: 2.35
PROTHROMBIN TIME: 25.5 s — AB (ref 11.4–15.2)

## 2017-05-06 LAB — GLUCOSE, CAPILLARY
Glucose-Capillary: 219 mg/dL — ABNORMAL HIGH (ref 65–99)
Glucose-Capillary: 277 mg/dL — ABNORMAL HIGH (ref 65–99)
Glucose-Capillary: 215 mg/dL — ABNORMAL HIGH (ref 65–99)
Glucose-Capillary: 139 mg/dL — ABNORMAL HIGH (ref 65–99)

## 2017-05-06 MED ORDER — LISINOPRIL 10 MG PO TABS
20.0000 mg | ORAL_TABLET | Freq: Every day | ORAL | Status: DC
  Administered 2017-05-06 – 2017-05-07 (×2): 20 mg via ORAL
  Filled 2017-05-06 (×2): qty 2

## 2017-05-06 MED ORDER — LEVALBUTEROL HCL 1.25 MG/0.5ML IN NEBU: 1.2500 mg | INHALATION_SOLUTION | Freq: Four times a day (QID) | RESPIRATORY_TRACT | Status: DC | PRN

## 2017-05-06 MED ORDER — FUROSEMIDE 10 MG/ML IJ SOLN
80.0000 mg | Freq: Three times a day (TID) | INTRAMUSCULAR | Status: AC
  Administered 2017-05-06 – 2017-05-07 (×3): 80 mg via INTRAVENOUS
  Filled 2017-05-06 (×3): qty 8

## 2017-05-06 MED ORDER — LEVALBUTEROL HCL 1.25 MG/0.5ML IN NEBU
1.2500 mg | INHALATION_SOLUTION | Freq: Three times a day (TID) | RESPIRATORY_TRACT | Status: DC
  Administered 2017-05-06: 1.25 mg via RESPIRATORY_TRACT
  Filled 2017-05-06: qty 0.5

## 2017-05-06 MED ORDER — WARFARIN SODIUM 4 MG PO TABS
4.0000 mg | ORAL_TABLET | Freq: Every day | ORAL | Status: DC
  Administered 2017-05-06: 4 mg via ORAL
  Filled 2017-05-06: qty 1

## 2017-05-06 MED ORDER — PIPERACILLIN-TAZOBACTAM 3.375 G IVPB
3.3750 g | Freq: Three times a day (TID) | INTRAVENOUS | Status: DC
  Administered 2017-05-06 – 2017-05-07 (×3): 3.375 g via INTRAVENOUS
  Filled 2017-05-06 (×4): qty 50

## 2017-05-06 MED ORDER — PIPERACILLIN-TAZOBACTAM 3.375 G IVPB: 3.3750 g | Freq: Three times a day (TID) | INTRAVENOUS | Status: DC

## 2017-05-06 MED ORDER — CARVEDILOL 12.5 MG PO TABS
12.5000 mg | ORAL_TABLET | Freq: Two times a day (BID) | ORAL | Status: DC
  Administered 2017-05-06 – 2017-05-07 (×2): 12.5 mg via ORAL
  Filled 2017-05-06 (×2): qty 1

## 2017-05-06 NOTE — Progress Notes (Signed)
[place] for warfarin Indication: atrial fibrillation   Assessment: 29 yof on warfarin PTA for afib. Pharmacy consulted to dose warfarin. Documentation from NH states warfarin "to be stopped 4/21." No clear indication for this med to be stopped - spoke with Dr. Thereasa Solo and will continue dosing at this time. INR 1.8 - last dose on 4/21 PTA. Hg 9.6, plt wnl. No bleed documented.  INR today = 2.35  PTA warfarin dose: 4mg  daily  Goal of Therapy:  INR 2-3 Monitor platelets by anticoagulation protocol: Yes   Plan:  Warfarin 4 mg po daily Daily INR Monitor CBC, s/sx bleeding  Thank you Anette Guarneri, PharmD [phone] 05/06/2017 9:09 AM

## 2017-05-06 NOTE — Progress Notes (Signed)
Visited with patient as requested to share Advanced Directive info.  She would like time to look over it and do it later-not today.  I did share before 3pm  is good time.  Asked if she would like prayer and she said yes for her healing and for her family.  Conard Novak, Chaplain   05/06/17 1000  Clinical Encounter Type  Visited With Patient  Visit Type Initial;Spiritual support;Other (Comment) (Advanced Directive Information given)  Referral From Physician  Consult/Referral To Chaplain  Spiritual Encounters  Spiritual Needs Prayer  Stress Factors  Patient Stress Factors Other (Comment) (requested prayer for her healing)  Family Stress Factors Other (Comment) (requested prayer for her family)

## 2017-05-06 NOTE — Progress Notes (Addendum)
Inpatient Diabetes Program Recommendations  AACE/ADA: New Consensus Statement on Inpatient Glycemic Control (2015)  Target Ranges:  Prepandial:   less than 140 mg/dL      Peak postprandial:   less than 180 mg/dL (1-2 hours)      Critically ill patients:  140 - 180 mg/dL   Results for Sarah Moss, Sarah Moss (MRN [id number]) as of 05/06/2017 07:54  Ref. Range 05/05/2017 08:54 05/05/2017 11:57 05/05/2017 17:30 05/05/2017 21:15  Glucose-Capillary Latest Ref Range: 65 - 99 mg/dL 196 (H) 232 (H) 177 (H) 200 (H)   Results for PROMISS, LABARBERA (MRN [id number]) as of 05/06/2017 07:54  Ref. Range 05/06/2017 06:06  Glucose-Capillary Latest Ref Range: 65 - 99 mg/dL 219 (H)    Admit with: SOB/ HCAP  History: Dementia, HTN, DM2, A.Fib, R BKA  Home DM Meds: Metformin 1000 mg BID  Current Insulin Orders: Novolog Moderate Correction Scale/ SSI (0-15 units) TID AC + HS      MD- Note fasting glucose elevated yesterday and today.    Please consider adding basal insulin to inpatient insulin regimen while home DM meds are on hold:    Lantus 10 units daily (0.15 units/kg dosing based on weight of 66 kg)    --Will follow patient during hospitalization--  Wyn Quaker RN, MSN, CDE Diabetes Coordinator Inpatient Glycemic Control Team Team Pager: [phone] (8a-5p)

## 2017-05-06 NOTE — Clinical Social Work Note (Signed)
Clinical Social Work Assessment  Patient Details  Name: STASIA SOMERO MRN: [id number] Date of Birth: 09-23-1940  Date of referral:  05/06/17               Reason for consult:  Discharge Planning, Facility Placement                Permission sought to share information with:  Family Supports Permission granted to share information::  No(Patient has dementia and is alert to person and place only)  Name::     Modena Morrow  Agency::     Relationship::  Daughter  Contact Information:  [phone]  Housing/Transportation Living arrangements for the past 2 months:  Skilled Nursing Facility(Per daughter, patient has been at [facility] for over a year) Source of Information:  Adult Children(Daughter Acupuncturist) Patient Interpreter Needed:  None Criminal Activity/Legal Involvement Pertinent to Current Situation/Hospitalization:  No - Comment as needed Significant Relationships:  Adult Children, Siblings Lives with:  Facility Resident([facility]) Do you feel safe going back to the place where you live?  Yes Need for family participation in patient care:  Yes (Comment)  Care giving concerns:  Daughter expressed no concerns regarding patient's care at skilled nursing facility.  Social Worker assessment / plan: CSW talked with patient's daughter by phone regarding discharge disposition. Ms. Madelynn Done reported that her mother is from [facility] and has been there over a year and the plan is for her to return. Daughter commented that her mother has dementia pretty bad and she is hopeful that she will d/c soon back to facility before she becomes agitated being in a different environment. Ms. Madelynn Done reported that she has 2 sister and 1 brother but they are not involved with their mom, and her aunt (mother's sister) Roe Coombs is involved. Daughter reported that she lives in [place] and is currently in [place] visiting family. CSW informed daughter that she will be  informed when her mom is ready for discharge.  Employment status:  Retired Nurse, adult, Medicaid In [place] PT Recommendations:  Not assessed at this time [place] / Referral to community resources:  Other (Comment Required)(None needed or requested as patient from a skilled nursing facility)  Patient/Family's Response to care:  No concerns expressed by daughter regarding her mother's care during hospitalization.  Patient/Family's Understanding of and Emotional Response to Diagnosis, Current Treatment, and Prognosis: Daughter did not discuss current medical issues, but expressed concern regarding her mother's mental/emotional disposition at the hospital because of the dementia.  Emotional Assessment Appearance:  Other (Comment Required(Did not visit with patient, talked with daughter by phone) Attitude/Demeanor/Rapport:  Unable to Assess Affect (typically observed):  Unable to Assess Orientation:  Oriented to Self, Oriented to Place Alcohol / Substance use:  Tobacco Use, Alcohol Use, Illicit Drugs(Per H&P, patient quit smoking, does not drink or use illicit drugs ) Psych involvement (Current and /or in the community):  No (Comment)  Discharge Needs  Concerns to be addressed:  Discharge Planning Concerns Readmission within the last 30 days:  No Current discharge risk:  None Barriers to Discharge:  Continued Medical Work up   [employer] Mila Homer, [place] 05/06/2017, 5:54 PM

## 2017-05-06 NOTE — Progress Notes (Signed)
PROGRESS NOTE    Sarah Moss  BWG:[id number] DOB: 1940/03/16 DOA: 05/04/2017 PCP: Wenda Low, MD   Brief Narrative:  77 year old female with history of dementia, essential hypertension, diabetes mellitus type 2, atrial fibrillation with pacemaker in place, right BKA, chronically on Coumadin was brought to the hospital from skilled nursing facility for evaluation of shortness of breath and hypoxia.  She was noted to be desaturating and 74% on room air.  Initially she was lethargic and obtunded.  She was noted to be febrile with 101.5, elevated WBC and signs of dehydration.  Chest x-ray showed left sided infiltrate with effusion.  Patient was started on vancomycin and cefepime admit admitted to stepdown unit for closer monitoring and then eventually transition out to telemetry for further care.   Assessment & Plan:   Principal Problem:   HCAP (healthcare-associated pneumonia) Active Problems:   Chronic atrial fibrillation (HCC)   Diabetes mellitus with hemoglobin A1c goal of 7.0%-8.0% (HCC)   Cardiac pacemaker in situ   Chronic diastolic CHF (congestive heart failure) (HCC)   Benign renovascular hypertension   AKI (acute kidney injury) ([place])   Acute respiratory failure with hypoxia (HCC)  Acute hypoxic respiratory distress, 74% on room air, improving Healthcare associated pneumonia -Currently patient is on vancomycin and cefepime, stop vancomycin and cefepime, will continue Zosyn and then eventually transition to Augmentin at the time of discharge -Cultures have been done-no growth thus far -Supplemental oxygen, nebulizer treatments - Incentive spirometry and flutter valve  Acute on chronic combined diastolic and systolic congestive heart failure, ejection fraction 20% Left-sided pleural effusion with some congestion -Fluid is secondary to her pneumonia and/or heart failure.  We will stop IV fluids -Start patient on Lasix IV 40 mg every 8 hours for 3 doses -Supplemental  oxygen as necessary -Resume other home medications - Will restart Coreg 12.5 mg twice daily and lisinopril 20 mg daily  Atrial fibrillation status post ablation and pacemaker in place - Currently seems to be stable.  Continue Coumadin  Acute kidney injury, resolved - Baseline creatinine is around 1.1.  Avoid nephrotoxic drugs.  Today creatinine is 1.07  Diabetes mellitus type 2 -Sliding scale  Essential hypertension - Coreg 12.5 mg twice daily, lisinopril 20 mg daily  History of dementia  DVT prophylaxis: Coumadin Code Status: Coreg Family Communication: None at bedside  Disposition Plan: if remains off oxygen likely discharge in next 24 hours   Consultants:   None  Procedures:   None  Antimicrobials:   Vanc 4/21> 4/23  Cefepime 4/21 >4/23   Zosyn 4/23   Subjective: No complaints. Patient unable to provide me any details in her history due to her dementia. Poor historian.  Review of Systems Otherwise negative except as per HPI, including: General: Denies fever, chills, night sweats or unintended weight loss. Resp: Denies cough, wheezing, shortness of breath. Cardiac: Denies chest pain, palpitations, orthopnea, paroxysmal nocturnal dyspnea. GI: Denies abdominal pain, nausea, vomiting, diarrhea or constipation GU: Denies dysuria, frequency, hesitancy or incontinence MS: Denies muscle aches, joint pain or swelling Neuro: Denies headache, neurologic deficits (focal weakness, numbness, tingling), abnormal gait Psych: Denies anxiety, depression, SI/HI/AVH Skin: Denies new rashes or lesions ID: Denies sick contacts, exotic exposures, travel  Objective: Vitals:   05/06/17 0400 05/06/17 0743 05/06/17 0814 05/06/17 1201  BP: (!) 159/63  (!) 156/75 (!) 154/82  Pulse: 70   71  Resp: 20   (!) 22  Temp: (!) 97.5 F (36.4 C)  (!) 97.5 F (36.4 C) (!) 97.4  F (36.3 C)  TempSrc: Oral  Oral Oral  SpO2: 95% 98%  96%  Weight:      Height:        Intake/Output  Summary (Last 24 hours) at 05/06/2017 1320 Last data filed at 05/06/2017 0700 Gross per 24 hour  Intake 1260 ml  Output -  Net 1260 ml   Filed Weights   05/05/17 1700  Weight: 66.4 kg (146 lb 6.2 oz)    Examination: Constitutional: NAD, calm, comfortable Eyes: PERRL, lids and conjunctivae normal ENMT: Mucous membranes are moist. Posterior pharynx clear of any exudate or lesions.Normal dentition.  Neck: normal, supple, no masses, no thyromegaly Respiratory: diminished BS at the Bases L>R Cardiovascular: Regular rate and rhythm, no murmurs / rubs / gallops. No extremity edema. 2+ pedal pulses. No carotid bruits.  Abdomen: no tenderness, no masses palpated. No hepatosplenomegaly. Bowel sounds positive.  Musculoskeletal: no clubbing / cyanosis. No joint deformity upper and lower extremities. Good ROM, no contractures. Normal muscle tone.  Skin: no rashes, lesions, ulcers. No induration Neurologic: CN 2-12 grossly intact. Sensation intact, DTR normal. Strength 5/5 in all 4.  Psychiatric: Normal judgment and insight. Alert and oriented x 1-2. Normal mood.      Data Reviewed:   CBC: Recent Labs  Lab 05/02/17 0146 05/04/17 2245 05/06/17 0519  WBC 12.0* 14.5* 10.7*  NEUTROABS  --  12.2*  --   HGB 9.9* 9.6* 8.9*  HCT 30.8* 29.5* 27.8*  MCV 83.5 83.3 83.2  PLT 174 221 144   Basic Metabolic Panel: Recent Labs  Lab 05/02/17 0146 05/04/17 2245 05/05/17 0629 05/06/17 0519  NA 133* 137 134* 135  K 3.4* 4.1 3.8 3.9  CL 100* 103 103 107  CO2 19* 18* 20* 18*  GLUCOSE 136* 161* 198* 219*  BUN 18 37* 39* 40*  CREATININE 1.24* 1.58* 1.32* 1.07*  CALCIUM 8.2* 8.3* 8.0* 8.5*   GFR: Estimated Creatinine Clearance: 39 mL/min (A) (by C-G formula based on SCr of 1.07 mg/dL (H)). Liver Function Tests: Recent Labs  Lab 05/04/17 2245 05/06/17 0519  AST 16 32  ALT 11* 17  ALKPHOS 68 63  BILITOT 1.4* 0.8  PROT 6.9 5.9*  ALBUMIN 2.7* 2.2*   No results for input(s): LIPASE,  AMYLASE in the last 168 hours. No results for input(s): AMMONIA in the last 168 hours. Coagulation Profile: Recent Labs  Lab 05/04/17 2245 05/05/17 0629 05/06/17 0519  INR 1.61 1.80 2.35   Cardiac Enzymes: No results for input(s): CKTOTAL, CKMB, CKMBINDEX, TROPONINI in the last 168 hours. BNP (last 3 results) No results for input(s): PROBNP in the last 8760 hours. HbA1C: No results for input(s): HGBA1C in the last 72 hours. CBG: Recent Labs  Lab 05/05/17 1157 05/05/17 1730 05/05/17 2115 05/06/17 0606 05/06/17 1053  GLUCAP 232* 177* 200* 219* 277*   Lipid Profile: No results for input(s): CHOL, HDL, LDLCALC, TRIG, CHOLHDL, LDLDIRECT in the last 72 hours. Thyroid Function Tests: No results for input(s): TSH, T4TOTAL, FREET4, T3FREE, THYROIDAB in the last 72 hours. Anemia Panel: No results for input(s): VITAMINB12, FOLATE, FERRITIN, TIBC, IRON, RETICCTPCT in the last 72 hours. Sepsis Labs: Recent Labs  Lab 05/04/17 2300 05/05/17 0103  LATICACIDVEN 1.60 1.37    Recent Results (from the past 240 hour(s))  Blood Culture (routine x 2)     Status: None (Preliminary result)   Collection Time: 05/04/17 10:45 PM  Result Value Ref Range Status   Specimen Description BLOOD RIGHT HAND  Final   Special  Requests   Final    BOTTLES DRAWN AEROBIC AND ANAEROBIC Blood Culture adequate volume   Culture   Final    NO GROWTH 1 DAY Performed at [facility], [place] [address], [place], [place] [address]    Report Status PENDING  Incomplete  Blood Culture (routine x 2)     Status: None (Preliminary result)   Collection Time: 05/05/17 12:44 AM  Result Value Ref Range Status   Specimen Description BLOOD RIGHT HAND  Final   Special Requests   Final    BOTTLES DRAWN AEROBIC AND ANAEROBIC Blood Culture adequate volume   Culture   Final    NO GROWTH 1 DAY Performed at [facility], [place] [address] [place], [place] [address]    Report Status PENDING  Incomplete  Urine culture      Status: None   Collection Time: 05/05/17 12:44 AM  Result Value Ref Range Status   Specimen Description URINE, CATHETERIZED  Final   Special Requests NONE  Final   Culture   Final    NO GROWTH Performed at [facility], [place] [address], [place], [place] [address]    Report Status 05/06/2017 FINAL  Final  Respiratory Panel by PCR     Status: None   Collection Time: 05/05/17  2:56 AM  Result Value Ref Range Status   Adenovirus NOT DETECTED NOT DETECTED Final   Coronavirus 229E NOT DETECTED NOT DETECTED Final   Coronavirus HKU1 NOT DETECTED NOT DETECTED Final   Coronavirus NL63 NOT DETECTED NOT DETECTED Final   Coronavirus OC43 NOT DETECTED NOT DETECTED Final   Metapneumovirus NOT DETECTED NOT DETECTED Final   Rhinovirus / Enterovirus NOT DETECTED NOT DETECTED Final   Influenza A NOT DETECTED NOT DETECTED Final   Influenza B NOT DETECTED NOT DETECTED Final   Parainfluenza Virus 1 NOT DETECTED NOT DETECTED Final   Parainfluenza Virus 2 NOT DETECTED NOT DETECTED Final   Parainfluenza Virus 3 NOT DETECTED NOT DETECTED Final   Parainfluenza Virus 4 NOT DETECTED NOT DETECTED Final   Respiratory Syncytial Virus NOT DETECTED NOT DETECTED Final   Bordetella pertussis NOT DETECTED NOT DETECTED Final   Chlamydophila pneumoniae NOT DETECTED NOT DETECTED Final   Mycoplasma pneumoniae NOT DETECTED NOT DETECTED Final    Comment: Performed at [facility], [place] [address], [place], [place] [address]         Radiology Studies: Dg Chest Port 1 View  Result Date: 05/04/2017 CLINICAL DATA:  Respiratory distress EXAM: PORTABLE CHEST 1 VIEW COMPARISON:  05/02/2017 FINDINGS: Cardiac shadow is enlarged. Pacing device is again seen and stable. Aortic calcifications are again noted. Increasing infiltrative change is noted emanating from the left hilum likely within the left upper lobe. Right lung remains clear. Increasing left-sided pleural effusion is noted. IMPRESSION: Increasing left  perihilar infiltrate and pleural effusion. Electronically Signed   By: Inez Catalina M.D.   On: 05/04/2017 22:51        Scheduled Meds: . aspirin EC  81 mg Oral Daily  . atorvastatin  40 mg Oral QPM  . DULoxetine  60 mg Oral Daily  . gabapentin  300 mg Oral QHS  . insulin aspart  0-15 Units Subcutaneous TID WC  . insulin aspart  0-5 Units Subcutaneous QHS  . iron polysaccharides  150 mg Oral Daily  . levothyroxine  25 mcg Oral QAC breakfast  . lidocaine  1 patch Transdermal Q12H  . Melatonin  4.5 mg Oral  QHS  . mometasone-formoterol  2 puff Inhalation BID  . montelukast  10 mg Oral QHS  . pantoprazole  80 mg Oral Daily  . polyethylene glycol  17 g Oral BID  . senna-docusate  1 tablet Oral BID  . traZODone  50 mg Oral QHS  . warfarin  4 mg Oral q1800  . Warfarin - Pharmacist Dosing Inpatient   Does not apply q1800   Continuous Infusions: . sodium chloride 60 mL/hr at 05/06/17 0942  . ceFEPime (MAXIPIME) IV Stopped (05/05/17 2221)  . vancomycin Stopped (05/06/17 0041)     LOS: 1 day    Time spent: 32 mins    Ankit Arsenio Loader, MD [facility] Pager [phone]   If 7PM-7AM, please contact night-coverage [email or web address] Password [email or web address] 05/06/2017, 1:20 PM

## 2017-05-06 NOTE — Care Management Note (Signed)
Case Management Note Marvetta Gibbons RN, BSN Unit 4E-Case Manager [phone]  Patient Details  Name: Sarah Moss MRN: [id number] Date of Birth: 1940/07/26  Subjective/Objective:   Pt admitted with HCAP                 Action/Plan: PTA pt from [place] to follow for return to SNF when medically stable  Expected Discharge Date:  05/08/17               Expected Discharge Plan:  Skilled Nursing Facility  In-House Referral:  Clinical Social Work  Discharge planning Services  CM Consult  Post Acute Care Choice:    Choice offered to:     DME Arranged:    DME Agency:     HH Arranged:    [place] Agency:     Status of Service:  In process, will continue to follow  If discussed at Long Length of Stay Meetings, dates discussed:    Discharge Disposition: skilled facility   Additional Comments:  Dawayne Patricia, RN 05/06/2017, 10:12 AM

## 2017-05-06 NOTE — NC FL2 (Signed)
[place] LEVEL OF CARE SCREENING TOOL     IDENTIFICATION  Patient Name: Sarah Moss Birthdate: 1940-09-11 Sex: female Admission Date (Current Location): 05/04/2017  [facility] and [place] Number:      Facility and Address:  [facility], [place] [address] [place], [place] [address]      Provider Number: [id number]  Attending Physician Name and Address:  Damita Lack, MD  Relative Name and Phone Number:       Current Level of Care: Hospital Recommended Level of Care: [place] Prior Approval Number:    Date Approved/Denied:   PASRR Number:    Discharge Plan: SNF    Current Diagnoses: Patient Active Problem List   Diagnosis Date Noted  . HCAP (healthcare-associated pneumonia) 05/05/2017  . AKI (acute kidney injury) ([place]) 05/05/2017  . Acute respiratory failure with hypoxia ([place]) 05/05/2017  . Critical lower limb ischemia 02/06/2017  . Ileus ([place]) 07/24/2016  . Permanent atrial fibrillation ([place]) 05/10/2016  . Status post below knee amputation of right lower extremity ([place]) 05/10/2016  . Longstanding persistent atrial fibrillation ([place]) 04/02/2016  . CHB (complete heart block) (HCC) s/p AV node ablation 04/02/2016  . Chronic anticoagulation 03/22/2016  . Microcytic anemia 03/22/2016  . Diabetes mellitus with hemoglobin A1c goal of 7.0%-8.0% ([place])   . Cardiomyopathy ([place]) 03/21/2016  . PAD (peripheral artery disease) ([place])   . Chronic atrial fibrillation ([place]) 03/18/2016  . Amputee, below knee ([place]) 03/18/2016  . Congestive heart failure ([place]) 03/18/2016  . Pulmonary emphysema ([place]) 03/18/2016  . GERD (gastroesophageal reflux disease) 03/18/2016  . Acute systolic heart failure ([place]) 03/18/2016  . Bilateral pleural effusion 02/24/2015  . S/P AV nodal ablation 02/21/2015  . Cardiac pacemaker in situ 02/20/2015  . Urticaria 02/20/2015  . Type 2 diabetes mellitus with complication ([place]) 92/11/9415  .  Tachy-brady syndrome ([place]) 12/19/2014  . Normal left ventricular systolic function and wall motion 10/12/2014  . Benign essential hypertension 09/14/2014  . Dyspnea 09/14/2014  . Long-term use of aspirin therapy 09/14/2014  . Allergic rhinitis 08/03/2014  . DOE (dyspnea on exertion) 08/03/2014  . Pulmonary HTN ([place]) 08/03/2014  . Sleep apnea 08/03/2014  . Sleep apnea with use of continuous positive airway pressure (CPAP) 08/03/2014  . Chronic diastolic CHF (congestive heart failure) ([place]) 05/30/2014  . HTN (hypertension) 11/04/2012  . Hypothyroidism 11/04/2012  . Type 2 diabetes mellitus with diabetic polyneuropathy ([place]) 11/04/2012  . Red blood cell antibody positive 10/28/2011  . Arthropathy 08/17/2009  . Mixed incontinence 08/17/2009  . Benign renovascular hypertension 08/17/2009  . Anxiety state 06/05/2009  . Abnormal finding on thyroid function test 06/01/2009  . Vitamin D deficiency 06/01/2009  . Type II or unspecified type diabetes mellitus without mention of complication, not stated as uncontrolled 05/26/2009  . Asthma 05/24/2009  . Chronic kidney disease, stage III (moderate) ([place]) 05/24/2009  . Depressive disorder, not elsewhere classified 05/24/2009  . Insomnia 05/24/2009  . Osteoarthritis 05/24/2009  . Osteoporosis 05/24/2009  . Peripheral vascular disease ([place]) 05/24/2009    Orientation RESPIRATION BLADDER Height & Weight     Self, Place  Normal Incontinent, Indwelling catheter Weight: 146 lb 6.2 oz (66.4 kg) Height:  5\' 5"  (165.1 cm)  BEHAVIORAL SYMPTOMS/MOOD NEUROLOGICAL BOWEL NUTRITION STATUS      Incontinent Diet(cardiac; carb modified)  AMBULATORY STATUS COMMUNICATION OF NEEDS Skin   Extensive Assist Verbally Normal  Personal Care Assistance Level of Assistance  Bathing, Dressing Bathing Assistance: Maximum assistance   Dressing Assistance: Maximum assistance     Functional Limitations Info             SPECIAL CARE  FACTORS FREQUENCY  PT (By licensed PT), OT (By licensed OT)     PT Frequency: 5/wk OT Frequency: 5/wk            Contractures      Additional Factors Info  Code Status, Insulin Sliding Scale Code Status Info: DNR     Insulin Sliding Scale Info: Ambien Zolpidem, Ativan Lorazepam, Codeine       Current Medications (05/06/2017):  This is the current hospital active medication list Current Facility-Administered Medications  Medication Dose Route Frequency Provider Last Rate Last Dose  . 0.9 %  sodium chloride infusion   Intravenous Continuous Cherene Altes, MD 60 mL/hr at 05/06/17 [phone]    . acetaminophen (TYLENOL) tablet 650 mg  650 mg Oral Q6H PRN Cherene Altes, MD      . albuterol (PROVENTIL) (2.5 MG/3ML) 0.083% nebulizer solution 2.5 mg  2.5 mg Nebulization Q2H PRN Cherene Altes, MD      . ALPRAZolam Duanne Moron) tablet 0.25 mg  0.25 mg Oral TID PRN Etta Quill, DO      . aspirin EC tablet 81 mg  81 mg Oral Daily Jennette Kettle M, DO   81 mg at 05/06/17 0934  . atorvastatin (LIPITOR) tablet 40 mg  40 mg Oral QPM Jennette Kettle M, DO   40 mg at 05/05/17 1737  . ceFEPIme (MAXIPIME) 1 g in sodium chloride 0.9 % 100 mL IVPB  1 g Intravenous Q24H Etta Quill, DO   Stopped at 05/05/17 2221  . DULoxetine (CYMBALTA) DR capsule 60 mg  60 mg Oral Daily Etta Quill, DO   60 mg at 05/06/17 0935  . gabapentin (NEURONTIN) capsule 300 mg  300 mg Oral QHS Jennette Kettle M, DO   300 mg at 05/05/17 2141  . guaiFENesin-dextromethorphan (ROBITUSSIN DM) 100-10 MG/5ML syrup 15 mL  15 mL Oral Q4H PRN Etta Quill, DO      . insulin aspart (novoLOG) injection 0-15 Units  0-15 Units Subcutaneous TID WC Cherene Altes, MD   8 Units at 05/06/17 1155  . insulin aspart (novoLOG) injection 0-5 Units  0-5 Units Subcutaneous QHS Joette Catching T, MD      . iron polysaccharides (NIFEREX) capsule 150 mg  150 mg Oral Daily Jennette Kettle M, DO   150 mg at 05/06/17 0934  .  levothyroxine (SYNTHROID, LEVOTHROID) tablet 25 mcg  25 mcg Oral QAC breakfast Etta Quill, DO   25 mcg at 05/06/17 [phone]  . lidocaine (LIDODERM) 5 % 1 patch  1 patch Transdermal Q12H Etta Quill, DO   1 patch at 05/06/17 0934  . Melatonin TABS 4.5 mg  4.5 mg Oral QHS Jennette Kettle M, DO   4.5 mg at 05/05/17 2139  . mometasone-formoterol (DULERA) 200-5 MCG/ACT inhaler 2 puff  2 puff Inhalation BID Etta Quill, DO   2 puff at 05/06/17 [phone]  . montelukast (SINGULAIR) tablet 10 mg  10 mg Oral QHS Jennette Kettle M, DO   10 mg at 05/05/17 2140  . oxyCODONE-acetaminophen (PERCOCET/ROXICET) 5-325 MG per tablet 1 tablet  1 tablet Oral Q4H PRN Etta Quill, DO      . pantoprazole (PROTONIX) EC tablet 80 mg  80 mg Oral Daily Alcario Drought,  Toy Care, DO   80 mg at 05/06/17 0934  . polyethylene glycol (MIRALAX / GLYCOLAX) packet 17 g  17 g Oral BID Jennette Kettle M, DO   17 g at 05/06/17 0933  . senna-docusate (Senokot-S) tablet 1 tablet  1 tablet Oral BID Etta Quill, DO   1 tablet at 05/06/17 0934  . traZODone (DESYREL) tablet 50 mg  50 mg Oral QHS Jennette Kettle M, DO   50 mg at 05/05/17 2140  . vancomycin (VANCOCIN) IVPB 750 mg/150 ml premix  750 mg Intravenous Q24H Laren Everts, RPH   Stopped at 05/06/17 0041  . warfarin (COUMADIN) tablet 4 mg  4 mg Oral q1800 Amin, Jeanella Flattery, MD      . Warfarin - Pharmacist Dosing Inpatient   Does not apply q1800 Laren Everts, [facility]         Discharge Medications: Please see discharge summary for a list of discharge medications.  Relevant Imaging Results:  Relevant Lab Results:   Additional Information SSN: [id number]  Jorge Ny, [place]

## 2017-05-07 ENCOUNTER — Inpatient Hospital Stay (HOSPITAL_COMMUNITY): Payer: Medicare HMO

## 2017-05-07 LAB — BASIC METABOLIC PANEL
Anion gap: 15 (ref 5–15)
BUN: 32 mg/dL — AB (ref 6–20)
CHLORIDE: 101 mmol/L (ref 101–111)
CO2: 23 mmol/L (ref 22–32)
CREATININE: 1.12 mg/dL — AB (ref 0.44–1.00)
Calcium: 8.8 mg/dL — ABNORMAL LOW (ref 8.9–10.3)
GFR calc Af Amer: 53 mL/min — ABNORMAL LOW (ref >60)
GFR, EST NON AFRICAN AMERICAN: 46 mL/min — AB (ref >60)
Glucose, Bld: 166 mg/dL — ABNORMAL HIGH (ref 65–99)
Potassium: 3 mmol/L — ABNORMAL LOW (ref 3.5–5.1)
SODIUM: 139 mmol/L (ref 135–145)

## 2017-05-07 LAB — BRAIN NATRIURETIC PEPTIDE: B NATRIURETIC PEPTIDE 5: 795.1 pg/mL — AB (ref 0.0–100.0)

## 2017-05-07 LAB — GLUCOSE, CAPILLARY
Glucose-Capillary: 173 mg/dL — ABNORMAL HIGH (ref 65–99)
Glucose-Capillary: 179 mg/dL — ABNORMAL HIGH (ref 65–99)

## 2017-05-07 LAB — MAGNESIUM: MAGNESIUM: 1.5 mg/dL — AB (ref 1.7–2.4)

## 2017-05-07 LAB — PROTIME-INR
INR: 2.41
Prothrombin Time: 26.1 seconds — ABNORMAL HIGH (ref 11.4–15.2)

## 2017-05-07 MED ORDER — AMOXICILLIN-POT CLAVULANATE 875-125 MG PO TABS: 1.0000 | ORAL_TABLET | Freq: Two times a day (BID) | ORAL | 0 refills | Status: AC

## 2017-05-07 MED ORDER — POTASSIUM CHLORIDE CRYS ER 20 MEQ PO TBCR
40.0000 meq | EXTENDED_RELEASE_TABLET | Freq: Once | ORAL | Status: AC
  Administered 2017-05-07: 40 meq via ORAL
  Filled 2017-05-07: qty 2

## 2017-05-07 MED ORDER — MAGNESIUM OXIDE 400 (241.3 MG) MG PO TABS
800.0000 mg | ORAL_TABLET | Freq: Once | ORAL | Status: AC
  Administered 2017-05-07: 800 mg via ORAL
  Filled 2017-05-07: qty 2

## 2017-05-07 NOTE — Discharge Summary (Signed)
Physician Discharge Summary  Sarah Moss IHK:[id number] DOB: Jun 11, 1940 DOA: 05/04/2017  PCP: Wenda Low, MD  Admit date: 05/04/2017 Discharge date: 05/07/2017  Admitted From: SNF Disposition:  S[facility] - [facility]  Recommendations for Outpatient Follow-up:  1. Follow up with PCP in 1-2 weeks 2. Please obtain BMP/CBC in one week your next doctors visit. Check INR outpatient within next 2-3 days.  3. Take Oral Augmentin for 7 days   Discharge Condition: Stable CODE STATUS: DNR Diet recommendation:  Cardiac/Diabetic Diet   Brief/Interim Summary: 77 year old female with history of dementia, essential hypertension, diabetes mellitus type 2, atrial fibrillation with pacemaker in place and chronically on Coumadin, right BKA came to the hospital from skilled nursing facility for evaluation of shortness of breath and hypoxia.  Initially she was noted to be hypoxic and 74% on room air.  She was lethargic and somewhat obtunded.  She was febrile with 101.5 F with elevated WBC.  Chest x-ray was suggestive of left-sided infiltrate with effusion.  She was started on vancomycin and cefepime and admitted to the stepdown unit for closer monitoring.  Over the course of 3 days breathing significantly improved, her vancomycin and cefepime was transitioned to Zosyn and then Augmentin at the time of discharge.  Her cultures remain negative and she was weaned off of oxygen.  She also received gentle diuresis for slight concerns of fluid overload and mild acute on chronic combined diastolic and systolic congestive heart failure with ejection fraction 20%.  Her Coumadin levels are managed by our pharmacy here.  On the day of discharge her INR levels were 2.4.  At this point patient has reached maximum benefit from an hospital stay and is stable to be discharged with outpatient follow-up recommendations as stated above.  Patient denies any complaints.  She is pleasantly confused but appears to be very  comfortable.  ROS: HEENT/EYES = negative for pain, redness, loss of vision, double vision, blurred vision, loss of hearing, sore throat, hoarseness, dysphagia Cardiovascular= negative for chest pain, palpitation, murmurs, lower extremity swelling Respiratory/lungs= negative for shortness of breath, cough, hemoptysis, wheezing, mucus production Gastrointestinal= negative for nausea, vomiting,, abdominal pain, melena, hematemesis Genitourinary= negative for Dysuria, Hematuria, Change in Urinary Frequency MSK = Negative for arthralgia, myalgias, Back Pain, Joint swelling  Neurology= Negative for headache, seizures, numbness, tingling  Psychiatry= Negative for anxiety, depression, suicidal and homocidal ideation Allergy/Immunology= Medication/Food allergy as listed  Skin= Negative for Rash, lesions, ulcers, itching    Discharge Diagnoses:  Principal Problem:   HCAP (healthcare-associated pneumonia) Active Problems:   Chronic atrial fibrillation (HCC)   Diabetes mellitus with hemoglobin A1c goal of 7.0%-8.0% (HCC)   Cardiac pacemaker in situ   Chronic diastolic CHF (congestive heart failure) (HCC)   Benign renovascular hypertension   AKI (acute kidney injury) ([place])   Acute respiratory failure with hypoxia (HCC)   Acute hypoxic respiratory distress, 74% on room air,resolved Healthcare associated pneumonia -Stop IV Abx, Discharge on 7 days of Oral Augmentin. -Cultures have been done-no growth thus far -Supplemental oxygen, nebulizer treatments - Incentive spirometry and flutter valve  Acute on chronic combined diastolic and systolic congestive heart failure, ejection fraction 20%; resolved.  Left-sided pleural effusion with some congestion -Overall patient does not appear short of breath.  Still has infiltrate and some effusion in her lungs.  She was diuresed with IV Lasix yesterday and responded well.  Oxygen has been weaned off. -Supplemental oxygen as necessary -Resume other home  medications - Coreg 12.5 mg twice daily and lisinopril  20 mg daily  Atrial fibrillation status post ablation and pacemaker in place - Currently seems to be stable.  Continue Coumadin.  INR today is 2.4  Acute kidney injury, resolved -Creatinine is at baseline 1.1  Hypokalemia and hypomagnesemia -She will get 40.eq of p.o. potassium and 800 magnesium oxide before discharge  Diabetes mellitus type 2 -Sliding scale  Essential hypertension - Coreg 12.5 mg twice daily, lisinopril 20 mg daily  History of dementia  Patient is a DO NOT RESUSCITATE Discharge back to [facility] skilled nursing facility today.  Discharge Instructions   Allergies as of 05/07/2017      Reactions   Ambien [zolpidem] Other (See Comments)   "Has the opposite effect; makes me more alert/hyper"   Ativan [lorazepam] Other (See Comments)   "Makes me confused and crazy"   Codeine Itching, Nausea And Vomiting      Medication List    TAKE these medications   ADVAIR DISKUS 250-50 MCG/DOSE Aepb Generic drug:  Fluticasone-Salmeterol Inhale 1 puff into the lungs 2 (two) times daily.   ALPRAZolam 0.25 MG tablet Commonly known as:  XANAX Take 0.25 mg by mouth 3 (three) times daily as needed for anxiety.   amoxicillin-clavulanate 875-125 MG tablet Commonly known as:  AUGMENTIN Take 1 tablet by mouth every 12 (twelve) hours for 7 days.   aspirin 81 MG EC tablet Take 1 tablet (81 mg total) by mouth daily.   atorvastatin 40 MG tablet Commonly known as:  LIPITOR Take 40 mg by mouth every evening.   carvedilol 12.5 MG tablet Commonly known as:  COREG Take 1 tablet (12.5 mg total) by mouth 2 (two) times daily with a meal.   DULoxetine 60 MG capsule Commonly known as:  CYMBALTA Take 60 mg by mouth daily.   furosemide 40 MG tablet Commonly known as:  LASIX Take 40 mg by mouth daily.   gabapentin 300 MG capsule Commonly known as:  NEURONTIN Take 300 mg by mouth at bedtime.    guaiFENesin-dextromethorphan 100-10 MG/5ML syrup Commonly known as:  ROBITUSSIN DM Take 15 mLs by mouth every 4 (four) hours as needed for cough.   ipratropium-albuterol 0.5-2.5 (3) MG/3ML Soln Commonly known as:  DUONEB Take 3 mLs by nebulization every 6 (six) hours as needed (for cough and wheezing).   iron polysaccharides 150 MG capsule Commonly known as:  NIFEREX Take 1 capsule (150 mg total) by mouth daily.   levothyroxine 25 MCG tablet Commonly known as:  SYNTHROID, LEVOTHROID Take 25 mcg by mouth daily.   lidocaine 5 % Commonly known as:  LIDODERM Place 1 patch onto the skin every 12 (twelve) hours. Remove & Discard patch within 12 hours or as directed by MD   lisinopril 20 MG tablet Commonly known as:  PRINIVIL,ZESTRIL Take 20 mg by mouth daily.   Melatonin 5 MG Tabs Take 5 mg by mouth at bedtime.   metFORMIN 1000 MG tablet Commonly known as:  GLUCOPHAGE Take 1,000 mg by mouth 2 (two) times daily.   montelukast 10 MG tablet Commonly known as:  SINGULAIR Take 10 mg by mouth at bedtime.   nitroGLYCERIN 0.4 MG SL tablet Commonly known as:  NITROSTAT Place 0.4 mg under the tongue every 5 (five) minutes as needed for chest pain.   omeprazole 40 MG capsule Commonly known as:  PRILOSEC Take 40 mg by mouth daily.   oxyCODONE-acetaminophen 5-325 MG tablet Commonly known as:  PERCOCET/ROXICET Take 1 tablet by mouth every 6 (six) hours as needed for severe pain.  What changed:  when to take this   oxymetazoline 0.05 % nasal spray Commonly known as:  AFRIN Place 2 sprays into both nostrils daily.   oxymetazoline 0.05 % nasal spray Commonly known as:  AFRIN Place 1 spray into both nostrils every hour as needed (for dryness or nosebleed).   polyethylene glycol packet Commonly known as:  MIRALAX / GLYCOLAX Take 17 g by mouth 2 (two) times daily.   senna-docusate 8.6-50 MG tablet Commonly known as:  Senokot-S Take 1 tablet by mouth 2 (two) times daily.    traZODone 50 MG tablet Commonly known as:  DESYREL Take 50 mg by mouth at bedtime.   Vitamin D (Ergocalciferol) 50000 units Caps capsule Commonly known as:  DRISDOL Take 50,000 Units by mouth 2 (two) times a week. On Tuesday and Friday   warfarin 4 MG tablet Commonly known as:  COUMADIN Take 4 mg by mouth daily.      Follow-up Information    Wenda Low, MD. Schedule an appointment as soon as possible for a visit in 1 week(s).   Specialty:  Internal Medicine Contact information: [address] [place] [address] [phone]          Allergies  Allergen Reactions  . Ambien [Zolpidem] Other (See Comments)    "Has the opposite effect; makes me more alert/hyper"  . Ativan [Lorazepam] Other (See Comments)    "Makes me confused and crazy"  . Codeine Itching and Nausea And Vomiting    You were cared for by a hospitalist during your hospital stay. If you have any questions about your discharge medications or the care you received while you were in the hospital after you are discharged, you can call the unit and asked to speak with the hospitalist on call if the hospitalist that took care of you is not available. Once you are discharged, your primary care physician will handle any further medical issues. Please note that no refills for any discharge medications will be authorized once you are discharged, as it is imperative that you return to your primary care physician (or establish a relationship with a primary care physician if you do not have one) for your aftercare needs so that they can reassess your need for medications and monitor your lab values.  Consultations:  None   Procedures/Studies: Dg Chest 2 View  Result Date: 05/02/2017 CLINICAL DATA:  Acute onset of central chest pain and nausea. Diaphoresis. EXAM: CHEST - 2 VIEW COMPARISON:  Chest radiograph performed 08/19/2016 FINDINGS: A small left pleural effusion is noted. Increased interstitial  markings, more prominent on the left, may reflect asymmetric interstitial edema. No pneumothorax is seen. The heart is borderline normal in size. A pacemaker is noted at left chest wall, with leads ending at the right atrium and right ventricle. No acute osseous abnormalities are seen. IMPRESSION: Small left pleural effusion noted. Increased interstitial markings, more prominent on the left, may reflect asymmetric interstitial edema. Electronically Signed   By: Garald Balding M.D.   On: 05/02/2017 02:33   Dg Chest Port 1 View  Result Date: 05/07/2017 CLINICAL DATA:  Shortness of Breath EXAM: PORTABLE CHEST 1 VIEW COMPARISON:  05/04/2017 FINDINGS: Cardiac shadow is stable. Pacing device is again seen and stable. Persistent left perihilar infiltrate is noted. Small left pleural effusion remains. The right lung remains clear. IMPRESSION: Stable left-sided infiltrate and effusion. Electronically Signed   By: Inez Catalina M.D.   On: 05/07/2017 10:57   Dg Chest Port 1  View  Result Date: 05/04/2017 CLINICAL DATA:  Respiratory distress EXAM: PORTABLE CHEST 1 VIEW COMPARISON:  05/02/2017 FINDINGS: Cardiac shadow is enlarged. Pacing device is again seen and stable. Aortic calcifications are again noted. Increasing infiltrative change is noted emanating from the left hilum likely within the left upper lobe. Right lung remains clear. Increasing left-sided pleural effusion is noted. IMPRESSION: Increasing left perihilar infiltrate and pleural effusion. Electronically Signed   By: Inez Catalina M.D.   On: 05/04/2017 22:51      Subjective:   Discharge Exam: Vitals:   05/07/17 0408 05/07/17 0548  BP: (!) 125/59   Pulse: 74   Resp: 18   Temp: 98.6 F (37 C)   SpO2: 97% 95%   Vitals:   05/06/17 2253 05/07/17 0206 05/07/17 0408 05/07/17 0548  BP:   (!) 125/59   Pulse:   74   Resp:   18   Temp:   98.6 F (37 C)   TempSrc:   Oral   SpO2: 95%  97% 95%  Weight:  68.7 kg (151 lb 7.3 oz)    Height:         General: Pt is alert, awake, not in acute distress; AAOx1-2 Cardiovascular: RRR, S1/S2 +, no rubs, no gallops Respiratory: Mildly diminished breath sounds at the bases more on the left and right Abdominal: Soft, NT, ND, bowel sounds + Extremities: no edema, no cyanosis Neuro: Follows all commands without any issues, no focal neuro deficits.    The results of significant diagnostics from this hospitalization (including imaging, microbiology, ancillary and laboratory) are listed below for reference.     Microbiology: Recent Results (from the past 240 hour(s))  Blood Culture (routine x 2)     Status: None (Preliminary result)   Collection Time: 05/04/17 10:45 PM  Result Value Ref Range Status   Specimen Description BLOOD RIGHT HAND  Final   Special Requests   Final    BOTTLES DRAWN AEROBIC AND ANAEROBIC Blood Culture adequate volume   Culture   Final    NO GROWTH 1 DAY Performed at [facility], [address], [place], [place] [address]    Report Status PENDING  Incomplete  Blood Culture (routine x 2)     Status: None (Preliminary result)   Collection Time: 05/05/17 12:44 AM  Result Value Ref Range Status   Specimen Description BLOOD RIGHT HAND  Final   Special Requests   Final    BOTTLES DRAWN AEROBIC AND ANAEROBIC Blood Culture adequate volume   Culture   Final    NO GROWTH 1 DAY Performed at [facility], [place] [address], [place], [place] [address]    Report Status PENDING  Incomplete  Urine culture     Status: None   Collection Time: 05/05/17 12:44 AM  Result Value Ref Range Status   Specimen Description URINE, CATHETERIZED  Final   Special Requests NONE  Final   Culture   Final    NO GROWTH Performed at [facility], [place] [address] [place], [place] [address]    Report Status 05/06/2017 FINAL  Final  Respiratory Panel by PCR     Status: None   Collection Time: 05/05/17  2:56 AM  Result Value Ref Range Status   Adenovirus NOT DETECTED NOT  DETECTED Final   Coronavirus 229E NOT DETECTED NOT DETECTED Final   Coronavirus HKU1 NOT DETECTED NOT DETECTED Final   Coronavirus NL63 NOT DETECTED NOT DETECTED Final   Coronavirus OC43  NOT DETECTED NOT DETECTED Final   Metapneumovirus NOT DETECTED NOT DETECTED Final   Rhinovirus / Enterovirus NOT DETECTED NOT DETECTED Final   Influenza A NOT DETECTED NOT DETECTED Final   Influenza B NOT DETECTED NOT DETECTED Final   Parainfluenza Virus 1 NOT DETECTED NOT DETECTED Final   Parainfluenza Virus 2 NOT DETECTED NOT DETECTED Final   Parainfluenza Virus 3 NOT DETECTED NOT DETECTED Final   Parainfluenza Virus 4 NOT DETECTED NOT DETECTED Final   Respiratory Syncytial Virus NOT DETECTED NOT DETECTED Final   Bordetella pertussis NOT DETECTED NOT DETECTED Final   Chlamydophila pneumoniae NOT DETECTED NOT DETECTED Final   Mycoplasma pneumoniae NOT DETECTED NOT DETECTED Final    Comment: Performed at [facility], [place] [address] [place], [place] [address]     Labs: BNP (last 3 results) Recent Labs    05/04/17 2237 05/07/17 0615  BNP 1,393.6* 716.9*   Basic Metabolic Panel: Recent Labs  Lab 05/02/17 0146 05/04/17 2245 05/05/17 0629 05/06/17 0519 05/07/17 0818  NA 133* 137 134* 135 139  K 3.4* 4.1 3.8 3.9 3.0*  CL 100* 103 103 107 101  CO2 19* 18* 20* 18* 23  GLUCOSE 136* 161* 198* 219* 166*  BUN 18 37* 39* 40* 32*  CREATININE 1.24* 1.58* 1.32* 1.07* 1.12*  CALCIUM 8.2* 8.3* 8.0* 8.5* 8.8*  MG  --   --   --   --  1.5*   Liver Function Tests: Recent Labs  Lab 05/04/17 2245 05/06/17 0519  AST 16 32  ALT 11* 17  ALKPHOS 68 63  BILITOT 1.4* 0.8  PROT 6.9 5.9*  ALBUMIN 2.7* 2.2*   No results for input(s): LIPASE, AMYLASE in the last 168 hours. No results for input(s): AMMONIA in the last 168 hours. CBC: Recent Labs  Lab 05/02/17 0146 05/04/17 2245 05/06/17 0519  WBC 12.0* 14.5* 10.7*  NEUTROABS  --  12.2*  --   HGB 9.9* 9.6* 8.9*  HCT 30.8* 29.5* 27.8*  MCV  83.5 83.3 83.2  PLT 174 221 238   Cardiac Enzymes: No results for input(s): CKTOTAL, CKMB, CKMBINDEX, TROPONINI in the last 168 hours. BNP: Invalid input(s): POCBNP CBG: Recent Labs  Lab 05/06/17 0606 05/06/17 1053 05/06/17 1633 05/06/17 2121 05/07/17 0547  GLUCAP 219* 277* 215* 139* 173*   D-Dimer No results for input(s): DDIMER in the last 72 hours. Hgb A1c No results for input(s): HGBA1C in the last 72 hours. Lipid Profile No results for input(s): CHOL, HDL, LDLCALC, TRIG, CHOLHDL, LDLDIRECT in the last 72 hours. Thyroid function studies No results for input(s): TSH, T4TOTAL, T3FREE, THYROIDAB in the last 72 hours.  Invalid input(s): FREET3 Anemia work up No results for input(s): VITAMINB12, FOLATE, FERRITIN, TIBC, IRON, RETICCTPCT in the last 72 hours. Urinalysis    Component Value Date/Time   COLORURINE AMBER (A) 05/05/2017 0044   APPEARANCEUR HAZY (A) 05/05/2017 0044   LABSPEC 1.020 05/05/2017 0044   PHURINE 5.0 05/05/2017 0044   GLUCOSEU NEGATIVE 05/05/2017 0044   HGBUR NEGATIVE 05/05/2017 0044   BILIRUBINUR NEGATIVE 05/05/2017 0044   KETONESUR NEGATIVE 05/05/2017 0044   PROTEINUR 30 (A) 05/05/2017 0044   NITRITE NEGATIVE 05/05/2017 0044   LEUKOCYTESUR NEGATIVE 05/05/2017 0044   Sepsis Labs Invalid input(s): PROCALCITONIN,  WBC,  LACTICIDVEN Microbiology Recent Results (from the past 240 hour(s))  Blood Culture (routine x 2)     Status: None (Preliminary result)   Collection Time: 05/04/17 10:45 PM  Result Value Ref Range Status  Specimen Description BLOOD RIGHT HAND  Final   Special Requests   Final    BOTTLES DRAWN AEROBIC AND ANAEROBIC Blood Culture adequate volume   Culture   Final    NO GROWTH 1 DAY Performed at [facility], [address] [place], [place] [address]    Report Status PENDING  Incomplete  Blood Culture (routine x 2)     Status: None (Preliminary result)   Collection Time: 05/05/17 12:44 AM  Result Value Ref Range Status    Specimen Description BLOOD RIGHT HAND  Final   Special Requests   Final    BOTTLES DRAWN AEROBIC AND ANAEROBIC Blood Culture adequate volume   Culture   Final    NO GROWTH 1 DAY Performed at [facility], [place] [address], [place], [place] [address]    Report Status PENDING  Incomplete  Urine culture     Status: None   Collection Time: 05/05/17 12:44 AM  Result Value Ref Range Status   Specimen Description URINE, CATHETERIZED  Final   Special Requests NONE  Final   Culture   Final    NO GROWTH Performed at [facility], [place] [address], [place], [place] [address]    Report Status 05/06/2017 FINAL  Final  Respiratory Panel by PCR     Status: None   Collection Time: 05/05/17  2:56 AM  Result Value Ref Range Status   Adenovirus NOT DETECTED NOT DETECTED Final   Coronavirus 229E NOT DETECTED NOT DETECTED Final   Coronavirus HKU1 NOT DETECTED NOT DETECTED Final   Coronavirus NL63 NOT DETECTED NOT DETECTED Final   Coronavirus OC43 NOT DETECTED NOT DETECTED Final   Metapneumovirus NOT DETECTED NOT DETECTED Final   Rhinovirus / Enterovirus NOT DETECTED NOT DETECTED Final   Influenza A NOT DETECTED NOT DETECTED Final   Influenza B NOT DETECTED NOT DETECTED Final   Parainfluenza Virus 1 NOT DETECTED NOT DETECTED Final   Parainfluenza Virus 2 NOT DETECTED NOT DETECTED Final   Parainfluenza Virus 3 NOT DETECTED NOT DETECTED Final   Parainfluenza Virus 4 NOT DETECTED NOT DETECTED Final   Respiratory Syncytial Virus NOT DETECTED NOT DETECTED Final   Bordetella pertussis NOT DETECTED NOT DETECTED Final   Chlamydophila pneumoniae NOT DETECTED NOT DETECTED Final   Mycoplasma pneumoniae NOT DETECTED NOT DETECTED Final    Comment: Performed at [facility], [place] [address] [place], [place] [address]     Time coordinating discharge: 32 mins  SIGNED:   Damita Lack, MD  [facility] 05/07/2017, 11:11 AM Pager   If 7PM-7AM, please contact  night-coverage [email or web address] Password [email or web address]

## 2017-05-07 NOTE — Progress Notes (Signed)
[facility] for warfarin Indication: atrial fibrillation   Assessment: 57 yof on warfarin PTA for afib. Pharmacy consulted to dose warfarin. Documentation from [facility] states warfarin "to be stopped 4/21." No clear indication for this med to be stopped - spoke with Dr. Thereasa Solo and will continue dosing at this time. INR 1.8 - last dose on 4/21 PTA. Hg 9.6, plt wnl. No bleed documented.  INR today = 2.41  PTA warfarin dose: 4mg  daily  Goal of Therapy:  INR 2-3 Monitor platelets by anticoagulation protocol: Yes   Plan:  Continue Warfarin 4 mg po daily Daily INR Monitor CBC, s/sx bleeding  Thank you Anette Guarneri, PharmD [phone] 05/07/2017 10:30 AM

## 2017-05-07 NOTE — Progress Notes (Signed)
Report called to [facility]. All questions answered.

## 2017-05-07 NOTE — Progress Notes (Signed)
Withheld miralax and senokot because patient having numerous bowel movements. Will continue to monitor. Lajoyce [place], RN

## 2017-05-07 NOTE — Progress Notes (Signed)
Patient will discharge to [facility] Anticipated discharge date:4/24 Family notified: pt dtr Furniture conservator/restorer by [facility]- scheduled for 1pm Report #: [phone] Rm 64B  CSW signing off.  Jorge Ny, LCSW Clinical Social Worker [phone]

## 2017-05-07 NOTE — Care Management Note (Signed)
Case Management Note Marvetta Gibbons RN, BSN Unit 4E-Case Manager [phone]  Patient Details  Name: Sarah Moss MRN: [id number] Date of Birth: [birth year]  Subjective/Objective:   Pt admitted with HCAP                 Action/Plan: PTA pt from [facility] to follow for return to SNF when medically stable  Expected Discharge Date:  05/07/17               Expected Discharge Plan:  [place]  In-House Referral:  Clinical Social Work  Discharge planning Services  CM Consult  Post Acute Care Choice:    Choice offered to:     DME Arranged:    DME Agency:     HH Arranged:    [place] Agency:     Status of Service:  Completed, signed off  If discussed at [employer], dates discussed:    Discharge Disposition: skilled facility   Additional Comments:  05/07/17- 34- Kwadwo Taras RN, CM- pt stable for return to SNF today- CSW following for return to [facility] today  Dahlia Client, [employer], RN 05/07/2017, 11:24 AM

## 2017-05-08 LAB — CUP PACEART REMOTE DEVICE CHECK
Implantable Lead Implant Date: 20161205
Implantable Lead Location: 753859
Lead Channel Setting Pacing Amplitude: 2.5 V
Lead Channel Setting Sensing Sensitivity: 4 mV
MDC IDC LEAD IMPLANT DT: 20161205
MDC IDC LEAD LOCATION: 753860
MDC IDC PG IMPLANT DT: 20161205
MDC IDC PG SERIAL: 7843315
MDC IDC SESS DTM: 20190425081814
MDC IDC SET LEADCHNL RV PACING PULSEWIDTH: 0.4 ms

## 2017-05-10 LAB — CULTURE, BLOOD (ROUTINE X 2)
CULTURE: NO GROWTH
Culture: NO GROWTH
SPECIAL REQUESTS: ADEQUATE
SPECIAL REQUESTS: ADEQUATE

## 2017-06-27 ENCOUNTER — Encounter (HOSPITAL_COMMUNITY): Payer: Medicare HMO

## 2017-06-27 ENCOUNTER — Ambulatory Visit: Payer: Medicare HMO | Admitting: Family

## 2017-07-15 ENCOUNTER — Encounter: Payer: Medicare HMO | Admitting: *Deleted

## 2017-07-15 ENCOUNTER — Telehealth: Payer: Self-pay | Admitting: Cardiology

## 2017-07-15 NOTE — Telephone Encounter (Signed)
Confirmed remote transmission w/ pt daughter.   

## 2017-07-16 ENCOUNTER — Encounter: Payer: Self-pay | Admitting: Cardiology

## 2017-07-22 ENCOUNTER — Telehealth: Payer: Self-pay | Admitting: Cardiology

## 2017-07-22 NOTE — Telephone Encounter (Signed)
Called [facility] x 2. No answer.

## 2017-07-22 NOTE — Telephone Encounter (Signed)
New Message:   Pt is in [facility] in [place] They need instructions on how to send transmission. The phone number is [phone].

## 2017-07-23 NOTE — Telephone Encounter (Signed)
Spoke with front desk who stated that nurses were in a CPR training, asked if there was a fax number to fax directions to send a transmission she provided fax number to unit that pt is in monitor instructions faxed to [phone]

## 2017-07-23 NOTE — Telephone Encounter (Signed)
Spoke with nurse @ [facility] she stated that she was going to an in-service and to call back in a couple of hours she would have time

## 2017-07-23 NOTE — Telephone Encounter (Signed)
LVM at [facility] to call back to send remote transmission direct number to device clinic given.

## 2017-07-24 NOTE — Telephone Encounter (Signed)
Informed patient's nurse that monitor instructions were faxed to the facility yesterday. Asked RN to follow the instructions to send the remote. RN verbalized understanding and plans to send later today.

## 2017-07-25 NOTE — Telephone Encounter (Signed)
Remote has not been received. Called nursing facility - on hold x 29mins.  Will try again next week.

## 2017-08-07 ENCOUNTER — Encounter (HOSPITAL_COMMUNITY): Payer: Medicare HMO

## 2017-08-07 ENCOUNTER — Ambulatory Visit: Payer: Medicare HMO | Admitting: Family

## 2017-08-12 NOTE — Telephone Encounter (Signed)
Called and asked RN to send remote. Verbal instructions provided. RN verbalized understanding.   Encouraged RN to call back if she has any issues with sending remote. Direct number given.

## 2017-08-25 ENCOUNTER — Ambulatory Visit (INDEPENDENT_AMBULATORY_CARE_PROVIDER_SITE_OTHER): Payer: Medicare HMO | Admitting: *Deleted

## 2017-08-25 DIAGNOSIS — I495 Sick sinus syndrome: Secondary | ICD-10-CM | POA: Diagnosis not present

## 2017-08-25 NOTE — Progress Notes (Signed)
Remote pacemaker transmission.   

## 2017-08-26 ENCOUNTER — Encounter: Payer: Self-pay | Admitting: Cardiology

## 2017-09-09 ENCOUNTER — Encounter: Payer: Medicare HMO | Admitting: Internal Medicine

## 2017-09-18 LAB — CUP PACEART REMOTE DEVICE CHECK
Battery Remaining Percentage: 95.5 %
Battery Voltage: 2.99 V
Implantable Lead Implant Date: 20161205
Implantable Lead Location: 753859
Implantable Pulse Generator Implant Date: 20161205
Lead Channel Impedance Value: 410 Ohm
Lead Channel Pacing Threshold Amplitude: 0.75 V
Lead Channel Pacing Threshold Pulse Width: 0.4 ms
Lead Channel Setting Pacing Amplitude: 2.5 V
Lead Channel Setting Pacing Pulse Width: 0.4 ms
MDC IDC LEAD IMPLANT DT: 20161205
MDC IDC LEAD LOCATION: 753860
MDC IDC MSMT BATTERY REMAINING LONGEVITY: 114 mo
MDC IDC MSMT LEADCHNL RV SENSING INTR AMPL: 12 mV
MDC IDC SESS DTM: 20190810231527
MDC IDC SET LEADCHNL RV SENSING SENSITIVITY: 4 mV
MDC IDC STAT BRADY RV PERCENT PACED: 99 %
Pulse Gen Model: 2240
Pulse Gen Serial Number: 7843315

## 2017-10-02 ENCOUNTER — Inpatient Hospital Stay (HOSPITAL_COMMUNITY): Admission: RE | Admit: 2017-10-02 | Payer: Medicare HMO | Source: Ambulatory Visit

## 2017-10-02 ENCOUNTER — Encounter (HOSPITAL_COMMUNITY): Payer: Medicare HMO

## 2017-10-02 ENCOUNTER — Ambulatory Visit: Payer: Medicare HMO | Admitting: Family

## 2017-11-06 ENCOUNTER — Encounter (HOSPITAL_COMMUNITY): Payer: Medicare HMO

## 2017-11-06 ENCOUNTER — Ambulatory Visit: Payer: Medicare HMO | Admitting: Family

## 2017-11-24 ENCOUNTER — Telehealth: Payer: Self-pay | Admitting: Cardiology

## 2017-11-24 ENCOUNTER — Encounter: Payer: Medicare HMO | Admitting: *Deleted

## 2017-11-24 NOTE — Telephone Encounter (Signed)
Confirmed remote transmission w/ pt daughter.   

## 2017-11-26 ENCOUNTER — Encounter: Payer: Self-pay | Admitting: Cardiology

## 2017-12-17 IMAGING — CR DG CHEST 2V
2 series · 2 of 2 positions shown · non-contrast
Comparison: 03/05/2016

CLINICAL DATA: Generalized weakness, shortness of breath for 2 days

EXAM:
CHEST  2 VIEW

[x chest ap]
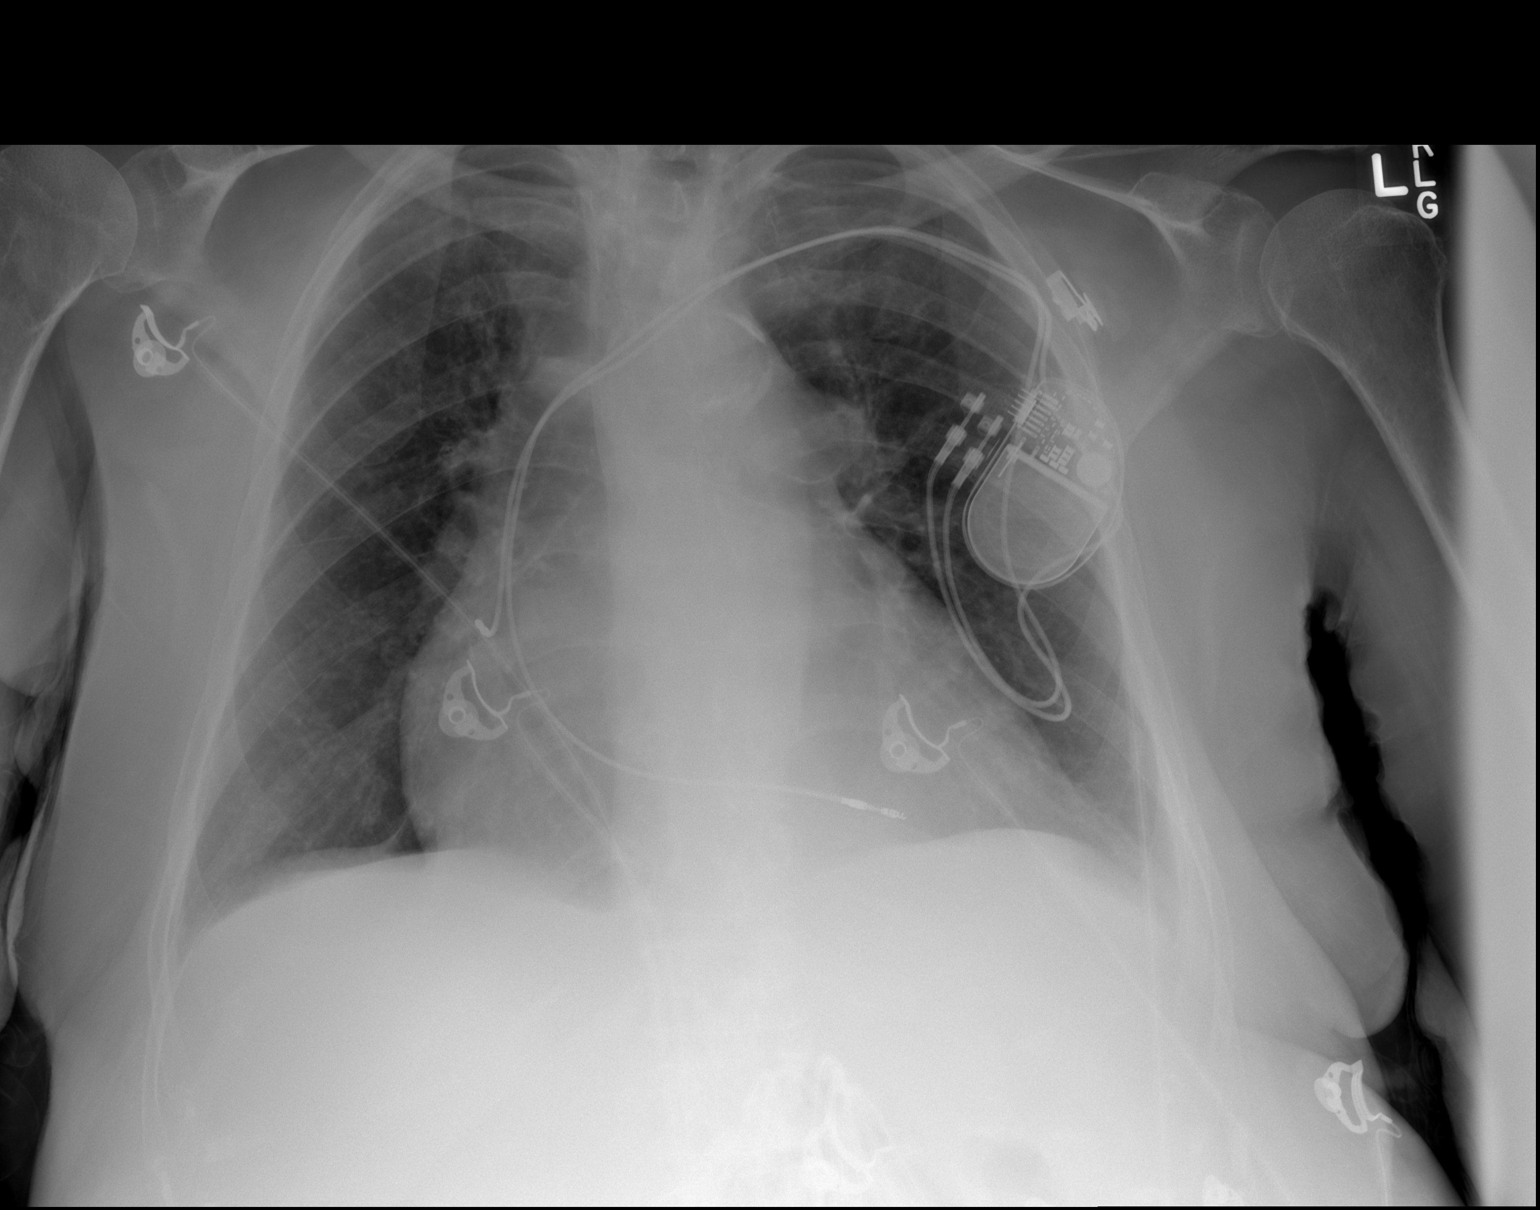

[w chest lat]
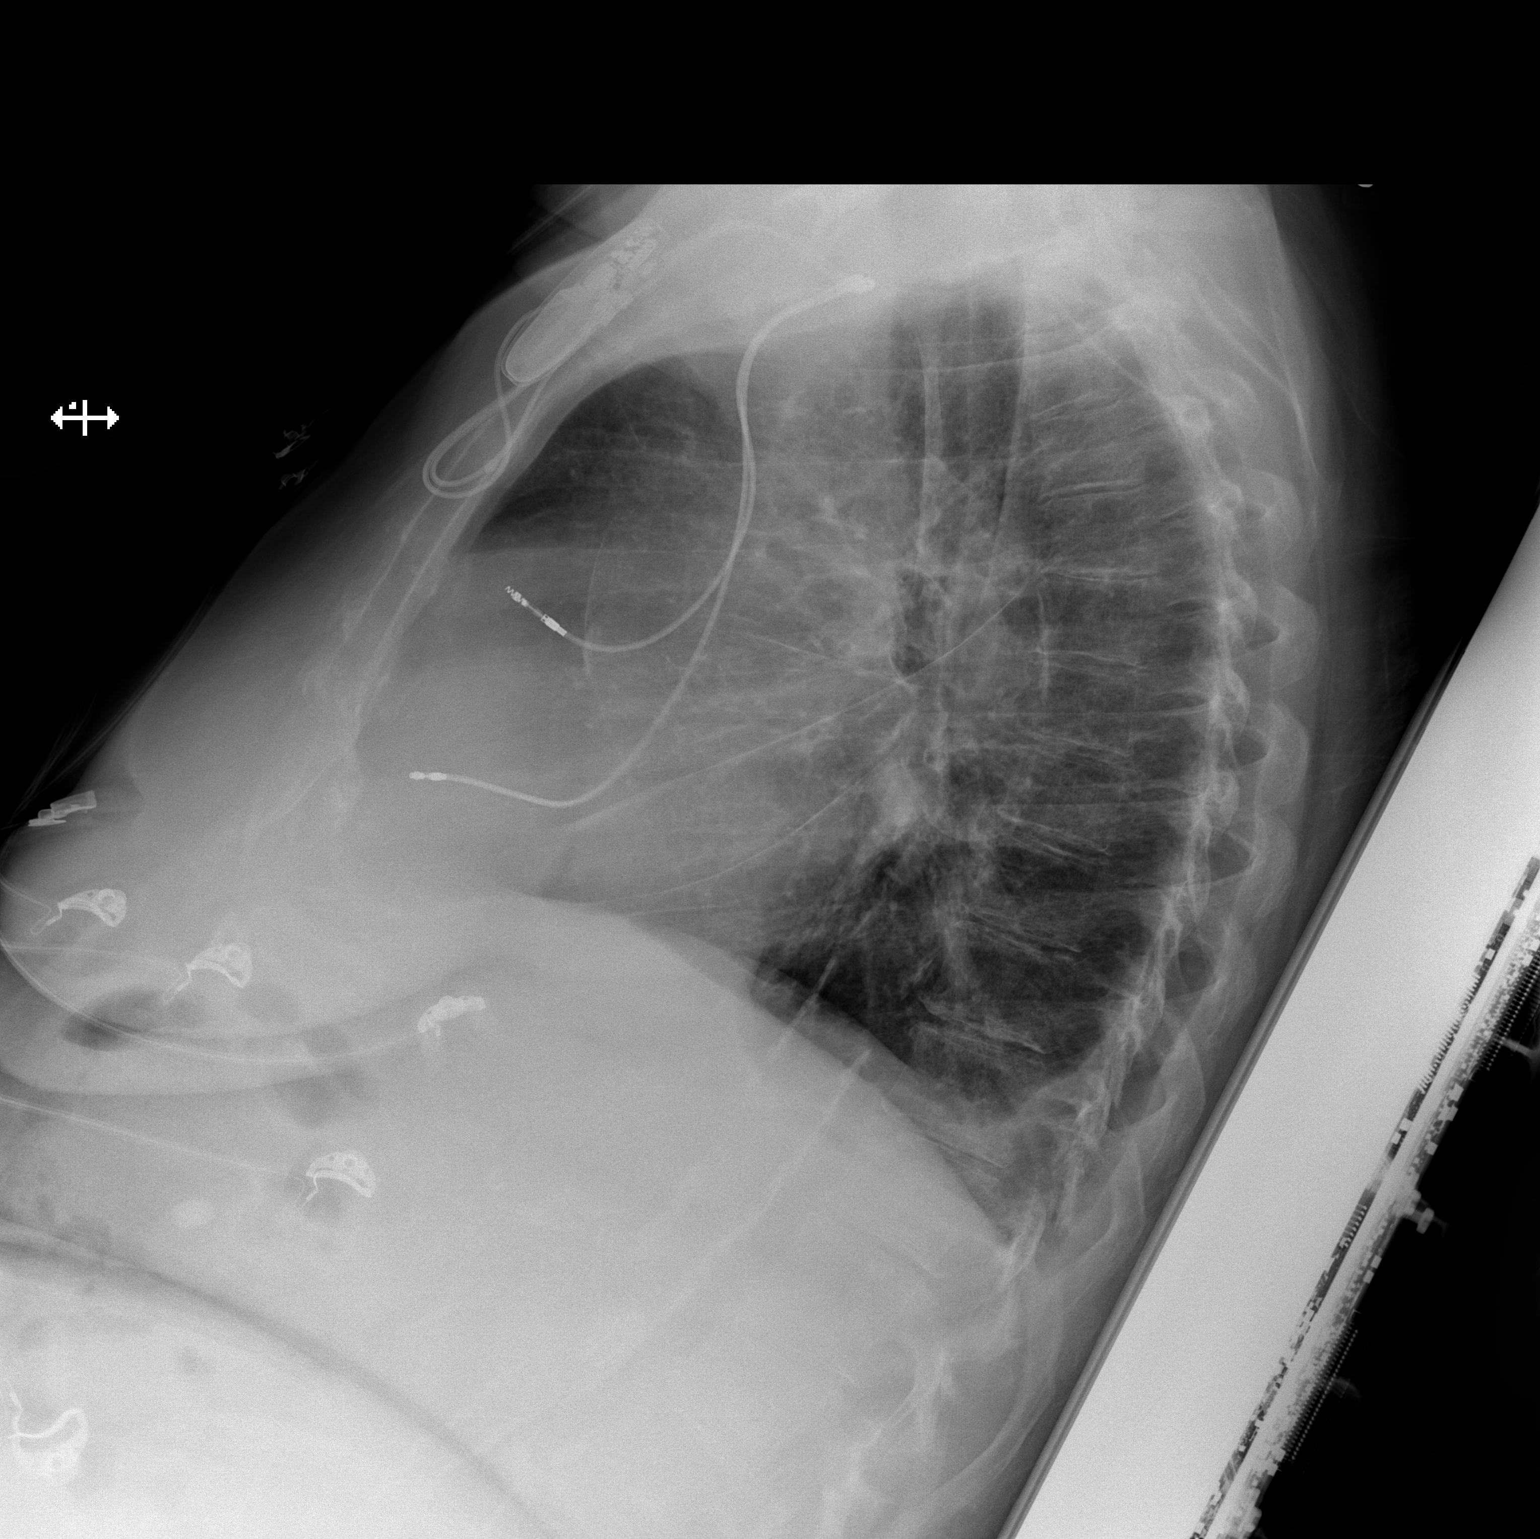

[2 of 2 positions shown; findings below may reference images not displayed]

FINDINGS: Cardiomegaly again noted. Dual lead cardiac pacemaker is unchanged
in position. Central mild vascular congestion without convincing
pulmonary edema. Stable trace bilateral pleural effusion with
basilar atelectasis. Osteopenia and mild degenerative changes
thoracic spine. Atherosclerotic calcifications of thoracic aorta. No
segmental infiltrate.
IMPRESSION: Central mild vascular congestion without convincing pulmonary edema.
Stable trace bilateral pleural effusion with basilar atelectasis. No
segmental infiltrates.

## 2018-04-25 IMAGING — CR DG LUMBAR SPINE COMPLETE 4+V
5 series · 5 of 5 positions shown · non-contrast
Comparison: CT abdomen pelvis 07/24/2016

CLINICAL DATA: Low back pain

EXAM:
LUMBAR SPINE - COMPLETE 4+ VIEW

[l-spine ap]
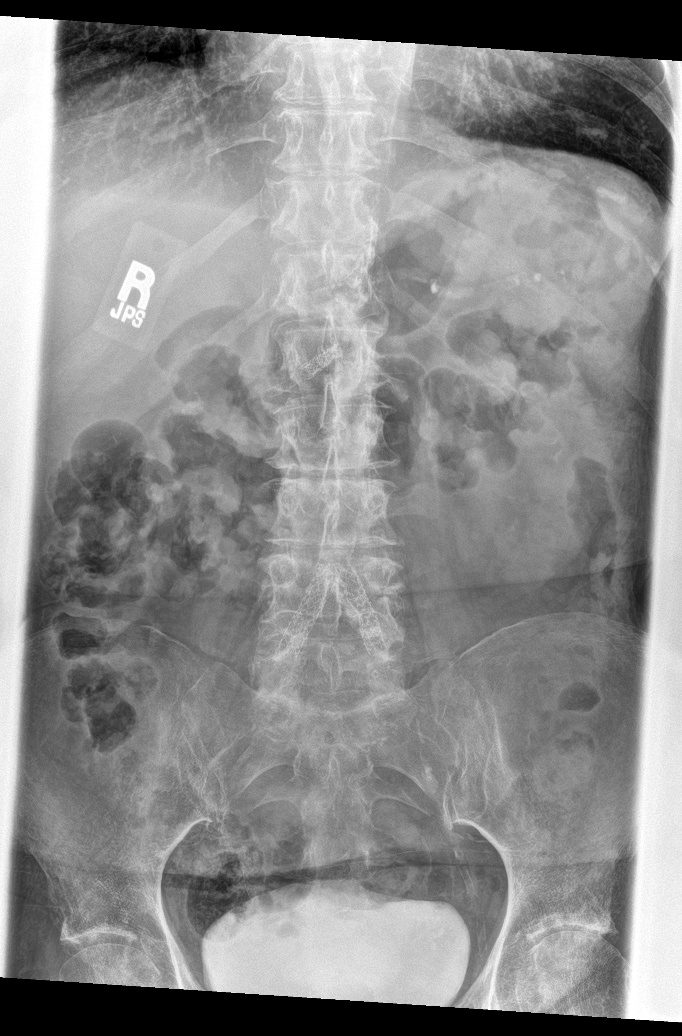

[l-spine obl (1 of 2)]
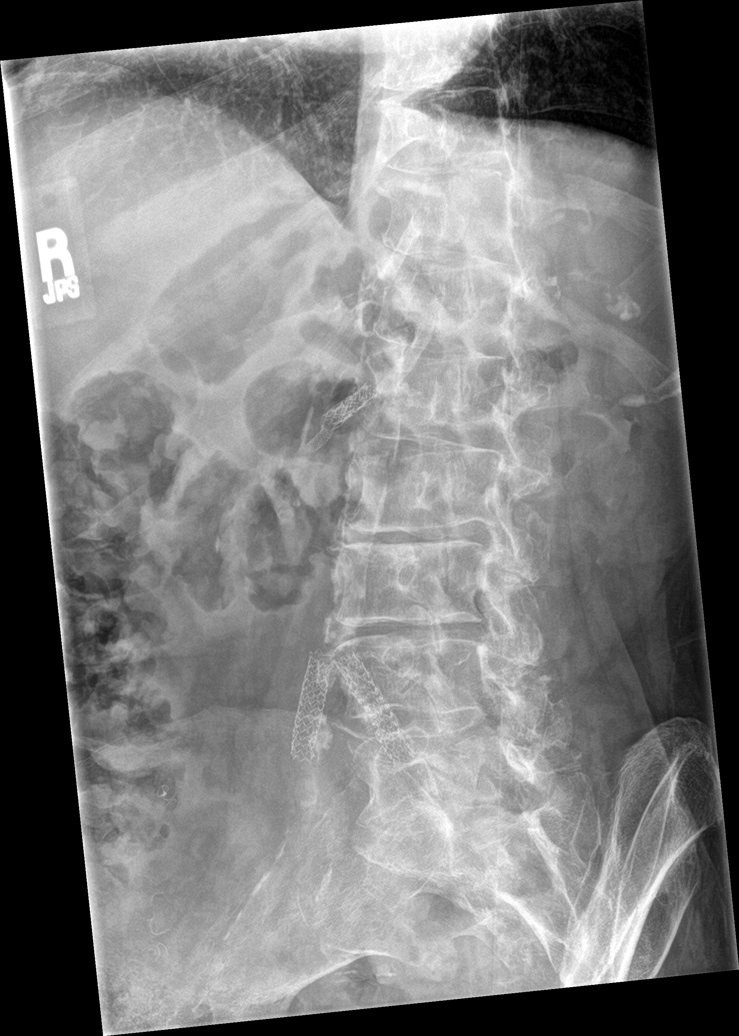

[l-spine obl (2 of 2)]
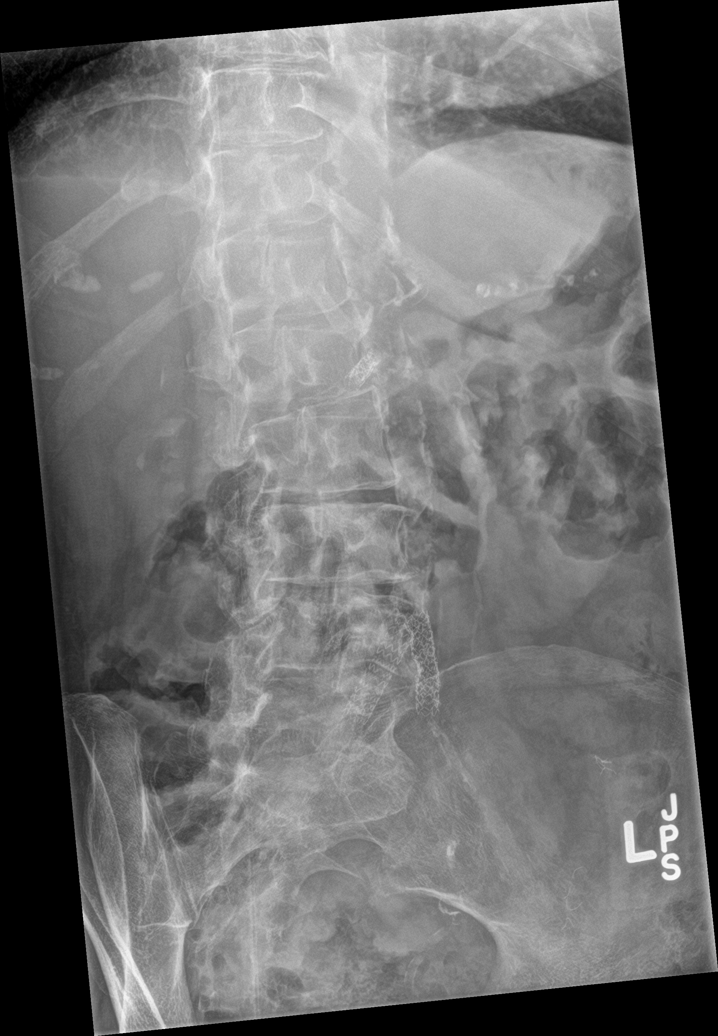

[l-spine lat]
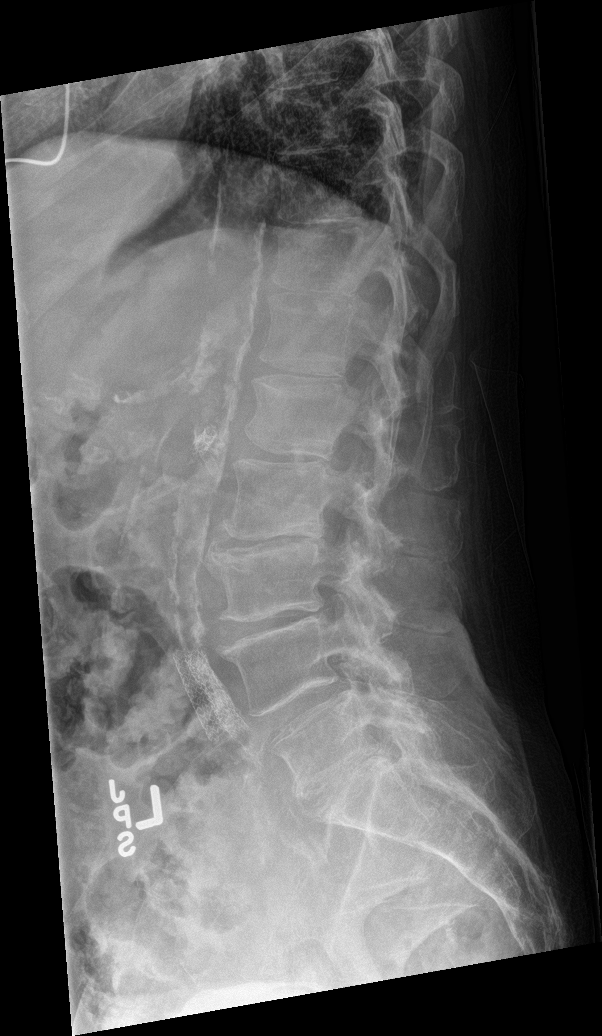

[l-spine spot]
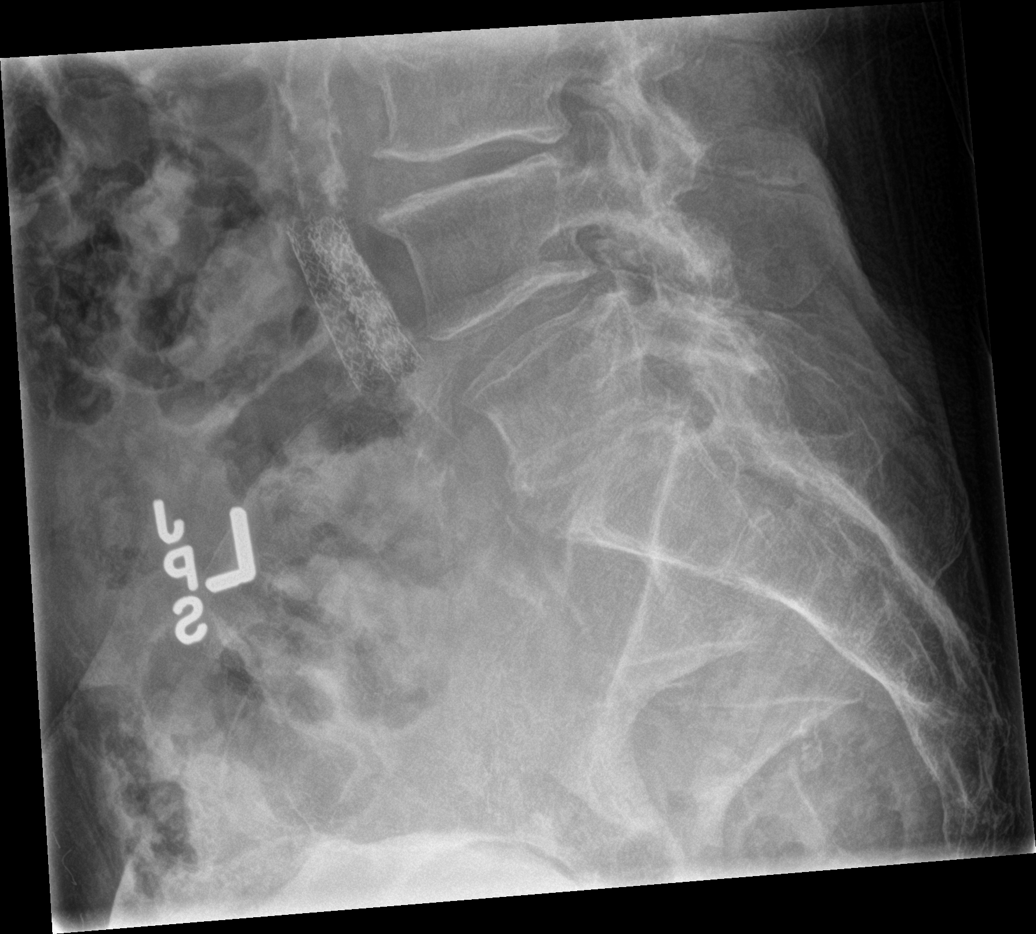

[5 of 5 positions shown; findings below may reference images not displayed]

FINDINGS: Mild anterolisthesis L4-5. Advanced disc degeneration at L2-3 with
disc space narrowing and spurring. Moderate disc degeneration at
L1-2, L3-4, and L5-S1. Mild degenerative change L5-S1. Negative for
fracture.

Atherosclerotic disease. Right renal artery stent. Bilateral common
iliac artery stents. Normal bowel gas pattern. Contrast in urinary
bladder from recent CT. No hydronephrosis.
IMPRESSION: Moderately severe lumbar degenerative change most severe at L2-3 and
L5-S1. Negative for fracture.

## 2018-05-25 DIAGNOSIS — F0390 Unspecified dementia without behavioral disturbance: Secondary | ICD-10-CM | POA: Diagnosis not present

## 2018-05-25 DIAGNOSIS — J449 Chronic obstructive pulmonary disease, unspecified: Secondary | ICD-10-CM | POA: Diagnosis not present

## 2018-05-25 DIAGNOSIS — R4182 Altered mental status, unspecified: Secondary | ICD-10-CM | POA: Diagnosis not present

## 2018-05-25 DIAGNOSIS — I5042 Chronic combined systolic (congestive) and diastolic (congestive) heart failure: Secondary | ICD-10-CM | POA: Diagnosis not present

## 2018-06-03 DIAGNOSIS — F331 Major depressive disorder, recurrent, moderate: Secondary | ICD-10-CM | POA: Diagnosis not present

## 2018-06-03 DIAGNOSIS — G4700 Insomnia, unspecified: Secondary | ICD-10-CM | POA: Diagnosis not present

## 2018-06-03 DIAGNOSIS — F064 Anxiety disorder due to known physiological condition: Secondary | ICD-10-CM | POA: Diagnosis not present

## 2018-06-05 DIAGNOSIS — I739 Peripheral vascular disease, unspecified: Secondary | ICD-10-CM | POA: Diagnosis not present

## 2018-06-05 DIAGNOSIS — F0390 Unspecified dementia without behavioral disturbance: Secondary | ICD-10-CM | POA: Diagnosis not present

## 2018-06-05 DIAGNOSIS — E1151 Type 2 diabetes mellitus with diabetic peripheral angiopathy without gangrene: Secondary | ICD-10-CM | POA: Diagnosis not present

## 2018-06-05 DIAGNOSIS — I10 Essential (primary) hypertension: Secondary | ICD-10-CM | POA: Diagnosis not present

## 2018-06-05 DIAGNOSIS — Z89511 Acquired absence of right leg below knee: Secondary | ICD-10-CM | POA: Diagnosis not present

## 2018-06-05 DIAGNOSIS — J449 Chronic obstructive pulmonary disease, unspecified: Secondary | ICD-10-CM | POA: Diagnosis not present

## 2018-06-05 DIAGNOSIS — C439 Malignant melanoma of skin, unspecified: Secondary | ICD-10-CM | POA: Diagnosis not present

## 2018-06-05 DIAGNOSIS — D649 Anemia, unspecified: Secondary | ICD-10-CM | POA: Diagnosis not present

## 2018-06-05 DIAGNOSIS — I4891 Unspecified atrial fibrillation: Secondary | ICD-10-CM | POA: Diagnosis not present

## 2018-06-16 DIAGNOSIS — J449 Chronic obstructive pulmonary disease, unspecified: Secondary | ICD-10-CM | POA: Diagnosis not present

## 2018-06-16 DIAGNOSIS — R21 Rash and other nonspecific skin eruption: Secondary | ICD-10-CM | POA: Diagnosis not present

## 2018-06-16 DIAGNOSIS — H1011 Acute atopic conjunctivitis, right eye: Secondary | ICD-10-CM | POA: Diagnosis not present

## 2018-06-16 DIAGNOSIS — I5042 Chronic combined systolic (congestive) and diastolic (congestive) heart failure: Secondary | ICD-10-CM | POA: Diagnosis not present

## 2018-07-01 DIAGNOSIS — F064 Anxiety disorder due to known physiological condition: Secondary | ICD-10-CM | POA: Diagnosis not present

## 2018-07-01 DIAGNOSIS — F331 Major depressive disorder, recurrent, moderate: Secondary | ICD-10-CM | POA: Diagnosis not present

## 2018-07-01 DIAGNOSIS — G4700 Insomnia, unspecified: Secondary | ICD-10-CM | POA: Diagnosis not present

## 2018-07-03 DIAGNOSIS — I10 Essential (primary) hypertension: Secondary | ICD-10-CM | POA: Diagnosis not present

## 2018-07-03 DIAGNOSIS — F0390 Unspecified dementia without behavioral disturbance: Secondary | ICD-10-CM | POA: Diagnosis not present

## 2018-07-03 DIAGNOSIS — I4891 Unspecified atrial fibrillation: Secondary | ICD-10-CM | POA: Diagnosis not present

## 2018-07-03 DIAGNOSIS — I739 Peripheral vascular disease, unspecified: Secondary | ICD-10-CM | POA: Diagnosis not present

## 2018-07-03 DIAGNOSIS — C439 Malignant melanoma of skin, unspecified: Secondary | ICD-10-CM | POA: Diagnosis not present

## 2018-07-03 DIAGNOSIS — N183 Chronic kidney disease, stage 3 (moderate): Secondary | ICD-10-CM | POA: Diagnosis not present

## 2018-07-03 DIAGNOSIS — D649 Anemia, unspecified: Secondary | ICD-10-CM | POA: Diagnosis not present

## 2018-07-03 DIAGNOSIS — R269 Unspecified abnormalities of gait and mobility: Secondary | ICD-10-CM | POA: Diagnosis not present

## 2018-07-03 DIAGNOSIS — J449 Chronic obstructive pulmonary disease, unspecified: Secondary | ICD-10-CM | POA: Diagnosis not present

## 2018-07-07 DIAGNOSIS — F064 Anxiety disorder due to known physiological condition: Secondary | ICD-10-CM | POA: Diagnosis not present

## 2018-07-07 DIAGNOSIS — I739 Peripheral vascular disease, unspecified: Secondary | ICD-10-CM | POA: Diagnosis not present

## 2018-07-07 DIAGNOSIS — I4891 Unspecified atrial fibrillation: Secondary | ICD-10-CM | POA: Diagnosis not present

## 2018-07-07 DIAGNOSIS — G4700 Insomnia, unspecified: Secondary | ICD-10-CM | POA: Diagnosis not present

## 2018-07-07 DIAGNOSIS — E1140 Type 2 diabetes mellitus with diabetic neuropathy, unspecified: Secondary | ICD-10-CM | POA: Diagnosis not present

## 2018-07-07 DIAGNOSIS — H1011 Acute atopic conjunctivitis, right eye: Secondary | ICD-10-CM | POA: Diagnosis not present

## 2018-07-07 DIAGNOSIS — F331 Major depressive disorder, recurrent, moderate: Secondary | ICD-10-CM | POA: Diagnosis not present

## 2018-07-15 DIAGNOSIS — Z20828 Contact with and (suspected) exposure to other viral communicable diseases: Secondary | ICD-10-CM | POA: Diagnosis not present

## 2018-07-22 DIAGNOSIS — Z20828 Contact with and (suspected) exposure to other viral communicable diseases: Secondary | ICD-10-CM | POA: Diagnosis not present

## 2018-07-29 DIAGNOSIS — Z20828 Contact with and (suspected) exposure to other viral communicable diseases: Secondary | ICD-10-CM | POA: Diagnosis not present

## 2018-07-31 DIAGNOSIS — E1151 Type 2 diabetes mellitus with diabetic peripheral angiopathy without gangrene: Secondary | ICD-10-CM | POA: Diagnosis not present

## 2018-07-31 DIAGNOSIS — J449 Chronic obstructive pulmonary disease, unspecified: Secondary | ICD-10-CM | POA: Diagnosis not present

## 2018-07-31 DIAGNOSIS — F0390 Unspecified dementia without behavioral disturbance: Secondary | ICD-10-CM | POA: Diagnosis not present

## 2018-07-31 DIAGNOSIS — C439 Malignant melanoma of skin, unspecified: Secondary | ICD-10-CM | POA: Diagnosis not present

## 2018-07-31 DIAGNOSIS — D649 Anemia, unspecified: Secondary | ICD-10-CM | POA: Diagnosis not present

## 2018-07-31 DIAGNOSIS — I739 Peripheral vascular disease, unspecified: Secondary | ICD-10-CM | POA: Diagnosis not present

## 2018-07-31 DIAGNOSIS — E118 Type 2 diabetes mellitus with unspecified complications: Secondary | ICD-10-CM | POA: Diagnosis not present

## 2018-07-31 DIAGNOSIS — I4891 Unspecified atrial fibrillation: Secondary | ICD-10-CM | POA: Diagnosis not present

## 2018-07-31 DIAGNOSIS — Z89511 Acquired absence of right leg below knee: Secondary | ICD-10-CM | POA: Diagnosis not present

## 2018-07-31 DIAGNOSIS — I10 Essential (primary) hypertension: Secondary | ICD-10-CM | POA: Diagnosis not present

## 2018-08-05 DIAGNOSIS — F5101 Primary insomnia: Secondary | ICD-10-CM | POA: Diagnosis not present

## 2018-08-05 DIAGNOSIS — F064 Anxiety disorder due to known physiological condition: Secondary | ICD-10-CM | POA: Diagnosis not present

## 2018-08-05 DIAGNOSIS — F331 Major depressive disorder, recurrent, moderate: Secondary | ICD-10-CM | POA: Diagnosis not present

## 2018-08-05 DIAGNOSIS — Z20828 Contact with and (suspected) exposure to other viral communicable diseases: Secondary | ICD-10-CM | POA: Diagnosis not present

## 2018-08-26 DIAGNOSIS — I739 Peripheral vascular disease, unspecified: Secondary | ICD-10-CM | POA: Diagnosis not present

## 2018-08-26 DIAGNOSIS — W19XXXA Unspecified fall, initial encounter: Secondary | ICD-10-CM | POA: Diagnosis not present

## 2018-08-26 DIAGNOSIS — I5042 Chronic combined systolic (congestive) and diastolic (congestive) heart failure: Secondary | ICD-10-CM | POA: Diagnosis not present

## 2018-08-26 DIAGNOSIS — J449 Chronic obstructive pulmonary disease, unspecified: Secondary | ICD-10-CM | POA: Diagnosis not present

## 2018-09-01 DIAGNOSIS — F331 Major depressive disorder, recurrent, moderate: Secondary | ICD-10-CM | POA: Diagnosis not present

## 2018-09-01 DIAGNOSIS — F064 Anxiety disorder due to known physiological condition: Secondary | ICD-10-CM | POA: Diagnosis not present

## 2018-09-01 DIAGNOSIS — G4701 Insomnia due to medical condition: Secondary | ICD-10-CM | POA: Diagnosis not present

## 2018-09-04 DIAGNOSIS — J449 Chronic obstructive pulmonary disease, unspecified: Secondary | ICD-10-CM | POA: Diagnosis not present

## 2018-09-04 DIAGNOSIS — I10 Essential (primary) hypertension: Secondary | ICD-10-CM | POA: Diagnosis not present

## 2018-09-04 DIAGNOSIS — Z89511 Acquired absence of right leg below knee: Secondary | ICD-10-CM | POA: Diagnosis not present

## 2018-09-04 DIAGNOSIS — C439 Malignant melanoma of skin, unspecified: Secondary | ICD-10-CM | POA: Diagnosis not present

## 2018-09-04 DIAGNOSIS — I739 Peripheral vascular disease, unspecified: Secondary | ICD-10-CM | POA: Diagnosis not present

## 2018-09-04 DIAGNOSIS — I4891 Unspecified atrial fibrillation: Secondary | ICD-10-CM | POA: Diagnosis not present

## 2018-09-04 DIAGNOSIS — D649 Anemia, unspecified: Secondary | ICD-10-CM | POA: Diagnosis not present

## 2018-09-04 DIAGNOSIS — F0390 Unspecified dementia without behavioral disturbance: Secondary | ICD-10-CM | POA: Diagnosis not present

## 2018-09-04 DIAGNOSIS — E1151 Type 2 diabetes mellitus with diabetic peripheral angiopathy without gangrene: Secondary | ICD-10-CM | POA: Diagnosis not present

## 2018-10-01 DIAGNOSIS — I5042 Chronic combined systolic (congestive) and diastolic (congestive) heart failure: Secondary | ICD-10-CM | POA: Diagnosis not present

## 2018-10-01 DIAGNOSIS — J449 Chronic obstructive pulmonary disease, unspecified: Secondary | ICD-10-CM | POA: Diagnosis not present

## 2018-10-01 DIAGNOSIS — I739 Peripheral vascular disease, unspecified: Secondary | ICD-10-CM | POA: Diagnosis not present

## 2018-10-01 DIAGNOSIS — E1140 Type 2 diabetes mellitus with diabetic neuropathy, unspecified: Secondary | ICD-10-CM | POA: Diagnosis not present

## 2018-10-02 DIAGNOSIS — J449 Chronic obstructive pulmonary disease, unspecified: Secondary | ICD-10-CM | POA: Diagnosis not present

## 2018-10-02 DIAGNOSIS — F0390 Unspecified dementia without behavioral disturbance: Secondary | ICD-10-CM | POA: Diagnosis not present

## 2018-10-02 DIAGNOSIS — I10 Essential (primary) hypertension: Secondary | ICD-10-CM | POA: Diagnosis not present

## 2018-10-02 DIAGNOSIS — I739 Peripheral vascular disease, unspecified: Secondary | ICD-10-CM | POA: Diagnosis not present

## 2018-10-02 DIAGNOSIS — Z89511 Acquired absence of right leg below knee: Secondary | ICD-10-CM | POA: Diagnosis not present

## 2018-10-02 DIAGNOSIS — E1151 Type 2 diabetes mellitus with diabetic peripheral angiopathy without gangrene: Secondary | ICD-10-CM | POA: Diagnosis not present

## 2018-10-02 DIAGNOSIS — I4891 Unspecified atrial fibrillation: Secondary | ICD-10-CM | POA: Diagnosis not present

## 2018-10-02 DIAGNOSIS — D649 Anemia, unspecified: Secondary | ICD-10-CM | POA: Diagnosis not present

## 2018-10-02 DIAGNOSIS — R1111 Vomiting without nausea: Secondary | ICD-10-CM | POA: Diagnosis not present

## 2018-10-02 DIAGNOSIS — C439 Malignant melanoma of skin, unspecified: Secondary | ICD-10-CM | POA: Diagnosis not present

## 2018-10-03 DIAGNOSIS — D649 Anemia, unspecified: Secondary | ICD-10-CM | POA: Diagnosis not present

## 2018-10-03 DIAGNOSIS — E118 Type 2 diabetes mellitus with unspecified complications: Secondary | ICD-10-CM | POA: Diagnosis not present

## 2018-10-03 DIAGNOSIS — I509 Heart failure, unspecified: Secondary | ICD-10-CM | POA: Diagnosis not present

## 2018-10-05 DIAGNOSIS — J449 Chronic obstructive pulmonary disease, unspecified: Secondary | ICD-10-CM | POA: Diagnosis not present

## 2018-10-05 DIAGNOSIS — I5042 Chronic combined systolic (congestive) and diastolic (congestive) heart failure: Secondary | ICD-10-CM | POA: Diagnosis not present

## 2018-10-05 DIAGNOSIS — R1110 Vomiting, unspecified: Secondary | ICD-10-CM | POA: Diagnosis not present

## 2018-10-05 DIAGNOSIS — R0602 Shortness of breath: Secondary | ICD-10-CM | POA: Diagnosis not present

## 2018-10-05 DIAGNOSIS — R0600 Dyspnea, unspecified: Secondary | ICD-10-CM | POA: Diagnosis not present

## 2018-10-06 DIAGNOSIS — R1110 Vomiting, unspecified: Secondary | ICD-10-CM | POA: Diagnosis not present

## 2018-10-06 DIAGNOSIS — I5042 Chronic combined systolic (congestive) and diastolic (congestive) heart failure: Secondary | ICD-10-CM | POA: Diagnosis not present

## 2018-10-06 DIAGNOSIS — J449 Chronic obstructive pulmonary disease, unspecified: Secondary | ICD-10-CM | POA: Diagnosis not present

## 2018-10-06 DIAGNOSIS — J189 Pneumonia, unspecified organism: Secondary | ICD-10-CM | POA: Diagnosis not present

## 2018-10-07 DIAGNOSIS — J449 Chronic obstructive pulmonary disease, unspecified: Secondary | ICD-10-CM | POA: Diagnosis not present

## 2018-10-07 DIAGNOSIS — I739 Peripheral vascular disease, unspecified: Secondary | ICD-10-CM | POA: Diagnosis not present

## 2018-10-07 DIAGNOSIS — J189 Pneumonia, unspecified organism: Secondary | ICD-10-CM | POA: Diagnosis not present

## 2018-10-07 DIAGNOSIS — R1110 Vomiting, unspecified: Secondary | ICD-10-CM | POA: Diagnosis not present

## 2018-10-08 DIAGNOSIS — J189 Pneumonia, unspecified organism: Secondary | ICD-10-CM | POA: Diagnosis not present

## 2018-10-08 DIAGNOSIS — J449 Chronic obstructive pulmonary disease, unspecified: Secondary | ICD-10-CM | POA: Diagnosis not present

## 2018-10-08 DIAGNOSIS — R1110 Vomiting, unspecified: Secondary | ICD-10-CM | POA: Diagnosis not present

## 2018-10-08 DIAGNOSIS — I5042 Chronic combined systolic (congestive) and diastolic (congestive) heart failure: Secondary | ICD-10-CM | POA: Diagnosis not present

## 2018-10-09 DIAGNOSIS — R1110 Vomiting, unspecified: Secondary | ICD-10-CM | POA: Diagnosis not present

## 2018-10-09 DIAGNOSIS — I739 Peripheral vascular disease, unspecified: Secondary | ICD-10-CM | POA: Diagnosis not present

## 2018-10-09 DIAGNOSIS — J449 Chronic obstructive pulmonary disease, unspecified: Secondary | ICD-10-CM | POA: Diagnosis not present

## 2018-10-09 DIAGNOSIS — J189 Pneumonia, unspecified organism: Secondary | ICD-10-CM | POA: Diagnosis not present

## 2018-10-13 DIAGNOSIS — G4701 Insomnia due to medical condition: Secondary | ICD-10-CM | POA: Diagnosis not present

## 2018-10-13 DIAGNOSIS — F064 Anxiety disorder due to known physiological condition: Secondary | ICD-10-CM | POA: Diagnosis not present

## 2018-10-13 DIAGNOSIS — F331 Major depressive disorder, recurrent, moderate: Secondary | ICD-10-CM | POA: Diagnosis not present

## 2018-10-15 DIAGNOSIS — Z20828 Contact with and (suspected) exposure to other viral communicable diseases: Secondary | ICD-10-CM | POA: Diagnosis not present

## 2018-10-21 DIAGNOSIS — Z20828 Contact with and (suspected) exposure to other viral communicable diseases: Secondary | ICD-10-CM | POA: Diagnosis not present

## 2018-10-23 DIAGNOSIS — I10 Essential (primary) hypertension: Secondary | ICD-10-CM | POA: Diagnosis not present

## 2018-10-23 DIAGNOSIS — E785 Hyperlipidemia, unspecified: Secondary | ICD-10-CM | POA: Diagnosis not present

## 2018-10-23 DIAGNOSIS — D649 Anemia, unspecified: Secondary | ICD-10-CM | POA: Diagnosis not present

## 2018-10-23 DIAGNOSIS — E119 Type 2 diabetes mellitus without complications: Secondary | ICD-10-CM | POA: Diagnosis not present

## 2018-10-27 DIAGNOSIS — G4701 Insomnia due to medical condition: Secondary | ICD-10-CM | POA: Diagnosis not present

## 2018-10-27 DIAGNOSIS — F064 Anxiety disorder due to known physiological condition: Secondary | ICD-10-CM | POA: Diagnosis not present

## 2018-10-27 DIAGNOSIS — F331 Major depressive disorder, recurrent, moderate: Secondary | ICD-10-CM | POA: Diagnosis not present

## 2018-11-03 DIAGNOSIS — Z20828 Contact with and (suspected) exposure to other viral communicable diseases: Secondary | ICD-10-CM | POA: Diagnosis not present

## 2018-11-06 DIAGNOSIS — D649 Anemia, unspecified: Secondary | ICD-10-CM | POA: Diagnosis not present

## 2018-11-06 DIAGNOSIS — E1151 Type 2 diabetes mellitus with diabetic peripheral angiopathy without gangrene: Secondary | ICD-10-CM | POA: Diagnosis not present

## 2018-11-06 DIAGNOSIS — Z89511 Acquired absence of right leg below knee: Secondary | ICD-10-CM | POA: Diagnosis not present

## 2018-11-06 DIAGNOSIS — E509 Vitamin A deficiency, unspecified: Secondary | ICD-10-CM | POA: Diagnosis not present

## 2018-11-06 DIAGNOSIS — I739 Peripheral vascular disease, unspecified: Secondary | ICD-10-CM | POA: Diagnosis not present

## 2018-11-06 DIAGNOSIS — J449 Chronic obstructive pulmonary disease, unspecified: Secondary | ICD-10-CM | POA: Diagnosis not present

## 2018-11-06 DIAGNOSIS — I4891 Unspecified atrial fibrillation: Secondary | ICD-10-CM | POA: Diagnosis not present

## 2018-11-06 DIAGNOSIS — F0390 Unspecified dementia without behavioral disturbance: Secondary | ICD-10-CM | POA: Diagnosis not present

## 2018-11-06 DIAGNOSIS — C439 Malignant melanoma of skin, unspecified: Secondary | ICD-10-CM | POA: Diagnosis not present

## 2018-11-08 DIAGNOSIS — R0602 Shortness of breath: Secondary | ICD-10-CM | POA: Diagnosis not present

## 2018-11-08 DIAGNOSIS — I10 Essential (primary) hypertension: Secondary | ICD-10-CM | POA: Diagnosis not present

## 2018-11-08 DIAGNOSIS — D649 Anemia, unspecified: Secondary | ICD-10-CM | POA: Diagnosis not present

## 2018-11-09 DIAGNOSIS — I739 Peripheral vascular disease, unspecified: Secondary | ICD-10-CM | POA: Diagnosis not present

## 2018-11-09 DIAGNOSIS — J449 Chronic obstructive pulmonary disease, unspecified: Secondary | ICD-10-CM | POA: Diagnosis not present

## 2018-11-09 DIAGNOSIS — I4891 Unspecified atrial fibrillation: Secondary | ICD-10-CM | POA: Diagnosis not present

## 2018-11-09 DIAGNOSIS — I5042 Chronic combined systolic (congestive) and diastolic (congestive) heart failure: Secondary | ICD-10-CM | POA: Diagnosis not present

## 2018-11-10 DIAGNOSIS — G4701 Insomnia due to medical condition: Secondary | ICD-10-CM | POA: Diagnosis not present

## 2018-11-10 DIAGNOSIS — F064 Anxiety disorder due to known physiological condition: Secondary | ICD-10-CM | POA: Diagnosis not present

## 2018-11-10 DIAGNOSIS — Z20828 Contact with and (suspected) exposure to other viral communicable diseases: Secondary | ICD-10-CM | POA: Diagnosis not present

## 2018-11-10 DIAGNOSIS — F5101 Primary insomnia: Secondary | ICD-10-CM | POA: Diagnosis not present

## 2018-11-10 DIAGNOSIS — F331 Major depressive disorder, recurrent, moderate: Secondary | ICD-10-CM | POA: Diagnosis not present

## 2018-11-11 DIAGNOSIS — I4891 Unspecified atrial fibrillation: Secondary | ICD-10-CM | POA: Diagnosis not present

## 2018-11-11 DIAGNOSIS — I10 Essential (primary) hypertension: Secondary | ICD-10-CM | POA: Diagnosis not present

## 2018-11-11 DIAGNOSIS — I5043 Acute on chronic combined systolic (congestive) and diastolic (congestive) heart failure: Secondary | ICD-10-CM | POA: Diagnosis not present

## 2018-11-11 DIAGNOSIS — J449 Chronic obstructive pulmonary disease, unspecified: Secondary | ICD-10-CM | POA: Diagnosis not present

## 2018-11-14 DIAGNOSIS — I10 Essential (primary) hypertension: Secondary | ICD-10-CM | POA: Diagnosis not present

## 2018-11-14 DIAGNOSIS — D649 Anemia, unspecified: Secondary | ICD-10-CM | POA: Diagnosis not present

## 2018-11-14 DIAGNOSIS — R0602 Shortness of breath: Secondary | ICD-10-CM | POA: Diagnosis not present

## 2018-11-16 DIAGNOSIS — I4891 Unspecified atrial fibrillation: Secondary | ICD-10-CM | POA: Diagnosis not present

## 2018-11-16 DIAGNOSIS — J449 Chronic obstructive pulmonary disease, unspecified: Secondary | ICD-10-CM | POA: Diagnosis not present

## 2018-11-16 DIAGNOSIS — I739 Peripheral vascular disease, unspecified: Secondary | ICD-10-CM | POA: Diagnosis not present

## 2018-11-16 DIAGNOSIS — I5042 Chronic combined systolic (congestive) and diastolic (congestive) heart failure: Secondary | ICD-10-CM | POA: Diagnosis not present

## 2018-11-17 DIAGNOSIS — Z20828 Contact with and (suspected) exposure to other viral communicable diseases: Secondary | ICD-10-CM | POA: Diagnosis not present

## 2018-11-23 ENCOUNTER — Ambulatory Visit (INDEPENDENT_AMBULATORY_CARE_PROVIDER_SITE_OTHER): Payer: Medicare HMO | Admitting: *Deleted

## 2018-11-23 DIAGNOSIS — I509 Heart failure, unspecified: Secondary | ICD-10-CM

## 2018-11-23 DIAGNOSIS — I495 Sick sinus syndrome: Secondary | ICD-10-CM

## 2018-11-24 DIAGNOSIS — Z20828 Contact with and (suspected) exposure to other viral communicable diseases: Secondary | ICD-10-CM | POA: Diagnosis not present

## 2018-11-24 LAB — CUP PACEART REMOTE DEVICE CHECK
Battery Remaining Longevity: 113 mo
Battery Remaining Percentage: 95.5 %
Battery Voltage: 2.98 V
Brady Statistic RV Percent Paced: 99 %
Date Time Interrogation Session: 20201109070013
Implantable Lead Implant Date: 20161205
Implantable Lead Implant Date: 20161205
Implantable Lead Location: 753859
Implantable Lead Location: 753860
Implantable Pulse Generator Implant Date: 20161205
Lead Channel Impedance Value: 400 Ohm
Lead Channel Pacing Threshold Amplitude: 0.75 V
Lead Channel Pacing Threshold Pulse Width: 0.4 ms
Lead Channel Sensing Intrinsic Amplitude: 12 mV
Lead Channel Setting Pacing Amplitude: 2.5 V
Lead Channel Setting Pacing Pulse Width: 0.4 ms
Lead Channel Setting Sensing Sensitivity: 4 mV
Pulse Gen Model: 2240
Pulse Gen Serial Number: 7843315

## 2018-12-01 DIAGNOSIS — Z20828 Contact with and (suspected) exposure to other viral communicable diseases: Secondary | ICD-10-CM | POA: Diagnosis not present

## 2018-12-03 DIAGNOSIS — D649 Anemia, unspecified: Secondary | ICD-10-CM | POA: Diagnosis not present

## 2018-12-03 DIAGNOSIS — I10 Essential (primary) hypertension: Secondary | ICD-10-CM | POA: Diagnosis not present

## 2018-12-04 DIAGNOSIS — J101 Influenza due to other identified influenza virus with other respiratory manifestations: Secondary | ICD-10-CM | POA: Diagnosis not present

## 2018-12-04 DIAGNOSIS — E1140 Type 2 diabetes mellitus with diabetic neuropathy, unspecified: Secondary | ICD-10-CM | POA: Diagnosis not present

## 2018-12-04 DIAGNOSIS — J449 Chronic obstructive pulmonary disease, unspecified: Secondary | ICD-10-CM | POA: Diagnosis not present

## 2018-12-04 DIAGNOSIS — R1110 Vomiting, unspecified: Secondary | ICD-10-CM | POA: Diagnosis not present

## 2018-12-04 DIAGNOSIS — R1111 Vomiting without nausea: Secondary | ICD-10-CM | POA: Diagnosis not present

## 2018-12-04 DIAGNOSIS — I5042 Chronic combined systolic (congestive) and diastolic (congestive) heart failure: Secondary | ICD-10-CM | POA: Diagnosis not present

## 2018-12-06 DIAGNOSIS — I4891 Unspecified atrial fibrillation: Secondary | ICD-10-CM | POA: Diagnosis not present

## 2018-12-06 DIAGNOSIS — J449 Chronic obstructive pulmonary disease, unspecified: Secondary | ICD-10-CM | POA: Diagnosis not present

## 2018-12-06 DIAGNOSIS — I10 Essential (primary) hypertension: Secondary | ICD-10-CM | POA: Diagnosis not present

## 2018-12-06 DIAGNOSIS — F0390 Unspecified dementia without behavioral disturbance: Secondary | ICD-10-CM | POA: Diagnosis not present

## 2018-12-08 DIAGNOSIS — F331 Major depressive disorder, recurrent, moderate: Secondary | ICD-10-CM | POA: Diagnosis not present

## 2018-12-08 DIAGNOSIS — G4701 Insomnia due to medical condition: Secondary | ICD-10-CM | POA: Diagnosis not present

## 2018-12-08 DIAGNOSIS — F064 Anxiety disorder due to known physiological condition: Secondary | ICD-10-CM | POA: Diagnosis not present

## 2018-12-08 DIAGNOSIS — F5101 Primary insomnia: Secondary | ICD-10-CM | POA: Diagnosis not present

## 2018-12-08 DIAGNOSIS — Z20828 Contact with and (suspected) exposure to other viral communicable diseases: Secondary | ICD-10-CM | POA: Diagnosis not present

## 2018-12-15 DIAGNOSIS — Z20828 Contact with and (suspected) exposure to other viral communicable diseases: Secondary | ICD-10-CM | POA: Diagnosis not present

## 2018-12-22 NOTE — Progress Notes (Signed)
Remote pacemaker transmission.   

## 2019-01-01 DIAGNOSIS — I4891 Unspecified atrial fibrillation: Secondary | ICD-10-CM | POA: Diagnosis not present

## 2019-01-01 DIAGNOSIS — F0390 Unspecified dementia without behavioral disturbance: Secondary | ICD-10-CM | POA: Diagnosis not present

## 2019-01-01 DIAGNOSIS — J449 Chronic obstructive pulmonary disease, unspecified: Secondary | ICD-10-CM | POA: Diagnosis not present

## 2019-01-01 DIAGNOSIS — I10 Essential (primary) hypertension: Secondary | ICD-10-CM | POA: Diagnosis not present

## 2019-01-06 DIAGNOSIS — F331 Major depressive disorder, recurrent, moderate: Secondary | ICD-10-CM | POA: Diagnosis not present

## 2019-01-06 DIAGNOSIS — F064 Anxiety disorder due to known physiological condition: Secondary | ICD-10-CM | POA: Diagnosis not present

## 2019-01-06 DIAGNOSIS — Z20828 Contact with and (suspected) exposure to other viral communicable diseases: Secondary | ICD-10-CM | POA: Diagnosis not present

## 2019-01-06 DIAGNOSIS — G4701 Insomnia due to medical condition: Secondary | ICD-10-CM | POA: Diagnosis not present

## 2019-01-11 DIAGNOSIS — Z20828 Contact with and (suspected) exposure to other viral communicable diseases: Secondary | ICD-10-CM | POA: Diagnosis not present

## 2019-01-18 DIAGNOSIS — Z20828 Contact with and (suspected) exposure to other viral communicable diseases: Secondary | ICD-10-CM | POA: Diagnosis not present

## 2019-01-22 DIAGNOSIS — I4891 Unspecified atrial fibrillation: Secondary | ICD-10-CM | POA: Diagnosis not present

## 2019-01-22 DIAGNOSIS — I10 Essential (primary) hypertension: Secondary | ICD-10-CM | POA: Diagnosis not present

## 2019-01-22 DIAGNOSIS — J449 Chronic obstructive pulmonary disease, unspecified: Secondary | ICD-10-CM | POA: Diagnosis not present

## 2019-01-22 DIAGNOSIS — F0390 Unspecified dementia without behavioral disturbance: Secondary | ICD-10-CM | POA: Diagnosis not present

## 2019-01-27 DIAGNOSIS — I4891 Unspecified atrial fibrillation: Secondary | ICD-10-CM | POA: Diagnosis not present

## 2019-01-27 DIAGNOSIS — I5042 Chronic combined systolic (congestive) and diastolic (congestive) heart failure: Secondary | ICD-10-CM | POA: Diagnosis not present

## 2019-01-27 DIAGNOSIS — J449 Chronic obstructive pulmonary disease, unspecified: Secondary | ICD-10-CM | POA: Diagnosis not present

## 2019-01-27 DIAGNOSIS — U071 COVID-19: Secondary | ICD-10-CM | POA: Diagnosis not present

## 2019-02-05 IMAGING — CR DG CHEST 1V PORT
1 series · 1 of 1 positions shown · non-contrast
Comparison: 05/04/2017

CLINICAL DATA: Shortness of Breath

EXAM:
PORTABLE CHEST 1 VIEW

[AP]
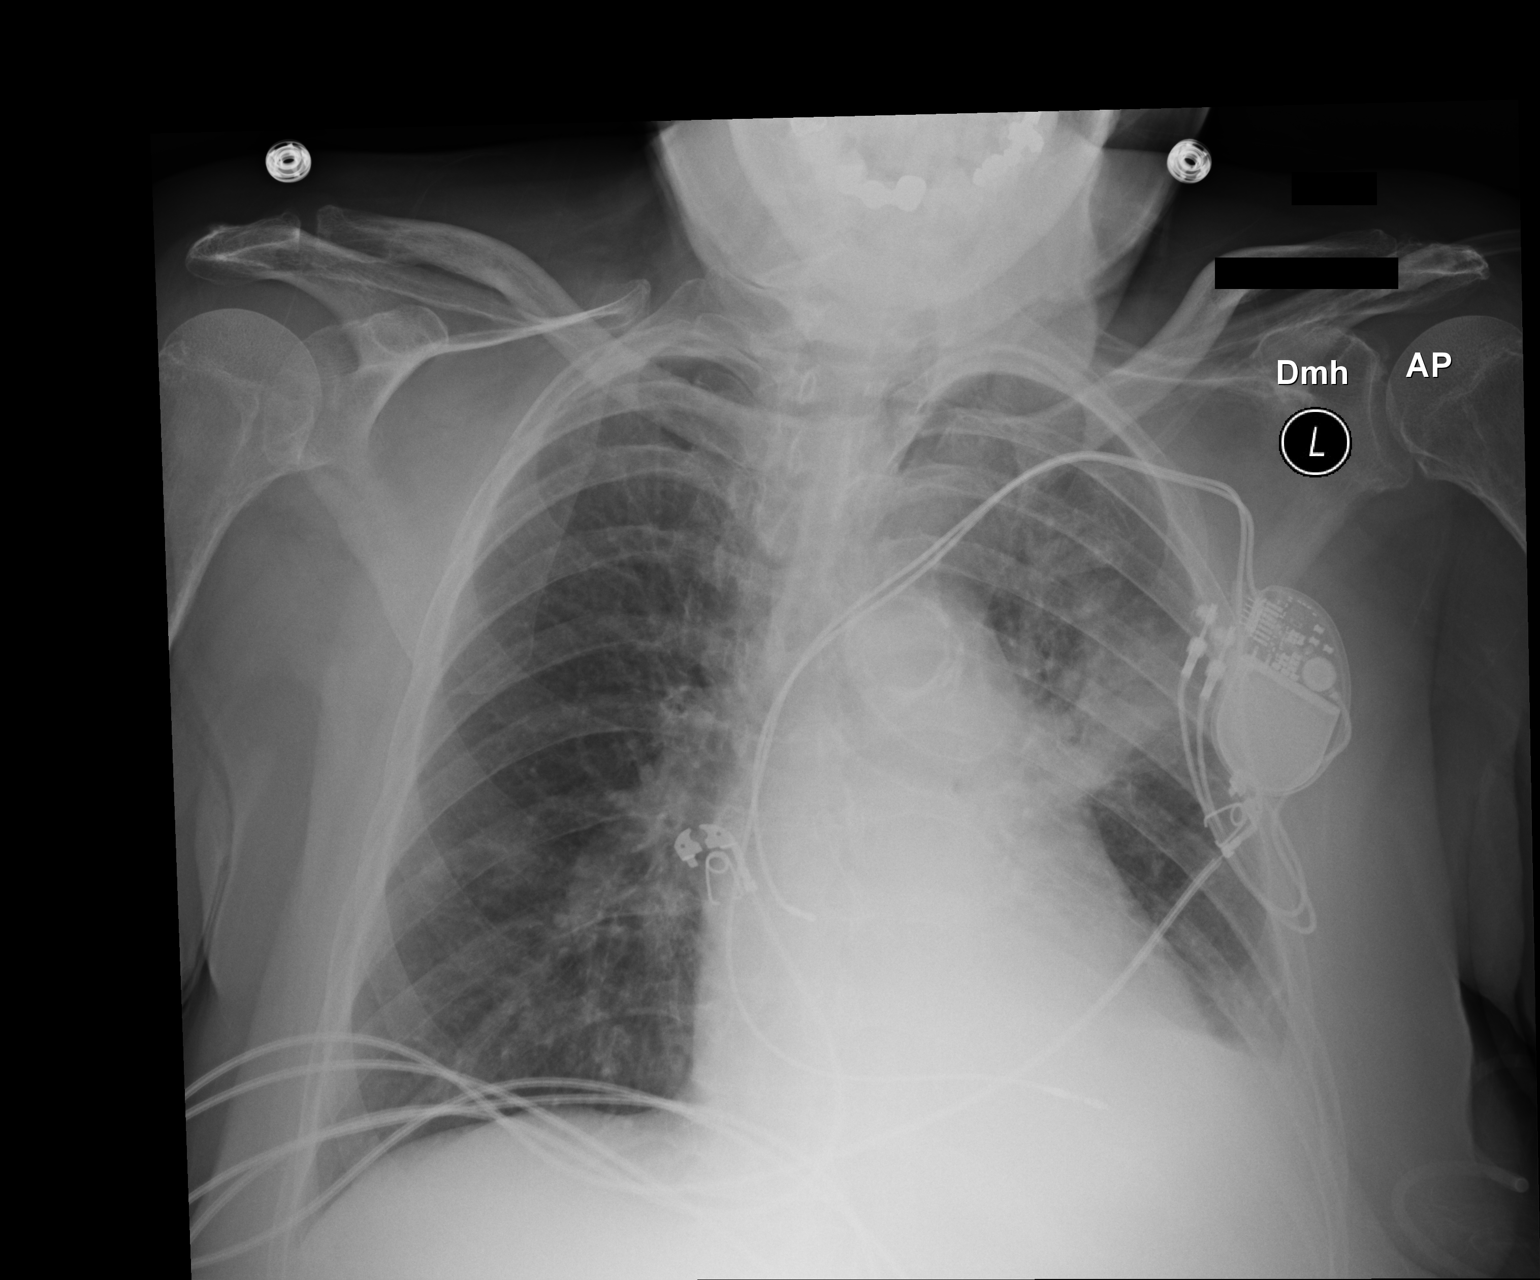

[1 of 1 positions shown; findings below may reference images not displayed]

FINDINGS: Cardiac shadow is stable. Pacing device is again seen and stable.
Persistent left perihilar infiltrate is noted. Small left pleural
effusion remains. The right lung remains clear.
IMPRESSION: Stable left-sided infiltrate and effusion.

## 2019-02-08 DIAGNOSIS — E785 Hyperlipidemia, unspecified: Secondary | ICD-10-CM | POA: Diagnosis not present

## 2019-02-08 DIAGNOSIS — I739 Peripheral vascular disease, unspecified: Secondary | ICD-10-CM | POA: Diagnosis not present

## 2019-02-08 DIAGNOSIS — J449 Chronic obstructive pulmonary disease, unspecified: Secondary | ICD-10-CM | POA: Diagnosis not present

## 2019-02-08 DIAGNOSIS — R0602 Shortness of breath: Secondary | ICD-10-CM | POA: Diagnosis not present

## 2019-02-08 DIAGNOSIS — D649 Anemia, unspecified: Secondary | ICD-10-CM | POA: Diagnosis not present

## 2019-02-08 DIAGNOSIS — R0600 Dyspnea, unspecified: Secondary | ICD-10-CM | POA: Diagnosis not present

## 2019-02-08 DIAGNOSIS — I10 Essential (primary) hypertension: Secondary | ICD-10-CM | POA: Diagnosis not present

## 2019-02-08 DIAGNOSIS — E119 Type 2 diabetes mellitus without complications: Secondary | ICD-10-CM | POA: Diagnosis not present

## 2019-02-08 DIAGNOSIS — U071 COVID-19: Secondary | ICD-10-CM | POA: Diagnosis not present

## 2019-02-09 DIAGNOSIS — I739 Peripheral vascular disease, unspecified: Secondary | ICD-10-CM | POA: Diagnosis not present

## 2019-02-09 DIAGNOSIS — J189 Pneumonia, unspecified organism: Secondary | ICD-10-CM | POA: Diagnosis not present

## 2019-02-09 DIAGNOSIS — U071 COVID-19: Secondary | ICD-10-CM | POA: Diagnosis not present

## 2019-02-09 DIAGNOSIS — J449 Chronic obstructive pulmonary disease, unspecified: Secondary | ICD-10-CM | POA: Diagnosis not present

## 2019-02-14 DIAGNOSIS — R52 Pain, unspecified: Secondary | ICD-10-CM | POA: Diagnosis not present

## 2019-02-14 DIAGNOSIS — U071 COVID-19: Secondary | ICD-10-CM | POA: Diagnosis not present

## 2019-02-14 DIAGNOSIS — I5042 Chronic combined systolic (congestive) and diastolic (congestive) heart failure: Secondary | ICD-10-CM | POA: Diagnosis not present

## 2019-02-14 DIAGNOSIS — J189 Pneumonia, unspecified organism: Secondary | ICD-10-CM | POA: Diagnosis not present

## 2019-02-14 DIAGNOSIS — I4891 Unspecified atrial fibrillation: Secondary | ICD-10-CM | POA: Diagnosis not present

## 2019-02-14 DIAGNOSIS — J449 Chronic obstructive pulmonary disease, unspecified: Secondary | ICD-10-CM | POA: Diagnosis not present

## 2019-02-14 DIAGNOSIS — I10 Essential (primary) hypertension: Secondary | ICD-10-CM | POA: Diagnosis not present

## 2019-02-14 DIAGNOSIS — F329 Major depressive disorder, single episode, unspecified: Secondary | ICD-10-CM | POA: Diagnosis not present

## 2019-02-15 DIAGNOSIS — J189 Pneumonia, unspecified organism: Secondary | ICD-10-CM | POA: Diagnosis not present

## 2019-02-15 DIAGNOSIS — J449 Chronic obstructive pulmonary disease, unspecified: Secondary | ICD-10-CM | POA: Diagnosis not present

## 2019-02-15 DIAGNOSIS — I739 Peripheral vascular disease, unspecified: Secondary | ICD-10-CM | POA: Diagnosis not present

## 2019-02-15 DIAGNOSIS — U071 COVID-19: Secondary | ICD-10-CM | POA: Diagnosis not present

## 2019-02-22 ENCOUNTER — Ambulatory Visit (INDEPENDENT_AMBULATORY_CARE_PROVIDER_SITE_OTHER): Payer: Medicare HMO | Admitting: *Deleted

## 2019-02-22 DIAGNOSIS — U071 COVID-19: Secondary | ICD-10-CM | POA: Diagnosis not present

## 2019-02-22 DIAGNOSIS — I5042 Chronic combined systolic (congestive) and diastolic (congestive) heart failure: Secondary | ICD-10-CM | POA: Diagnosis not present

## 2019-02-22 DIAGNOSIS — R7889 Finding of other specified substances, not normally found in blood: Secondary | ICD-10-CM | POA: Diagnosis not present

## 2019-02-22 DIAGNOSIS — I495 Sick sinus syndrome: Secondary | ICD-10-CM | POA: Diagnosis not present

## 2019-02-22 DIAGNOSIS — J189 Pneumonia, unspecified organism: Secondary | ICD-10-CM | POA: Diagnosis not present

## 2019-02-22 DIAGNOSIS — J449 Chronic obstructive pulmonary disease, unspecified: Secondary | ICD-10-CM | POA: Diagnosis not present

## 2019-02-22 DIAGNOSIS — D649 Anemia, unspecified: Secondary | ICD-10-CM | POA: Diagnosis not present

## 2019-02-22 LAB — CUP PACEART REMOTE DEVICE CHECK
Battery Remaining Longevity: 113 mo
Battery Remaining Percentage: 95.5 %
Battery Voltage: 2.98 V
Brady Statistic RV Percent Paced: 99 %
Date Time Interrogation Session: 20210208020012
Implantable Lead Implant Date: 20161205
Implantable Lead Implant Date: 20161205
Implantable Lead Location: 753859
Implantable Lead Location: 753860
Implantable Pulse Generator Implant Date: 20161205
Lead Channel Impedance Value: 400 Ohm
Lead Channel Pacing Threshold Amplitude: 0.75 V
Lead Channel Pacing Threshold Pulse Width: 0.4 ms
Lead Channel Sensing Intrinsic Amplitude: 6 mV
Lead Channel Setting Pacing Amplitude: 2.5 V
Lead Channel Setting Pacing Pulse Width: 0.4 ms
Lead Channel Setting Sensing Sensitivity: 4 mV
Pulse Gen Model: 2240
Pulse Gen Serial Number: 7843315

## 2019-02-23 NOTE — Progress Notes (Signed)
PPM Remote  

## 2019-03-05 DIAGNOSIS — F0390 Unspecified dementia without behavioral disturbance: Secondary | ICD-10-CM | POA: Diagnosis not present

## 2019-03-05 DIAGNOSIS — I4891 Unspecified atrial fibrillation: Secondary | ICD-10-CM | POA: Diagnosis not present

## 2019-03-05 DIAGNOSIS — I10 Essential (primary) hypertension: Secondary | ICD-10-CM | POA: Diagnosis not present

## 2019-03-05 DIAGNOSIS — J449 Chronic obstructive pulmonary disease, unspecified: Secondary | ICD-10-CM | POA: Diagnosis not present

## 2019-03-10 DIAGNOSIS — R627 Adult failure to thrive: Secondary | ICD-10-CM | POA: Diagnosis not present

## 2019-03-10 DIAGNOSIS — F331 Major depressive disorder, recurrent, moderate: Secondary | ICD-10-CM | POA: Diagnosis not present

## 2019-03-10 DIAGNOSIS — J449 Chronic obstructive pulmonary disease, unspecified: Secondary | ICD-10-CM | POA: Diagnosis not present

## 2019-03-10 DIAGNOSIS — R634 Abnormal weight loss: Secondary | ICD-10-CM | POA: Diagnosis not present

## 2019-03-10 DIAGNOSIS — I5042 Chronic combined systolic (congestive) and diastolic (congestive) heart failure: Secondary | ICD-10-CM | POA: Diagnosis not present

## 2019-03-11 DIAGNOSIS — E119 Type 2 diabetes mellitus without complications: Secondary | ICD-10-CM | POA: Diagnosis not present

## 2019-03-11 DIAGNOSIS — E785 Hyperlipidemia, unspecified: Secondary | ICD-10-CM | POA: Diagnosis not present

## 2019-03-11 DIAGNOSIS — I10 Essential (primary) hypertension: Secondary | ICD-10-CM | POA: Diagnosis not present

## 2019-03-17 DIAGNOSIS — D649 Anemia, unspecified: Secondary | ICD-10-CM | POA: Diagnosis not present

## 2019-03-17 DIAGNOSIS — E119 Type 2 diabetes mellitus without complications: Secondary | ICD-10-CM | POA: Diagnosis not present

## 2019-03-17 DIAGNOSIS — E568 Deficiency of other vitamins: Secondary | ICD-10-CM | POA: Diagnosis not present

## 2019-03-17 DIAGNOSIS — E118 Type 2 diabetes mellitus with unspecified complications: Secondary | ICD-10-CM | POA: Diagnosis not present

## 2019-04-02 DIAGNOSIS — I10 Essential (primary) hypertension: Secondary | ICD-10-CM | POA: Diagnosis not present

## 2019-04-02 DIAGNOSIS — I4891 Unspecified atrial fibrillation: Secondary | ICD-10-CM | POA: Diagnosis not present

## 2019-04-02 DIAGNOSIS — J449 Chronic obstructive pulmonary disease, unspecified: Secondary | ICD-10-CM | POA: Diagnosis not present

## 2019-04-02 DIAGNOSIS — F0390 Unspecified dementia without behavioral disturbance: Secondary | ICD-10-CM | POA: Diagnosis not present

## 2019-04-06 DIAGNOSIS — F064 Anxiety disorder due to known physiological condition: Secondary | ICD-10-CM | POA: Diagnosis not present

## 2019-04-06 DIAGNOSIS — F0391 Unspecified dementia with behavioral disturbance: Secondary | ICD-10-CM | POA: Diagnosis not present

## 2019-04-06 DIAGNOSIS — F331 Major depressive disorder, recurrent, moderate: Secondary | ICD-10-CM | POA: Diagnosis not present

## 2019-04-06 DIAGNOSIS — G4701 Insomnia due to medical condition: Secondary | ICD-10-CM | POA: Diagnosis not present

## 2019-04-30 DIAGNOSIS — Z20828 Contact with and (suspected) exposure to other viral communicable diseases: Secondary | ICD-10-CM | POA: Diagnosis not present

## 2019-05-04 DIAGNOSIS — F0391 Unspecified dementia with behavioral disturbance: Secondary | ICD-10-CM | POA: Diagnosis not present

## 2019-05-04 DIAGNOSIS — E785 Hyperlipidemia, unspecified: Secondary | ICD-10-CM | POA: Diagnosis not present

## 2019-05-04 DIAGNOSIS — E559 Vitamin D deficiency, unspecified: Secondary | ICD-10-CM | POA: Diagnosis not present

## 2019-05-04 DIAGNOSIS — I10 Essential (primary) hypertension: Secondary | ICD-10-CM | POA: Diagnosis not present

## 2019-05-04 DIAGNOSIS — F5101 Primary insomnia: Secondary | ICD-10-CM | POA: Diagnosis not present

## 2019-05-04 DIAGNOSIS — G4701 Insomnia due to medical condition: Secondary | ICD-10-CM | POA: Diagnosis not present

## 2019-05-04 DIAGNOSIS — E119 Type 2 diabetes mellitus without complications: Secondary | ICD-10-CM | POA: Diagnosis not present

## 2019-05-04 DIAGNOSIS — E039 Hypothyroidism, unspecified: Secondary | ICD-10-CM | POA: Diagnosis not present

## 2019-05-04 DIAGNOSIS — F331 Major depressive disorder, recurrent, moderate: Secondary | ICD-10-CM | POA: Diagnosis not present

## 2019-05-04 DIAGNOSIS — F064 Anxiety disorder due to known physiological condition: Secondary | ICD-10-CM | POA: Diagnosis not present

## 2019-05-04 DIAGNOSIS — D649 Anemia, unspecified: Secondary | ICD-10-CM | POA: Diagnosis not present

## 2019-05-04 DIAGNOSIS — I509 Heart failure, unspecified: Secondary | ICD-10-CM | POA: Diagnosis not present

## 2019-05-05 DIAGNOSIS — Z20828 Contact with and (suspected) exposure to other viral communicable diseases: Secondary | ICD-10-CM | POA: Diagnosis not present

## 2019-05-07 DIAGNOSIS — J449 Chronic obstructive pulmonary disease, unspecified: Secondary | ICD-10-CM | POA: Diagnosis not present

## 2019-05-07 DIAGNOSIS — I10 Essential (primary) hypertension: Secondary | ICD-10-CM | POA: Diagnosis not present

## 2019-05-07 DIAGNOSIS — I4891 Unspecified atrial fibrillation: Secondary | ICD-10-CM | POA: Diagnosis not present

## 2019-05-07 DIAGNOSIS — F0390 Unspecified dementia without behavioral disturbance: Secondary | ICD-10-CM | POA: Diagnosis not present

## 2019-05-11 DIAGNOSIS — Z20828 Contact with and (suspected) exposure to other viral communicable diseases: Secondary | ICD-10-CM | POA: Diagnosis not present

## 2019-05-17 DIAGNOSIS — Z20828 Contact with and (suspected) exposure to other viral communicable diseases: Secondary | ICD-10-CM | POA: Diagnosis not present

## 2019-05-24 ENCOUNTER — Ambulatory Visit (INDEPENDENT_AMBULATORY_CARE_PROVIDER_SITE_OTHER): Payer: Medicare HMO | Admitting: *Deleted

## 2019-05-24 DIAGNOSIS — I509 Heart failure, unspecified: Secondary | ICD-10-CM

## 2019-05-24 LAB — CUP PACEART REMOTE DEVICE CHECK
Battery Remaining Longevity: 113 mo
Battery Remaining Percentage: 95.5 %
Battery Voltage: 2.98 V
Brady Statistic RV Percent Paced: 99 %
Date Time Interrogation Session: 20210510020319
Implantable Lead Implant Date: 20161205
Implantable Lead Implant Date: 20161205
Implantable Lead Location: 753859
Implantable Lead Location: 753860
Implantable Pulse Generator Implant Date: 20161205
Lead Channel Impedance Value: 400 Ohm
Lead Channel Pacing Threshold Amplitude: 0.75 V
Lead Channel Pacing Threshold Pulse Width: 0.4 ms
Lead Channel Sensing Intrinsic Amplitude: 12 mV
Lead Channel Setting Pacing Amplitude: 2.5 V
Lead Channel Setting Pacing Pulse Width: 0.4 ms
Lead Channel Setting Sensing Sensitivity: 4 mV
Pulse Gen Model: 2240
Pulse Gen Serial Number: 7843315

## 2019-05-25 DIAGNOSIS — Z20828 Contact with and (suspected) exposure to other viral communicable diseases: Secondary | ICD-10-CM | POA: Diagnosis not present

## 2019-05-25 NOTE — Progress Notes (Signed)
Remote pacemaker transmission.   

## 2019-06-01 DIAGNOSIS — F0391 Unspecified dementia with behavioral disturbance: Secondary | ICD-10-CM | POA: Diagnosis not present

## 2019-06-01 DIAGNOSIS — F064 Anxiety disorder due to known physiological condition: Secondary | ICD-10-CM | POA: Diagnosis not present

## 2019-06-01 DIAGNOSIS — G4701 Insomnia due to medical condition: Secondary | ICD-10-CM | POA: Diagnosis not present

## 2019-06-01 DIAGNOSIS — F331 Major depressive disorder, recurrent, moderate: Secondary | ICD-10-CM | POA: Diagnosis not present

## 2019-06-01 DIAGNOSIS — F5101 Primary insomnia: Secondary | ICD-10-CM | POA: Diagnosis not present

## 2019-06-02 DIAGNOSIS — Z79899 Other long term (current) drug therapy: Secondary | ICD-10-CM | POA: Diagnosis not present

## 2019-06-02 DIAGNOSIS — N390 Urinary tract infection, site not specified: Secondary | ICD-10-CM | POA: Diagnosis not present

## 2019-06-02 DIAGNOSIS — R319 Hematuria, unspecified: Secondary | ICD-10-CM | POA: Diagnosis not present

## 2019-06-03 DIAGNOSIS — I10 Essential (primary) hypertension: Secondary | ICD-10-CM | POA: Diagnosis not present

## 2019-06-03 DIAGNOSIS — D649 Anemia, unspecified: Secondary | ICD-10-CM | POA: Diagnosis not present

## 2019-06-03 DIAGNOSIS — F329 Major depressive disorder, single episode, unspecified: Secondary | ICD-10-CM | POA: Diagnosis not present

## 2019-06-03 DIAGNOSIS — N390 Urinary tract infection, site not specified: Secondary | ICD-10-CM | POA: Diagnosis not present

## 2019-06-03 DIAGNOSIS — E785 Hyperlipidemia, unspecified: Secondary | ICD-10-CM | POA: Diagnosis not present

## 2019-06-03 DIAGNOSIS — E119 Type 2 diabetes mellitus without complications: Secondary | ICD-10-CM | POA: Diagnosis not present

## 2019-06-03 DIAGNOSIS — E039 Hypothyroidism, unspecified: Secondary | ICD-10-CM | POA: Diagnosis not present

## 2019-06-03 DIAGNOSIS — H1011 Acute atopic conjunctivitis, right eye: Secondary | ICD-10-CM | POA: Diagnosis not present

## 2019-06-04 DIAGNOSIS — I10 Essential (primary) hypertension: Secondary | ICD-10-CM | POA: Diagnosis not present

## 2019-06-04 DIAGNOSIS — J449 Chronic obstructive pulmonary disease, unspecified: Secondary | ICD-10-CM | POA: Diagnosis not present

## 2019-06-04 DIAGNOSIS — I4891 Unspecified atrial fibrillation: Secondary | ICD-10-CM | POA: Diagnosis not present

## 2019-06-04 DIAGNOSIS — F0390 Unspecified dementia without behavioral disturbance: Secondary | ICD-10-CM | POA: Diagnosis not present

## 2019-06-07 DIAGNOSIS — E1140 Type 2 diabetes mellitus with diabetic neuropathy, unspecified: Secondary | ICD-10-CM | POA: Diagnosis not present

## 2019-06-07 DIAGNOSIS — F329 Major depressive disorder, single episode, unspecified: Secondary | ICD-10-CM | POA: Diagnosis not present

## 2019-06-07 DIAGNOSIS — N390 Urinary tract infection, site not specified: Secondary | ICD-10-CM | POA: Diagnosis not present

## 2019-06-07 DIAGNOSIS — E785 Hyperlipidemia, unspecified: Secondary | ICD-10-CM | POA: Diagnosis not present

## 2019-06-07 DIAGNOSIS — D649 Anemia, unspecified: Secondary | ICD-10-CM | POA: Diagnosis not present

## 2019-06-07 DIAGNOSIS — I10 Essential (primary) hypertension: Secondary | ICD-10-CM | POA: Diagnosis not present

## 2019-06-16 DIAGNOSIS — N390 Urinary tract infection, site not specified: Secondary | ICD-10-CM | POA: Diagnosis not present

## 2019-06-16 DIAGNOSIS — L03116 Cellulitis of left lower limb: Secondary | ICD-10-CM | POA: Diagnosis not present

## 2019-06-16 DIAGNOSIS — E785 Hyperlipidemia, unspecified: Secondary | ICD-10-CM | POA: Diagnosis not present

## 2019-06-16 DIAGNOSIS — R627 Adult failure to thrive: Secondary | ICD-10-CM | POA: Diagnosis not present

## 2019-06-16 DIAGNOSIS — I5042 Chronic combined systolic (congestive) and diastolic (congestive) heart failure: Secondary | ICD-10-CM | POA: Diagnosis not present

## 2019-06-16 DIAGNOSIS — E1140 Type 2 diabetes mellitus with diabetic neuropathy, unspecified: Secondary | ICD-10-CM | POA: Diagnosis not present

## 2019-06-19 DIAGNOSIS — Z03818 Encounter for observation for suspected exposure to other biological agents ruled out: Secondary | ICD-10-CM | POA: Diagnosis not present

## 2019-06-23 ENCOUNTER — Encounter: Payer: Self-pay | Admitting: Cardiology

## 2019-06-23 DIAGNOSIS — Z20828 Contact with and (suspected) exposure to other viral communicable diseases: Secondary | ICD-10-CM | POA: Diagnosis not present

## 2019-06-28 DIAGNOSIS — F329 Major depressive disorder, single episode, unspecified: Secondary | ICD-10-CM | POA: Diagnosis not present

## 2019-06-28 DIAGNOSIS — E1140 Type 2 diabetes mellitus with diabetic neuropathy, unspecified: Secondary | ICD-10-CM | POA: Diagnosis not present

## 2019-06-28 DIAGNOSIS — I10 Essential (primary) hypertension: Secondary | ICD-10-CM | POA: Diagnosis not present

## 2019-06-28 DIAGNOSIS — I739 Peripheral vascular disease, unspecified: Secondary | ICD-10-CM | POA: Diagnosis not present

## 2019-06-28 DIAGNOSIS — L03116 Cellulitis of left lower limb: Secondary | ICD-10-CM | POA: Diagnosis not present

## 2019-06-28 DIAGNOSIS — E785 Hyperlipidemia, unspecified: Secondary | ICD-10-CM | POA: Diagnosis not present

## 2019-06-28 DIAGNOSIS — F419 Anxiety disorder, unspecified: Secondary | ICD-10-CM | POA: Diagnosis not present

## 2019-06-29 DIAGNOSIS — G4701 Insomnia due to medical condition: Secondary | ICD-10-CM | POA: Diagnosis not present

## 2019-06-29 DIAGNOSIS — F0391 Unspecified dementia with behavioral disturbance: Secondary | ICD-10-CM | POA: Diagnosis not present

## 2019-06-29 DIAGNOSIS — F064 Anxiety disorder due to known physiological condition: Secondary | ICD-10-CM | POA: Diagnosis not present

## 2019-06-29 DIAGNOSIS — F331 Major depressive disorder, recurrent, moderate: Secondary | ICD-10-CM | POA: Diagnosis not present

## 2019-06-29 DIAGNOSIS — F5101 Primary insomnia: Secondary | ICD-10-CM | POA: Diagnosis not present

## 2019-06-29 DIAGNOSIS — Z03818 Encounter for observation for suspected exposure to other biological agents ruled out: Secondary | ICD-10-CM | POA: Diagnosis not present

## 2019-07-01 ENCOUNTER — Telehealth: Payer: Self-pay | Admitting: Cardiology

## 2019-07-01 DIAGNOSIS — E785 Hyperlipidemia, unspecified: Secondary | ICD-10-CM | POA: Diagnosis not present

## 2019-07-01 DIAGNOSIS — L03116 Cellulitis of left lower limb: Secondary | ICD-10-CM | POA: Diagnosis not present

## 2019-07-01 DIAGNOSIS — E1140 Type 2 diabetes mellitus with diabetic neuropathy, unspecified: Secondary | ICD-10-CM | POA: Diagnosis not present

## 2019-07-01 DIAGNOSIS — I10 Essential (primary) hypertension: Secondary | ICD-10-CM | POA: Diagnosis not present

## 2019-07-01 DIAGNOSIS — I739 Peripheral vascular disease, unspecified: Secondary | ICD-10-CM | POA: Diagnosis not present

## 2019-07-01 NOTE — Telephone Encounter (Signed)
Appointment faxed to [facility] # [phone].Marland Kitchen

## 2019-07-01 NOTE — Telephone Encounter (Signed)
Pt's daughter called and said that the patient's facility named [facility] need documentation stating when her next appt is so that the can schedule transportation to get her there. They said you can fax it [phone].

## 2019-07-09 DIAGNOSIS — J449 Chronic obstructive pulmonary disease, unspecified: Secondary | ICD-10-CM | POA: Diagnosis not present

## 2019-07-09 DIAGNOSIS — I10 Essential (primary) hypertension: Secondary | ICD-10-CM | POA: Diagnosis not present

## 2019-07-09 DIAGNOSIS — I4891 Unspecified atrial fibrillation: Secondary | ICD-10-CM | POA: Diagnosis not present

## 2019-07-09 DIAGNOSIS — F0390 Unspecified dementia without behavioral disturbance: Secondary | ICD-10-CM | POA: Diagnosis not present

## 2019-07-12 DIAGNOSIS — E1140 Type 2 diabetes mellitus with diabetic neuropathy, unspecified: Secondary | ICD-10-CM | POA: Diagnosis not present

## 2019-07-12 DIAGNOSIS — I10 Essential (primary) hypertension: Secondary | ICD-10-CM | POA: Diagnosis not present

## 2019-07-12 DIAGNOSIS — I739 Peripheral vascular disease, unspecified: Secondary | ICD-10-CM | POA: Diagnosis not present

## 2019-07-12 DIAGNOSIS — R627 Adult failure to thrive: Secondary | ICD-10-CM | POA: Diagnosis not present

## 2019-07-12 DIAGNOSIS — L03116 Cellulitis of left lower limb: Secondary | ICD-10-CM | POA: Diagnosis not present

## 2019-07-12 DIAGNOSIS — I5042 Chronic combined systolic (congestive) and diastolic (congestive) heart failure: Secondary | ICD-10-CM | POA: Diagnosis not present

## 2019-07-13 DIAGNOSIS — S91104A Unspecified open wound of right lesser toe(s) without damage to nail, initial encounter: Secondary | ICD-10-CM | POA: Diagnosis not present

## 2019-07-13 DIAGNOSIS — E11621 Type 2 diabetes mellitus with foot ulcer: Secondary | ICD-10-CM | POA: Diagnosis not present

## 2019-07-13 DIAGNOSIS — S91301A Unspecified open wound, right foot, initial encounter: Secondary | ICD-10-CM | POA: Diagnosis not present

## 2019-07-13 DIAGNOSIS — L97509 Non-pressure chronic ulcer of other part of unspecified foot with unspecified severity: Secondary | ICD-10-CM | POA: Diagnosis not present

## 2019-07-15 DIAGNOSIS — R402431 Glasgow coma scale score 3-8, in the field [EMT or ambulance]: Secondary | ICD-10-CM | POA: Diagnosis not present

## 2019-07-15 DIAGNOSIS — R404 Transient alteration of awareness: Secondary | ICD-10-CM | POA: Diagnosis not present

## 2019-07-15 DIAGNOSIS — I499 Cardiac arrhythmia, unspecified: Secondary | ICD-10-CM | POA: Diagnosis not present

## 2019-07-15 DIAGNOSIS — R4020 Unspecified coma: Secondary | ICD-10-CM | POA: Diagnosis not present

## 2019-07-21 ENCOUNTER — Telehealth: Payer: Self-pay

## 2019-07-21 NOTE — Telephone Encounter (Signed)
Patient daughter tina called to let us know her mom died August 13, 2019 and she wants to know what arrhythmias were seen on her PPM since they are ruling her cause of death due to a arrhythmia.

## 2019-07-22 NOTE — Telephone Encounter (Signed)
Last PPM transmission was received on 05/24/19 at 02:03 and showed normal PPM function.  No alert transmissions received since that time.  Spoke with Sarah Moss to offer my condolences and advise of transmission results. She verbalizes understanding and thanked me for my call.  Routed to Dr. Caryl Comes as Juluis Rainier. Unenrolled from [employer].

## 2019-08-02 NOTE — Telephone Encounter (Signed)
Noted Thanks SK   

## 2019-08-15 DIAGNOSIS — 419620001 Death: Secondary | SNOMED CT | POA: Diagnosis not present

## 2019-08-15 DEATH — deceased

## 2019-09-09 ENCOUNTER — Ambulatory Visit: Payer: Medicare HMO | Admitting: Cardiology
